# Patient Record
Sex: Female | Born: 1970 | Race: White | Hispanic: No | Marital: Single | Smoking: Current every day smoker
Health system: Southern US, Community
[De-identification: ages and names within clinical notes are randomized; demographics above are authoritative.]

## PROBLEM LIST (undated history)

## (undated) DIAGNOSIS — F329 Major depressive disorder, single episode, unspecified: Secondary | ICD-10-CM

## (undated) DIAGNOSIS — N750 Cyst of Bartholin's gland: Secondary | ICD-10-CM

## (undated) DIAGNOSIS — L0291 Cutaneous abscess, unspecified: Secondary | ICD-10-CM

## (undated) DIAGNOSIS — F32A Depression, unspecified: Secondary | ICD-10-CM

## (undated) DIAGNOSIS — J40 Bronchitis, not specified as acute or chronic: Secondary | ICD-10-CM

## (undated) DIAGNOSIS — F419 Anxiety disorder, unspecified: Secondary | ICD-10-CM

## (undated) HISTORY — DX: Cyst of Bartholin's gland: N75.0

## (undated) HISTORY — PX: WISDOM TOOTH EXTRACTION: SHX21

## (undated) HISTORY — PX: OTHER SURGICAL HISTORY: SHX169

## (undated) HISTORY — PX: CARPAL TUNNEL RELEASE: SHX101

## (undated) HISTORY — PX: TUBAL LIGATION: SHX77

## (undated) HISTORY — PX: LEEP: SHX91

---

## 1997-10-23 ENCOUNTER — Inpatient Hospital Stay (HOSPITAL_COMMUNITY): Admission: AD | Admit: 1997-10-23 | Discharge: 1997-10-23 | Payer: Self-pay | Admitting: *Deleted

## 1997-11-15 ENCOUNTER — Ambulatory Visit (HOSPITAL_COMMUNITY): Admission: RE | Admit: 1997-11-15 | Discharge: 1997-11-15 | Payer: Self-pay | Admitting: Obstetrics

## 1997-12-14 ENCOUNTER — Ambulatory Visit (HOSPITAL_COMMUNITY): Admission: RE | Admit: 1997-12-14 | Discharge: 1997-12-14 | Payer: Self-pay | Admitting: Obstetrics & Gynecology

## 1998-01-22 ENCOUNTER — Ambulatory Visit (HOSPITAL_COMMUNITY): Admission: RE | Admit: 1998-01-22 | Discharge: 1998-01-22 | Payer: Self-pay | Admitting: Obstetrics

## 1998-03-03 ENCOUNTER — Inpatient Hospital Stay (HOSPITAL_COMMUNITY): Admission: AD | Admit: 1998-03-03 | Discharge: 1998-03-06 | Payer: Self-pay | Admitting: Obstetrics

## 1998-03-06 ENCOUNTER — Inpatient Hospital Stay (HOSPITAL_COMMUNITY): Admission: AD | Admit: 1998-03-06 | Discharge: 1998-03-09 | Payer: Self-pay | Admitting: Obstetrics

## 1998-03-14 ENCOUNTER — Inpatient Hospital Stay (HOSPITAL_COMMUNITY): Admission: AD | Admit: 1998-03-14 | Discharge: 1998-03-14 | Payer: Self-pay | Admitting: Obstetrics & Gynecology

## 1998-04-14 ENCOUNTER — Emergency Department (HOSPITAL_COMMUNITY): Admission: EM | Admit: 1998-04-14 | Discharge: 1998-04-14 | Payer: Self-pay | Admitting: Emergency Medicine

## 1998-04-19 ENCOUNTER — Encounter: Payer: Self-pay | Admitting: Emergency Medicine

## 1998-04-19 ENCOUNTER — Emergency Department (HOSPITAL_COMMUNITY): Admission: EM | Admit: 1998-04-19 | Discharge: 1998-04-19 | Payer: Self-pay | Admitting: Emergency Medicine

## 1998-05-26 ENCOUNTER — Emergency Department (HOSPITAL_COMMUNITY): Admission: EM | Admit: 1998-05-26 | Discharge: 1998-05-26 | Payer: Self-pay | Admitting: Emergency Medicine

## 1998-06-03 ENCOUNTER — Emergency Department (HOSPITAL_COMMUNITY): Admission: EM | Admit: 1998-06-03 | Discharge: 1998-06-03 | Payer: Self-pay | Admitting: Emergency Medicine

## 1998-06-10 ENCOUNTER — Emergency Department (HOSPITAL_COMMUNITY): Admission: EM | Admit: 1998-06-10 | Discharge: 1998-06-10 | Payer: Self-pay | Admitting: Emergency Medicine

## 1998-06-19 ENCOUNTER — Emergency Department (HOSPITAL_COMMUNITY): Admission: EM | Admit: 1998-06-19 | Discharge: 1998-06-19 | Payer: Self-pay | Admitting: Emergency Medicine

## 1998-07-05 ENCOUNTER — Encounter: Payer: Self-pay | Admitting: Emergency Medicine

## 1998-07-05 ENCOUNTER — Emergency Department (HOSPITAL_COMMUNITY): Admission: EM | Admit: 1998-07-05 | Discharge: 1998-07-05 | Payer: Self-pay | Admitting: Emergency Medicine

## 1998-07-08 ENCOUNTER — Emergency Department (HOSPITAL_COMMUNITY): Admission: EM | Admit: 1998-07-08 | Discharge: 1998-07-08 | Payer: Self-pay | Admitting: Emergency Medicine

## 1998-07-08 ENCOUNTER — Encounter: Payer: Self-pay | Admitting: Emergency Medicine

## 1998-08-19 ENCOUNTER — Ambulatory Visit (HOSPITAL_COMMUNITY): Admission: RE | Admit: 1998-08-19 | Discharge: 1998-08-19 | Payer: Self-pay | Admitting: General Surgery

## 1998-09-13 ENCOUNTER — Emergency Department (HOSPITAL_COMMUNITY): Admission: EM | Admit: 1998-09-13 | Discharge: 1998-09-13 | Payer: Self-pay | Admitting: Emergency Medicine

## 1998-09-19 ENCOUNTER — Emergency Department (HOSPITAL_COMMUNITY): Admission: EM | Admit: 1998-09-19 | Discharge: 1998-09-19 | Payer: Self-pay | Admitting: Emergency Medicine

## 1998-11-20 ENCOUNTER — Ambulatory Visit (HOSPITAL_COMMUNITY): Admission: RE | Admit: 1998-11-20 | Discharge: 1998-11-20 | Payer: Self-pay | Admitting: *Deleted

## 1998-11-25 ENCOUNTER — Emergency Department (HOSPITAL_COMMUNITY): Admission: EM | Admit: 1998-11-25 | Discharge: 1998-11-25 | Payer: Self-pay | Admitting: Emergency Medicine

## 1998-12-17 ENCOUNTER — Emergency Department (HOSPITAL_COMMUNITY): Admission: EM | Admit: 1998-12-17 | Discharge: 1998-12-17 | Payer: Self-pay | Admitting: Emergency Medicine

## 2001-06-18 ENCOUNTER — Emergency Department (HOSPITAL_COMMUNITY): Admission: EM | Admit: 2001-06-18 | Discharge: 2001-06-18 | Payer: Self-pay | Admitting: Emergency Medicine

## 2001-09-14 ENCOUNTER — Emergency Department (HOSPITAL_COMMUNITY): Admission: EM | Admit: 2001-09-14 | Discharge: 2001-09-14 | Payer: Self-pay | Admitting: Emergency Medicine

## 2003-01-31 ENCOUNTER — Encounter: Payer: Self-pay | Admitting: Emergency Medicine

## 2003-01-31 ENCOUNTER — Emergency Department (HOSPITAL_COMMUNITY): Admission: EM | Admit: 2003-01-31 | Discharge: 2003-01-31 | Payer: Self-pay | Admitting: Emergency Medicine

## 2003-04-24 ENCOUNTER — Emergency Department (HOSPITAL_COMMUNITY): Admission: EM | Admit: 2003-04-24 | Discharge: 2003-04-24 | Payer: Self-pay | Admitting: Emergency Medicine

## 2003-05-01 ENCOUNTER — Emergency Department (HOSPITAL_COMMUNITY): Admission: EM | Admit: 2003-05-01 | Discharge: 2003-05-01 | Payer: Self-pay | Admitting: Emergency Medicine

## 2003-06-30 ENCOUNTER — Emergency Department (HOSPITAL_COMMUNITY): Admission: EM | Admit: 2003-06-30 | Discharge: 2003-06-30 | Payer: Self-pay | Admitting: Emergency Medicine

## 2003-09-12 ENCOUNTER — Emergency Department (HOSPITAL_COMMUNITY): Admission: EM | Admit: 2003-09-12 | Discharge: 2003-09-12 | Payer: Self-pay | Admitting: Emergency Medicine

## 2004-02-11 ENCOUNTER — Emergency Department (HOSPITAL_COMMUNITY): Admission: EM | Admit: 2004-02-11 | Discharge: 2004-02-11 | Payer: Self-pay | Admitting: Emergency Medicine

## 2005-05-10 ENCOUNTER — Emergency Department (HOSPITAL_COMMUNITY): Admission: EM | Admit: 2005-05-10 | Discharge: 2005-05-10 | Payer: Self-pay

## 2005-05-12 ENCOUNTER — Emergency Department (HOSPITAL_COMMUNITY): Admission: EM | Admit: 2005-05-12 | Discharge: 2005-05-12 | Payer: Self-pay | Admitting: *Deleted

## 2005-06-15 ENCOUNTER — Emergency Department (HOSPITAL_COMMUNITY): Admission: EM | Admit: 2005-06-15 | Discharge: 2005-06-15 | Payer: Self-pay | Admitting: Emergency Medicine

## 2005-06-28 ENCOUNTER — Emergency Department (HOSPITAL_COMMUNITY): Admission: EM | Admit: 2005-06-28 | Discharge: 2005-06-28 | Payer: Self-pay | Admitting: Emergency Medicine

## 2005-07-07 ENCOUNTER — Emergency Department (HOSPITAL_COMMUNITY): Admission: EM | Admit: 2005-07-07 | Discharge: 2005-07-07 | Payer: Self-pay | Admitting: Family Medicine

## 2005-07-30 ENCOUNTER — Emergency Department (HOSPITAL_COMMUNITY): Admission: EM | Admit: 2005-07-30 | Discharge: 2005-07-30 | Payer: Self-pay | Admitting: Emergency Medicine

## 2005-10-10 ENCOUNTER — Emergency Department (HOSPITAL_COMMUNITY): Admission: EM | Admit: 2005-10-10 | Discharge: 2005-10-10 | Payer: Self-pay | Admitting: Emergency Medicine

## 2006-02-26 ENCOUNTER — Emergency Department (HOSPITAL_COMMUNITY): Admission: EM | Admit: 2006-02-26 | Discharge: 2006-02-26 | Payer: Self-pay | Admitting: Family Medicine

## 2006-09-21 ENCOUNTER — Emergency Department (HOSPITAL_COMMUNITY): Admission: EM | Admit: 2006-09-21 | Discharge: 2006-09-21 | Payer: Self-pay | Admitting: Emergency Medicine

## 2007-03-10 ENCOUNTER — Emergency Department: Payer: Self-pay | Admitting: Internal Medicine

## 2007-03-31 ENCOUNTER — Emergency Department: Payer: Self-pay | Admitting: Emergency Medicine

## 2007-04-25 ENCOUNTER — Emergency Department (HOSPITAL_COMMUNITY): Admission: EM | Admit: 2007-04-25 | Discharge: 2007-04-25 | Payer: Self-pay | Admitting: Emergency Medicine

## 2007-05-11 ENCOUNTER — Emergency Department: Payer: Self-pay | Admitting: Emergency Medicine

## 2008-09-26 ENCOUNTER — Emergency Department (HOSPITAL_COMMUNITY): Admission: EM | Admit: 2008-09-26 | Discharge: 2008-09-26 | Payer: Self-pay | Admitting: Emergency Medicine

## 2008-10-10 ENCOUNTER — Emergency Department (HOSPITAL_COMMUNITY): Admission: EM | Admit: 2008-10-10 | Discharge: 2008-10-10 | Payer: Self-pay | Admitting: Emergency Medicine

## 2008-10-22 ENCOUNTER — Emergency Department (HOSPITAL_COMMUNITY): Admission: EM | Admit: 2008-10-22 | Discharge: 2008-10-22 | Payer: Self-pay | Admitting: Emergency Medicine

## 2008-11-10 ENCOUNTER — Emergency Department (HOSPITAL_COMMUNITY): Admission: EM | Admit: 2008-11-10 | Discharge: 2008-11-10 | Payer: Self-pay | Admitting: Emergency Medicine

## 2008-11-13 ENCOUNTER — Emergency Department (HOSPITAL_COMMUNITY): Admission: EM | Admit: 2008-11-13 | Discharge: 2008-11-13 | Payer: Self-pay | Admitting: Emergency Medicine

## 2008-12-11 ENCOUNTER — Emergency Department (HOSPITAL_COMMUNITY): Admission: EM | Admit: 2008-12-11 | Discharge: 2008-12-11 | Payer: Self-pay | Admitting: Emergency Medicine

## 2009-01-16 ENCOUNTER — Inpatient Hospital Stay (HOSPITAL_COMMUNITY): Admission: AD | Admit: 2009-01-16 | Discharge: 2009-01-16 | Payer: Self-pay | Admitting: Obstetrics & Gynecology

## 2009-01-16 ENCOUNTER — Emergency Department (HOSPITAL_COMMUNITY): Admission: EM | Admit: 2009-01-16 | Discharge: 2009-01-16 | Payer: Self-pay | Admitting: Emergency Medicine

## 2009-01-16 ENCOUNTER — Ambulatory Visit: Payer: Self-pay | Admitting: Obstetrics and Gynecology

## 2009-01-26 ENCOUNTER — Emergency Department (HOSPITAL_COMMUNITY): Admission: EM | Admit: 2009-01-26 | Discharge: 2009-01-26 | Payer: Self-pay | Admitting: Emergency Medicine

## 2009-01-30 ENCOUNTER — Ambulatory Visit: Payer: Self-pay | Admitting: Obstetrics and Gynecology

## 2009-02-16 ENCOUNTER — Emergency Department (HOSPITAL_COMMUNITY): Admission: EM | Admit: 2009-02-16 | Discharge: 2009-02-16 | Payer: Self-pay | Admitting: Emergency Medicine

## 2009-03-29 ENCOUNTER — Inpatient Hospital Stay (HOSPITAL_COMMUNITY): Admission: AD | Admit: 2009-03-29 | Discharge: 2009-03-29 | Payer: Self-pay | Admitting: Obstetrics & Gynecology

## 2009-06-08 ENCOUNTER — Emergency Department: Payer: Self-pay | Admitting: Internal Medicine

## 2009-06-10 ENCOUNTER — Emergency Department: Payer: Self-pay | Admitting: Emergency Medicine

## 2009-06-26 ENCOUNTER — Emergency Department (HOSPITAL_COMMUNITY): Admission: EM | Admit: 2009-06-26 | Discharge: 2009-06-26 | Payer: Self-pay | Admitting: Emergency Medicine

## 2009-08-03 ENCOUNTER — Emergency Department (HOSPITAL_COMMUNITY): Admission: EM | Admit: 2009-08-03 | Discharge: 2009-08-03 | Payer: Self-pay | Admitting: Emergency Medicine

## 2009-08-06 ENCOUNTER — Emergency Department (HOSPITAL_COMMUNITY): Admission: EM | Admit: 2009-08-06 | Discharge: 2009-08-06 | Payer: Self-pay | Admitting: Emergency Medicine

## 2009-08-08 ENCOUNTER — Emergency Department (HOSPITAL_COMMUNITY): Admission: EM | Admit: 2009-08-08 | Discharge: 2009-08-08 | Payer: Self-pay | Admitting: Emergency Medicine

## 2009-08-21 ENCOUNTER — Emergency Department (HOSPITAL_COMMUNITY): Admission: EM | Admit: 2009-08-21 | Discharge: 2009-08-21 | Payer: Self-pay | Admitting: Emergency Medicine

## 2009-09-11 ENCOUNTER — Ambulatory Visit: Payer: Self-pay | Admitting: Obstetrics & Gynecology

## 2009-12-12 ENCOUNTER — Emergency Department (HOSPITAL_COMMUNITY): Admission: EM | Admit: 2009-12-12 | Discharge: 2009-12-12 | Payer: Self-pay | Admitting: Emergency Medicine

## 2010-03-06 ENCOUNTER — Emergency Department (HOSPITAL_COMMUNITY): Admission: EM | Admit: 2010-03-06 | Discharge: 2010-03-06 | Payer: Self-pay | Admitting: Emergency Medicine

## 2010-03-14 ENCOUNTER — Emergency Department (HOSPITAL_COMMUNITY): Admission: EM | Admit: 2010-03-14 | Discharge: 2010-03-14 | Payer: Self-pay | Admitting: Emergency Medicine

## 2010-10-10 ENCOUNTER — Ambulatory Visit: Payer: Self-pay | Admitting: Obstetrics and Gynecology

## 2010-10-10 ENCOUNTER — Emergency Department (HOSPITAL_COMMUNITY)
Admission: EM | Admit: 2010-10-10 | Discharge: 2010-10-10 | Disposition: A | Discharge status: Home / Self Care | Payer: Self-pay | Attending: Emergency Medicine | Admitting: Emergency Medicine

## 2010-10-10 DIAGNOSIS — N949 Unspecified condition associated with female genital organs and menstrual cycle: Secondary | ICD-10-CM | POA: Insufficient documentation

## 2010-10-10 DIAGNOSIS — F329 Major depressive disorder, single episode, unspecified: Secondary | ICD-10-CM | POA: Insufficient documentation

## 2010-10-10 DIAGNOSIS — J45909 Unspecified asthma, uncomplicated: Secondary | ICD-10-CM | POA: Insufficient documentation

## 2010-10-10 DIAGNOSIS — N751 Abscess of Bartholin's gland: Secondary | ICD-10-CM | POA: Insufficient documentation

## 2010-10-10 DIAGNOSIS — F3289 Other specified depressive episodes: Secondary | ICD-10-CM | POA: Insufficient documentation

## 2010-10-10 DIAGNOSIS — N9489 Other specified conditions associated with female genital organs and menstrual cycle: Secondary | ICD-10-CM | POA: Insufficient documentation

## 2010-10-13 ENCOUNTER — Emergency Department (HOSPITAL_COMMUNITY)
Admission: EM | Admit: 2010-10-13 | Discharge: 2010-10-13 | Discharge status: Against Medical Advice | Payer: Self-pay | Attending: Emergency Medicine | Admitting: Emergency Medicine

## 2010-10-13 DIAGNOSIS — R109 Unspecified abdominal pain: Secondary | ICD-10-CM | POA: Insufficient documentation

## 2010-10-17 ENCOUNTER — Inpatient Hospital Stay (HOSPITAL_COMMUNITY)
Admission: AD | Admit: 2010-10-17 | Discharge: 2010-10-17 | Disposition: A | Discharge status: Home / Self Care | Payer: Self-pay | Source: Ambulatory Visit | Attending: Obstetrics & Gynecology | Admitting: Obstetrics & Gynecology

## 2010-10-17 DIAGNOSIS — N751 Abscess of Bartholin's gland: Secondary | ICD-10-CM

## 2010-11-01 LAB — URINALYSIS, ROUTINE W REFLEX MICROSCOPIC
Bilirubin Urine: NEGATIVE
Glucose, UA: NEGATIVE mg/dL
Hgb urine dipstick: NEGATIVE
Ketones, ur: NEGATIVE mg/dL
Nitrite: NEGATIVE
Protein, ur: NEGATIVE mg/dL
Specific Gravity, Urine: 1.028 (ref 1.005–1.030)
Urobilinogen, UA: 0.2 mg/dL (ref 0.0–1.0)
pH: 6 (ref 5.0–8.0)

## 2010-11-01 LAB — WOUND CULTURE: Culture: NO GROWTH

## 2010-11-01 LAB — POCT PREGNANCY, URINE: Preg Test, Ur: NEGATIVE

## 2010-11-02 LAB — GC/CHLAMYDIA PROBE AMP, GENITAL
Chlamydia, DNA Probe: NEGATIVE
GC Probe Amp, Genital: NEGATIVE

## 2010-11-02 LAB — WET PREP, GENITAL
Clue Cells Wet Prep HPF POC: NONE SEEN
Yeast Wet Prep HPF POC: NONE SEEN

## 2010-11-02 LAB — DIFFERENTIAL
Basophils Absolute: 0.1 K/uL (ref 0.0–0.1)
Basophils Relative: 1 % (ref 0–1)
Eosinophils Absolute: 0.1 K/uL (ref 0.0–0.7)
Eosinophils Relative: 1 % (ref 0–5)
Lymphocytes Relative: 36 % (ref 12–46)
Lymphs Abs: 5 K/uL — ABNORMAL HIGH (ref 0.7–4.0)
Monocytes Absolute: 0.8 K/uL (ref 0.1–1.0)
Monocytes Relative: 6 % (ref 3–12)
Neutro Abs: 8 K/uL — ABNORMAL HIGH (ref 1.7–7.7)
Neutrophils Relative %: 57 % (ref 43–77)

## 2010-11-02 LAB — CBC
HCT: 42.7 % (ref 36.0–46.0)
Hemoglobin: 14.6 g/dL (ref 12.0–15.0)
MCHC: 34.2 g/dL (ref 30.0–36.0)
MCV: 94.5 fL (ref 78.0–100.0)
Platelets: 258 K/uL (ref 150–400)
RBC: 4.52 MIL/uL (ref 3.87–5.11)
RDW: 13.7 % (ref 11.5–15.5)
WBC: 14 K/uL — ABNORMAL HIGH (ref 4.0–10.5)

## 2010-11-02 LAB — POCT I-STAT, CHEM 8
BUN: 5 mg/dL — ABNORMAL LOW (ref 6–23)
Calcium, Ion: 1.15 mmol/L (ref 1.12–1.32)
Chloride: 104 mEq/L (ref 96–112)
Creatinine, Ser: 0.5 mg/dL (ref 0.4–1.2)
Glucose, Bld: 87 mg/dL (ref 70–99)
HCT: 44 % (ref 36.0–46.0)
Hemoglobin: 15 g/dL (ref 12.0–15.0)
Potassium: 3.4 mEq/L — ABNORMAL LOW (ref 3.5–5.1)
Sodium: 140 mEq/L (ref 135–145)
TCO2: 30 mmol/L (ref 0–100)

## 2010-11-02 LAB — URINALYSIS, ROUTINE W REFLEX MICROSCOPIC
Bilirubin Urine: NEGATIVE
Glucose, UA: NEGATIVE mg/dL
Ketones, ur: NEGATIVE mg/dL
Nitrite: NEGATIVE
Protein, ur: NEGATIVE mg/dL
Specific Gravity, Urine: 1.013 (ref 1.005–1.030)
Urobilinogen, UA: 0.2 mg/dL (ref 0.0–1.0)
pH: 7 (ref 5.0–8.0)

## 2010-11-02 LAB — URINE MICROSCOPIC-ADD ON

## 2010-11-02 LAB — RPR: RPR Ser Ql: NONREACTIVE

## 2010-11-02 LAB — POCT PREGNANCY, URINE: Preg Test, Ur: NEGATIVE

## 2010-11-14 ENCOUNTER — Encounter: Payer: Self-pay | Admitting: Obstetrics & Gynecology

## 2010-11-17 LAB — URINE MICROSCOPIC-ADD ON

## 2010-11-17 LAB — URINALYSIS, ROUTINE W REFLEX MICROSCOPIC
Glucose, UA: NEGATIVE mg/dL
Nitrite: NEGATIVE
Protein, ur: NEGATIVE mg/dL
Specific Gravity, Urine: 1.029 (ref 1.005–1.030)
Urobilinogen, UA: 1 mg/dL (ref 0.0–1.0)
pH: 6 (ref 5.0–8.0)

## 2010-11-19 LAB — URINALYSIS, ROUTINE W REFLEX MICROSCOPIC
Bilirubin Urine: NEGATIVE
Glucose, UA: NEGATIVE mg/dL
Hgb urine dipstick: NEGATIVE
Ketones, ur: NEGATIVE mg/dL
Nitrite: NEGATIVE
Protein, ur: NEGATIVE mg/dL
Specific Gravity, Urine: 1.006 (ref 1.005–1.030)
Urobilinogen, UA: 0.2 mg/dL (ref 0.0–1.0)
pH: 7.5 (ref 5.0–8.0)

## 2010-11-19 LAB — POCT I-STAT, CHEM 8
BUN: 6 mg/dL (ref 6–23)
Calcium, Ion: 1.09 mmol/L — ABNORMAL LOW (ref 1.12–1.32)
Chloride: 104 mEq/L (ref 96–112)
Creatinine, Ser: 0.6 mg/dL (ref 0.4–1.2)
Glucose, Bld: 88 mg/dL (ref 70–99)
HCT: 44 % (ref 36.0–46.0)
Hemoglobin: 15 g/dL (ref 12.0–15.0)
Potassium: 4.5 mEq/L (ref 3.5–5.1)
Sodium: 138 mEq/L (ref 135–145)
TCO2: 25 mmol/L (ref 0–100)

## 2010-11-19 LAB — PREGNANCY, URINE: Preg Test, Ur: NEGATIVE

## 2010-11-22 LAB — URINALYSIS, ROUTINE W REFLEX MICROSCOPIC
Bilirubin Urine: NEGATIVE
Glucose, UA: NEGATIVE mg/dL
Hgb urine dipstick: NEGATIVE
Ketones, ur: 15 mg/dL — AB
Nitrite: NEGATIVE
Protein, ur: NEGATIVE mg/dL
Specific Gravity, Urine: 1.025 (ref 1.005–1.030)
Urobilinogen, UA: 0.2 mg/dL (ref 0.0–1.0)
pH: 5.5 (ref 5.0–8.0)

## 2010-11-22 LAB — CBC
HCT: 39.3 % (ref 36.0–46.0)
Hemoglobin: 13.5 g/dL (ref 12.0–15.0)
MCHC: 34.3 g/dL (ref 30.0–36.0)
MCV: 94.6 fL (ref 78.0–100.0)
Platelets: 240 10*3/uL (ref 150–400)
RBC: 4.15 MIL/uL (ref 3.87–5.11)
RDW: 13.3 % (ref 11.5–15.5)
WBC: 8.1 10*3/uL (ref 4.0–10.5)

## 2010-11-22 LAB — WET PREP, GENITAL
Trich, Wet Prep: NONE SEEN
Yeast Wet Prep HPF POC: NONE SEEN

## 2010-11-22 LAB — POCT PREGNANCY, URINE: Preg Test, Ur: NEGATIVE

## 2010-11-22 LAB — GC/CHLAMYDIA PROBE AMP, GENITAL
Chlamydia, DNA Probe: POSITIVE — AB
GC Probe Amp, Genital: NEGATIVE

## 2010-11-26 LAB — URINALYSIS, ROUTINE W REFLEX MICROSCOPIC
Bilirubin Urine: NEGATIVE
Glucose, UA: NEGATIVE mg/dL
Hgb urine dipstick: NEGATIVE
Ketones, ur: NEGATIVE mg/dL
Nitrite: NEGATIVE
Protein, ur: NEGATIVE mg/dL
Specific Gravity, Urine: 1.015 (ref 1.005–1.030)
Urobilinogen, UA: 0.2 mg/dL (ref 0.0–1.0)
pH: 6.5 (ref 5.0–8.0)

## 2010-11-26 LAB — GC/CHLAMYDIA PROBE AMP, GENITAL
Chlamydia, DNA Probe: NEGATIVE
GC Probe Amp, Genital: NEGATIVE

## 2010-11-26 LAB — WET PREP, GENITAL
Trich, Wet Prep: NONE SEEN
WBC, Wet Prep HPF POC: NONE SEEN
Yeast Wet Prep HPF POC: NONE SEEN

## 2010-11-26 LAB — POCT PREGNANCY, URINE: Preg Test, Ur: NEGATIVE

## 2010-11-27 ENCOUNTER — Encounter: Payer: Self-pay | Admitting: Obstetrics and Gynecology

## 2010-12-08 ENCOUNTER — Encounter: Payer: Self-pay | Admitting: Family Medicine

## 2010-12-08 ENCOUNTER — Inpatient Hospital Stay (HOSPITAL_COMMUNITY)
Admission: AD | Admit: 2010-12-08 | Discharge: 2010-12-08 | Disposition: A | Discharge status: Home / Self Care | Payer: Self-pay | Source: Ambulatory Visit | Attending: Obstetrics & Gynecology | Admitting: Obstetrics & Gynecology

## 2010-12-08 DIAGNOSIS — L02219 Cutaneous abscess of trunk, unspecified: Secondary | ICD-10-CM

## 2010-12-08 DIAGNOSIS — L03319 Cellulitis of trunk, unspecified: Secondary | ICD-10-CM | POA: Insufficient documentation

## 2010-12-10 ENCOUNTER — Observation Stay (HOSPITAL_COMMUNITY)
Admission: EM | Admit: 2010-12-10 | Discharge: 2010-12-10 | Disposition: A | Discharge status: Home / Self Care | Payer: Self-pay | Attending: Emergency Medicine | Admitting: Emergency Medicine

## 2010-12-10 DIAGNOSIS — F17200 Nicotine dependence, unspecified, uncomplicated: Secondary | ICD-10-CM | POA: Insufficient documentation

## 2010-12-10 DIAGNOSIS — L02219 Cutaneous abscess of trunk, unspecified: Principal | ICD-10-CM | POA: Insufficient documentation

## 2010-12-10 LAB — BASIC METABOLIC PANEL
BUN: 6 mg/dL (ref 6–23)
CO2: 26 mEq/L (ref 19–32)
Calcium: 8.8 mg/dL (ref 8.4–10.5)
Chloride: 109 mEq/L (ref 96–112)
Creatinine, Ser: 0.52 mg/dL (ref 0.4–1.2)
GFR calc Af Amer: >60 mL/min (ref >60)
GFR calc non Af Amer: >60 mL/min (ref >60)
Glucose, Bld: 85 mg/dL (ref 70–99)
Potassium: 4.1 mEq/L (ref 3.5–5.1)
Sodium: 139 mEq/L (ref 135–145)

## 2010-12-10 LAB — CBC
HCT: 40.2 % (ref 36.0–46.0)
Hemoglobin: 13.5 g/dL (ref 12.0–15.0)
MCH: 31.5 pg (ref 26.0–34.0)
MCHC: 33.6 g/dL (ref 30.0–36.0)
MCV: 93.7 fL (ref 78.0–100.0)
Platelets: 249 10*3/uL (ref 150–400)
RBC: 4.29 MIL/uL (ref 3.87–5.11)
RDW: 13.3 % (ref 11.5–15.5)
WBC: 8.9 10*3/uL (ref 4.0–10.5)

## 2010-12-10 LAB — DIFFERENTIAL
Basophils Absolute: 0 10*3/uL (ref 0.0–0.1)
Basophils Relative: 1 % (ref 0–1)
Eosinophils Absolute: 0.2 10*3/uL (ref 0.0–0.7)
Eosinophils Relative: 2 % (ref 0–5)
Lymphocytes Relative: 40 % (ref 12–46)
Lymphs Abs: 3.5 10*3/uL (ref 0.7–4.0)
Monocytes Absolute: 0.8 10*3/uL (ref 0.1–1.0)
Monocytes Relative: 9 % (ref 3–12)
Neutro Abs: 4.4 10*3/uL (ref 1.7–7.7)
Neutrophils Relative %: 49 % (ref 43–77)

## 2010-12-12 LAB — CULTURE, ROUTINE-ABSCESS

## 2010-12-13 LAB — ANAEROBIC CULTURE

## 2010-12-22 ENCOUNTER — Encounter: Payer: Self-pay | Admitting: Obstetrics and Gynecology

## 2010-12-30 NOTE — Group Therapy Note (Signed)
NAME:  Jill Mcfarland, Jill Mcfarland                ACCOUNT NO.:  [id number]   MEDICAL RECORD NO.:  [id number]          PATIENT TYPE:  WOC   LOCATION:  [facility]                   FACILITY:  [facility]   PHYSICIAN:  Caren Griffins, CNM       DATE OF BIRTH:  [birth year]   DATE OF SERVICE:  01/30/2009                                  CLINIC NOTE   REASON FOR VISIT:  Follow-up left Bartholin's gland abscess.   HISTORY:  The patient states that she has had recurrent Bartholin's  gland abscess for several years.  It has been incision and drained at  least four times that she remembers, the most recent was December 11, 2008  and in review of that note it states that it was 1.3 cm.  The patient  reports that an attempt was made to place a Word catheter at that time  but it was unsuccessful.  She was advised to have her gland removed or  to have it laid open.  She is at this point still having pain which  she controls with Tylenol and has had no drainage.  She states that it  is smaller now and softer than it was when she was last seen for the I  and D procedure.  She also states her cultures were negative at that  time and her last Pap was normal in November 2009.  She is frustrated  and would like some definitive treatment.   OBJECTIVE:  Blood pressure is not documented, weight is 124 pounds.  She  is in no distress.   ABBREVIATED PHYSICAL EXAM:  ABDOMEN:  Soft, flat, nontender, no masses.  PELVIC:  There is a 1-1/2 cm soft, nonfluctuant, nonfluctuant, minimally  tender lesion with healed 5 mm scar from I and D.  Pelvic exam is  deferred as she had one less than 2 months ago.   ASSESSMENT:  Healed recurrent left Bartholin's abscess.   PLAN:  Discussed with both Dr. Okey Dupre and Dr. Perlie Gold who both agree that  nothing should be done at this point, however she is advised to come in  when the lesion is at least 1-1/2 to 2 inches and tense or draining.  At  that point her best bet would be to have them place a Word  catheter and  to leave that in for up to a couple of weeks.  Marsupialization could be  an option, however it is explained to the patient that removal of the  gland is not indicated and is problematic for complications.  She is  offered prescription for her pain medication however she has financial  problems and would prefer to take over-the-counter ibuprofen 600 mg q.6  hours and can alternate with Tylenol if needed.  Since she is not  motivated for smoking cessation at this time just advised her again that  it is in her best interest to cut down and eventually quit smoking.   GYN history form is reviewed. It was completed after the patient was out  of the room.  She is advised to come to GYN clinic when she has an  active  inflammation of the Bartholin's abscess and will be worked in for  evaluation at that time.           ______________________________  Caren Griffins, CNM    DP/MEDQ  D:  01/30/2009  T:  01/30/2009  Job:  [id number]

## 2011-01-28 ENCOUNTER — Emergency Department: Payer: Self-pay | Admitting: Emergency Medicine

## 2011-01-29 ENCOUNTER — Emergency Department (HOSPITAL_COMMUNITY)
Admission: EM | Admit: 2011-01-29 | Discharge: 2011-01-29 | Disposition: A | Discharge status: Home / Self Care | Payer: Self-pay | Attending: Emergency Medicine | Admitting: Emergency Medicine

## 2011-01-29 DIAGNOSIS — N9489 Other specified conditions associated with female genital organs and menstrual cycle: Secondary | ICD-10-CM | POA: Insufficient documentation

## 2011-01-29 DIAGNOSIS — N751 Abscess of Bartholin's gland: Secondary | ICD-10-CM | POA: Insufficient documentation

## 2011-01-29 DIAGNOSIS — F341 Dysthymic disorder: Secondary | ICD-10-CM | POA: Insufficient documentation

## 2011-01-29 DIAGNOSIS — N949 Unspecified condition associated with female genital organs and menstrual cycle: Secondary | ICD-10-CM | POA: Insufficient documentation

## 2011-02-14 DIAGNOSIS — N750 Cyst of Bartholin's gland: Secondary | ICD-10-CM | POA: Insufficient documentation

## 2011-02-14 DIAGNOSIS — L0292 Furuncle, unspecified: Secondary | ICD-10-CM

## 2011-02-14 DIAGNOSIS — A599 Trichomoniasis, unspecified: Secondary | ICD-10-CM | POA: Insufficient documentation

## 2011-02-26 ENCOUNTER — Ambulatory Visit: Payer: Self-pay | Admitting: Obstetrics and Gynecology

## 2011-04-30 ENCOUNTER — Emergency Department: Payer: Self-pay | Admitting: Emergency Medicine

## 2011-05-28 ENCOUNTER — Emergency Department (HOSPITAL_COMMUNITY)
Admission: EM | Admit: 2011-05-28 | Discharge: 2011-05-28 | Disposition: A | Discharge status: Home / Self Care | Payer: Self-pay | Attending: Emergency Medicine | Admitting: Emergency Medicine

## 2011-05-28 DIAGNOSIS — F341 Dysthymic disorder: Secondary | ICD-10-CM | POA: Insufficient documentation

## 2011-05-28 DIAGNOSIS — N949 Unspecified condition associated with female genital organs and menstrual cycle: Secondary | ICD-10-CM | POA: Insufficient documentation

## 2011-05-28 DIAGNOSIS — Z9889 Other specified postprocedural states: Secondary | ICD-10-CM | POA: Insufficient documentation

## 2011-05-28 DIAGNOSIS — L723 Sebaceous cyst: Secondary | ICD-10-CM | POA: Insufficient documentation

## 2011-05-28 LAB — GLUCOSE, CAPILLARY: Glucose-Capillary: 121 mg/dL — ABNORMAL HIGH (ref 70–99)

## 2011-07-13 ENCOUNTER — Emergency Department: Payer: Self-pay | Admitting: Emergency Medicine

## 2011-10-08 ENCOUNTER — Emergency Department (HOSPITAL_COMMUNITY)
Admission: EM | Admit: 2011-10-08 | Discharge: 2011-10-08 | Discharge status: Against Medical Advice | Payer: Self-pay | Attending: Emergency Medicine | Admitting: Emergency Medicine

## 2011-10-08 ENCOUNTER — Encounter (HOSPITAL_COMMUNITY): Payer: Self-pay

## 2011-10-08 DIAGNOSIS — Z0389 Encounter for observation for other suspected diseases and conditions ruled out: Secondary | ICD-10-CM | POA: Insufficient documentation

## 2011-10-08 HISTORY — DX: Cutaneous abscess, unspecified: L02.91

## 2011-10-08 NOTE — ED Notes (Signed)
Pt states that she has a left labial abscess that keeps on returning. She states that she has been having it lanced (last time being July of last year) but it keeps returning. She states that the pain has been increasing for the past couple of days. Alert and oriented. Ambulatory to dept.

## 2011-10-08 NOTE — ED Notes (Signed)
See previous nurses note 

## 2011-10-17 ENCOUNTER — Emergency Department: Payer: Self-pay | Admitting: Emergency Medicine

## 2011-10-23 ENCOUNTER — Emergency Department: Payer: Self-pay | Admitting: Emergency Medicine

## 2011-10-26 LAB — WOUND AEROBIC CULTURE

## 2011-10-28 ENCOUNTER — Telehealth: Payer: Self-pay | Admitting: *Deleted

## 2011-10-28 NOTE — Telephone Encounter (Signed)
Called and spoke w/pt. She states she is concerned about her recurring Bartholin's cyst and needing possible surgery. She does not want to wait until 4/3 as scheduled because it is swollen again now. She is also concerned about the cost of her care. I explained that she may apply for financial assistance and if she qualifies, will have a discount to her bill. I advised pt to obtain the application @ the time of her appt.  I was able to re-schedule pt's appt to tomorrow @ 1245. Pt agreed to appt change and voiced understanding of all information given.

## 2011-10-28 NOTE — Telephone Encounter (Signed)
Patient  Called and  Left a message wants to talk to a nurse about a procedure she is going to have done here.  States" I hope I'll get it done soon- I want to talk to a nurse about it"

## 2011-10-29 ENCOUNTER — Ambulatory Visit (INDEPENDENT_AMBULATORY_CARE_PROVIDER_SITE_OTHER): Payer: Self-pay | Admitting: Obstetrics and Gynecology

## 2011-10-29 ENCOUNTER — Encounter: Payer: Self-pay | Admitting: Obstetrics and Gynecology

## 2011-10-29 VITALS — BP 116/84 | HR 97 | Temp 98.4°F | Ht 62.0 in | Wt 145.9 lb

## 2011-10-29 DIAGNOSIS — N750 Cyst of Bartholin's gland: Secondary | ICD-10-CM

## 2011-10-29 MED ORDER — OXYCODONE-ACETAMINOPHEN 5-325 MG PO TABS: 1.0000 | ORAL_TABLET | ORAL | Status: DC | PRN

## 2011-10-29 NOTE — Progress Notes (Signed)
Has had bartholin cysts before and keeps coming back. States went to [facility] twice recently and they lanced it.

## 2011-10-29 NOTE — Progress Notes (Signed)
  Subjective:    Patient ID: Jill Mcfarland, female    DOB: September 29, 1970, 41 y.o.   MRN: [id number]  HPI 41 yo [id number] with LMP 09/21/2011 presenting today for management of recurrent Bartholin abscess. Patient reports being seen in [place] ED for I&D of left bartholin cyst. Patient has had I&D in 10/2010 at [facility] of left bartholin abscess. Patient presents today requesting surgical intervention. Patient states that after I&D 2 weeks ago, the cyst returned almost immediately. Patient was scheduled a year ago for Marsupialization but the abscess had resolved.   Past Medical History  Diagnosis Date  . Abscess   . Bartholin cyst    Past Surgical History  Procedure Date  . Tubal ligation   . Leep   . Incison and drainage     Bartholin cyst   Jill Mcfarland does not currently have medications on file.  History  Substance Use Topics  . Smoking status: Current Everyday Smoker -- 0.5 packs/day  . Smokeless tobacco: Never Used  . Alcohol Use: No    Review of Systems  All other systems reviewed and are negative.       Objective:   Physical Exam  GENERAL: Well-developed, well-nourished female in no acute distress.  PELVIC: External female genitalia with 5 cm left bartholin abscess, not draining with evidence of recent I&D .  EXTREMITIES: No cyanosis, clubbing, or edema, 2+ distal pulses.     Assessment & Plan:  41 yo [id number] with left bartholin abscess.  - Given recurrence of abscess, patient will be scheduled for marsupialization. Risk, benefits and alternatives were explained including but not limited to risk of bleeding, infection. - Given the size and current patient discomfort, patient offered I&D today but declined due to fear that it may not be present at the time of the surgical procedure. - Patient advised to complete prescription of keflex. Rx Percocet provided. Patient also advised to continue conservative care with warm compresses.

## 2011-11-01 ENCOUNTER — Encounter (HOSPITAL_COMMUNITY): Payer: Self-pay | Admitting: *Deleted

## 2011-11-01 ENCOUNTER — Emergency Department: Payer: Self-pay | Admitting: Internal Medicine

## 2011-11-01 ENCOUNTER — Emergency Department (HOSPITAL_COMMUNITY)
Admission: EM | Admit: 2011-11-01 | Discharge: 2011-11-01 | Disposition: A | Discharge status: Home / Self Care | Payer: Self-pay | Attending: Emergency Medicine | Admitting: Emergency Medicine

## 2011-11-01 DIAGNOSIS — F17200 Nicotine dependence, unspecified, uncomplicated: Secondary | ICD-10-CM | POA: Insufficient documentation

## 2011-11-01 DIAGNOSIS — N750 Cyst of Bartholin's gland: Secondary | ICD-10-CM | POA: Insufficient documentation

## 2011-11-01 MED ORDER — HYDROCODONE-ACETAMINOPHEN 5-325 MG PO TABS
2.0000 | ORAL_TABLET | Freq: Once | ORAL | Status: AC
  Administered 2011-11-01: 2 via ORAL
  Filled 2011-11-01: qty 2

## 2011-11-01 MED ORDER — HYDROCODONE-ACETAMINOPHEN 5-500 MG PO TABS: 1.0000 | ORAL_TABLET | Freq: Four times a day (QID) | ORAL | Status: AC | PRN

## 2011-11-01 NOTE — ED Notes (Signed)
Pt states that she has a hx of cysts. Pt states that this current cyst has been recurrent for years on her left . States that this cyst is on her left vaginal area. States that she is currently taking antibiotics but states that the pain is increasing and she is scheduled to have surgery in April to remove it.

## 2011-11-01 NOTE — ED Provider Notes (Signed)
History     CSN: [id number]  Arrival date & time 11/01/11  1350   First MD Initiated Contact with Patient 11/01/11 1535      Chief Complaint  Patient presents with  . Cyst    (Consider location/radiation/quality/duration/timing/severity/associated sxs/prior treatment) HPI Comments: Has bartholin's cyst which is scheduled to be surgically corrected next month.  It is now more swollen and painful.  No fevers or chills.  The history is provided by the patient.    Past Medical History  Diagnosis Date  . Abscess   . Bartholin cyst     Past Surgical History  Procedure Date  . Tubal ligation   . Leep   . Incison and drainage     Bartholin cyst    Family History  Problem Relation Age of Onset  . Cancer Mother   . Asthma Son     History  Substance Use Topics  . Smoking status: Current Everyday Smoker -- 0.5 packs/day  . Smokeless tobacco: Never Used  . Alcohol Use: No    OB History    Grav Para Term Preterm Abortions TAB SAB Ect Mult Living   4 3 3  1 1    2       Review of Systems  All other systems reviewed and are negative.    Allergies  Review of patient's allergies indicates no known allergies.  Home Medications   Current Outpatient Rx  Name Route Sig Dispense Refill  . BC HEADACHE POWDER PO Oral Take 1 Package by mouth 2 (two) times daily as needed. For headache    . CEPHALEXIN 500 MG PO CAPS Oral Take 500 mg by mouth 4 (four) times daily.      BP 106/79  Pulse 105  Temp(Src) 98.1 F (36.7 C) (Oral)  Resp 18  SpO2 95%  LMP 10/25/2011  Physical Exam  Nursing note and vitals reviewed. Constitutional: She is oriented to person, place, and time. She appears well-developed and well-nourished.  HENT:  Head: Normocephalic and atraumatic.  Neck: Normal range of motion. Neck supple.  Abdominal: Soft. Bowel sounds are normal.  Neurological: She is alert and oriented to person, place, and time.  Skin: Skin is warm.    ED Course  Procedures  (including critical care time)  Labs Reviewed - No data to display No results found.   No diagnosis found.  INCISION AND DRAINAGE Performed by: Geoffery Lyons Consent: Verbal consent obtained. Risks and benefits: risks, benefits and alternatives were discussed Type: abscess  Body area: left labia   Anesthesia: local infiltration  Local anesthetic: lidocaine 2% without epinephrine  Anesthetic total: 2 ml  Complexity: complex Blunt dissection to break up loculations  Drainage: purulent  Drainage amount: large  Packing material: 1/4 in iodoform gauze  Patient tolerance: Patient tolerated the procedure well with no immediate complications.     MDM          Geoffery Lyons, MD 11/01/11 [phone]

## 2011-11-01 NOTE — ED Notes (Signed)
Reports having a large cyst on vaginal area, is suppose to have surgery soon but it isnt scheduled yet and having severe pain.

## 2011-11-01 NOTE — Discharge Instructions (Signed)
Bartholin's Cyst and Abscess  Bartholin's glands produce mucus through small openings just outside the opening of the vagina. The mucus helps with lubrication around the vagina during sexual intercourse. If the duct becomes clogged, the gland will swell and cause a bulge on the inside of the vagina. If this becomes big enough, it can be seen and felt on the outside of the vagina as well. Sometimes, the swelling will shrink away by itself. However, if the cyst becomes infected, the Bartholin's cyst fills with pus and becomes more swollen, red and painful and becomes a Bartholin's abscess. This usually requires antibiotic treatment and surgical drainage. Sometimes, with minor surgery under local anesthesia, a small tube is placed in the cyst or abscess wall. This allows continued drainage for up to 6 weeks. Minor surgery can make a new opening to replace the clogged duct and help prevent future cysts or abscess.  If the abscess occurs several times, a minor operation with local anesthesia is necessary to remove the Bartholin's gland completely or to make it drain better. Cutting open the gland and suturing the edges to make the opening of the gland bigger (marsupialization) may be needed and should usually be done by your obstetrician-gyncology physician. Antibiotics are usually prescribed for this condition. Take all antibiotics as prescribed. Make sure to finish them even if you are doing better. Take warm sitz baths for 20 minutes, 3 times a day. See your caregiver for follow-up care as recommended.  SEEK MEDICAL CARE IF:    You have increasing pain, swelling, or redness near the vagina.   You have vomiting or inability to tolerate medicines.   You have a fever.   You have uncontrolled bleeding from the vagina.  Document Released: 09/10/2004 Document Revised: 07/23/2011 Document Reviewed: 09/13/2009  [employer] Patient Information 2012 [employer].

## 2011-11-04 ENCOUNTER — Emergency Department: Payer: Self-pay | Admitting: Unknown Physician Specialty

## 2011-11-05 ENCOUNTER — Telehealth: Payer: Self-pay | Admitting: Obstetrics and Gynecology

## 2011-11-05 ENCOUNTER — Other Ambulatory Visit: Payer: Self-pay | Admitting: Obstetrics and Gynecology

## 2011-11-05 ENCOUNTER — Encounter (HOSPITAL_COMMUNITY): Payer: Self-pay | Admitting: *Deleted

## 2011-11-05 MED ORDER — OXYCODONE-ACETAMINOPHEN 5-325 MG PO TABS: 1.0000 | ORAL_TABLET | ORAL | Status: DC | PRN

## 2011-11-05 NOTE — Progress Notes (Signed)
Patient requesting pain med until her marsupial surgery procedure on 12/03/11 @3 :30pm. Per Diedre Poe, [facility] Re-order Percocet 5-325 every 4 hours as needed for pain #30 no refills. Pt. Notified to pick up Rx at front desk. Patient agrees.

## 2011-11-16 ENCOUNTER — Telehealth: Payer: Self-pay | Admitting: *Deleted

## 2011-11-16 MED ORDER — OXYCODONE-ACETAMINOPHEN 5-325 MG PO TABS: 1.0000 | ORAL_TABLET | ORAL | Status: DC | PRN

## 2011-11-16 NOTE — Telephone Encounter (Signed)
Called pt after consult w/[facility]. Pt informed that Rx refill is ready for pick up. She will try to get a ride to clinic and pick it up today.

## 2011-11-16 NOTE — Telephone Encounter (Signed)
Pt left message requesting refill of pain med. She has surgery scheduled on 12/03/11

## 2011-11-17 ENCOUNTER — Encounter (HOSPITAL_COMMUNITY): Payer: Self-pay

## 2011-11-18 ENCOUNTER — Ambulatory Visit: Payer: Self-pay | Admitting: Obstetrics and Gynecology

## 2011-11-23 ENCOUNTER — Other Ambulatory Visit: Payer: Self-pay | Admitting: Family Medicine

## 2011-11-23 ENCOUNTER — Telehealth: Payer: Self-pay | Admitting: Obstetrics and Gynecology

## 2011-11-23 MED ORDER — OXYCODONE-ACETAMINOPHEN 5-325 MG PO TABS: 1.0000 | ORAL_TABLET | ORAL | Status: DC | PRN

## 2011-11-23 NOTE — Progress Notes (Signed)
Patient called requesting refill percocet.  Has appt for marsupialization on 4/18.  Will refill.  #30 given.

## 2011-11-23 NOTE — Progress Notes (Signed)
Addended by: Levie Heritage on: 11/23/2011 11:36 AM   Modules accepted: Orders

## 2011-11-23 NOTE — Telephone Encounter (Signed)
Patient called requesting RF of percocet until her marsupial surgery on 12/03/11. Approved by Dr. Adrian Blackwater. Rx at front desk for pick up. Pt. Notified and satisfied.

## 2011-11-26 NOTE — Telephone Encounter (Signed)
Patient called requesting RF of percocet until her marsupial surgery on 12/03/11. Approved by Dr. Stinson. Rx at front desk for pick up. Pt. Notified and satisfied.  

## 2011-12-03 ENCOUNTER — Ambulatory Visit (HOSPITAL_COMMUNITY)
Admission: RE | Admit: 2011-12-03 | Discharge: 2011-12-03 | Disposition: A | Discharge status: Home / Self Care | Payer: Self-pay | Source: Ambulatory Visit | Attending: Obstetrics and Gynecology | Admitting: Obstetrics and Gynecology

## 2011-12-03 ENCOUNTER — Encounter (HOSPITAL_COMMUNITY): Payer: Self-pay | Admitting: *Deleted

## 2011-12-03 ENCOUNTER — Encounter (HOSPITAL_COMMUNITY): Payer: Self-pay | Admitting: Anesthesiology

## 2011-12-03 ENCOUNTER — Ambulatory Visit (HOSPITAL_COMMUNITY): Payer: Self-pay | Admitting: Anesthesiology

## 2011-12-03 ENCOUNTER — Encounter (HOSPITAL_COMMUNITY)
Admission: RE | Disposition: A | Discharge status: Home / Self Care | Payer: Self-pay | Source: Ambulatory Visit | Attending: Obstetrics and Gynecology

## 2011-12-03 DIAGNOSIS — N751 Abscess of Bartholin's gland: Secondary | ICD-10-CM | POA: Insufficient documentation

## 2011-12-03 DIAGNOSIS — N750 Cyst of Bartholin's gland: Secondary | ICD-10-CM

## 2011-12-03 HISTORY — DX: Major depressive disorder, single episode, unspecified: F32.9

## 2011-12-03 HISTORY — PX: BARTHOLIN CYST MARSUPIALIZATION: SHX5383

## 2011-12-03 HISTORY — DX: Depression, unspecified: F32.A

## 2011-12-03 HISTORY — DX: Anxiety disorder, unspecified: F41.9

## 2011-12-03 LAB — PREGNANCY, URINE: Preg Test, Ur: NEGATIVE

## 2011-12-03 LAB — CBC
HCT: 41.4 % (ref 36.0–46.0)
Hemoglobin: 13.8 g/dL (ref 12.0–15.0)
MCH: 31.2 pg (ref 26.0–34.0)
MCHC: 33.3 g/dL (ref 30.0–36.0)
MCV: 93.5 fL (ref 78.0–100.0)
Platelets: 307 10*3/uL (ref 150–400)
RBC: 4.43 MIL/uL (ref 3.87–5.11)
RDW: 14.7 % (ref 11.5–15.5)
WBC: 16.5 10*3/uL — ABNORMAL HIGH (ref 4.0–10.5)

## 2011-12-03 SURGERY — MARSUPIALIZATION, CYST, BARTHOLIN'S GLAND
Anesthesia: General | Site: Vagina | Laterality: Left | Wound class: Dirty or Infected

## 2011-12-03 MED ORDER — BUPIVACAINE HCL (PF) 0.25 % IJ SOLN
INTRAMUSCULAR | Status: DC | PRN
  Administered 2011-12-03: 4 mL

## 2011-12-03 MED ORDER — MIDAZOLAM HCL 5 MG/5ML IJ SOLN
INTRAMUSCULAR | Status: DC | PRN
  Administered 2011-12-03: 2 mg via INTRAVENOUS

## 2011-12-03 MED ORDER — LIDOCAINE HCL (CARDIAC) 20 MG/ML IV SOLN
INTRAVENOUS | Status: AC
  Filled 2011-12-03: qty 5

## 2011-12-03 MED ORDER — PROPOFOL 10 MG/ML IV EMUL
INTRAVENOUS | Status: DC | PRN
  Administered 2011-12-03: 200 mg via INTRAVENOUS

## 2011-12-03 MED ORDER — ONDANSETRON HCL 4 MG/2ML IJ SOLN
INTRAMUSCULAR | Status: DC | PRN
  Administered 2011-12-03: 4 mg via INTRAVENOUS

## 2011-12-03 MED ORDER — DEXAMETHASONE SODIUM PHOSPHATE 10 MG/ML IJ SOLN
INTRAMUSCULAR | Status: AC
  Filled 2011-12-03: qty 1

## 2011-12-03 MED ORDER — KETOROLAC TROMETHAMINE 30 MG/ML IJ SOLN
INTRAMUSCULAR | Status: AC
  Filled 2011-12-03: qty 1

## 2011-12-03 MED ORDER — ONDANSETRON HCL 4 MG/2ML IJ SOLN
INTRAMUSCULAR | Status: AC
  Filled 2011-12-03: qty 2

## 2011-12-03 MED ORDER — OXYCODONE-ACETAMINOPHEN 5-325 MG PO TABS
1.0000 | ORAL_TABLET | ORAL | Status: DC | PRN
  Administered 2011-12-03: 1 via ORAL

## 2011-12-03 MED ORDER — PROMETHAZINE HCL 25 MG/ML IJ SOLN: 6.2500 mg | INTRAMUSCULAR | Status: DC | PRN

## 2011-12-03 MED ORDER — OXYCODONE-ACETAMINOPHEN 5-325 MG PO TABS
ORAL_TABLET | ORAL | Status: AC
  Filled 2011-12-03: qty 1

## 2011-12-03 MED ORDER — MEPERIDINE HCL 25 MG/ML IJ SOLN: 6.2500 mg | INTRAMUSCULAR | Status: DC | PRN

## 2011-12-03 MED ORDER — DEXAMETHASONE SODIUM PHOSPHATE 10 MG/ML IJ SOLN
INTRAMUSCULAR | Status: DC | PRN
  Administered 2011-12-03: 10 mg via INTRAVENOUS

## 2011-12-03 MED ORDER — IBUPROFEN 800 MG PO TABS: 800.0000 mg | ORAL_TABLET | Freq: Three times a day (TID) | ORAL | Status: AC | PRN

## 2011-12-03 MED ORDER — FENTANYL CITRATE 0.05 MG/ML IJ SOLN: 25.0000 ug | INTRAMUSCULAR | Status: DC | PRN

## 2011-12-03 MED ORDER — OXYCODONE-ACETAMINOPHEN 5-325 MG PO TABS: 1.0000 | ORAL_TABLET | ORAL | Status: AC | PRN

## 2011-12-03 MED ORDER — KETOROLAC TROMETHAMINE 30 MG/ML IJ SOLN
INTRAMUSCULAR | Status: DC | PRN
  Administered 2011-12-03: 30 mg via INTRAVENOUS

## 2011-12-03 MED ORDER — KETOROLAC TROMETHAMINE 30 MG/ML IJ SOLN: 15.0000 mg | Freq: Once | INTRAMUSCULAR | Status: DC | PRN

## 2011-12-03 MED ORDER — BUPIVACAINE HCL (PF) 0.25 % IJ SOLN
INTRAMUSCULAR | Status: AC
  Filled 2011-12-03: qty 30

## 2011-12-03 MED ORDER — MIDAZOLAM HCL 2 MG/2ML IJ SOLN
INTRAMUSCULAR | Status: AC
  Filled 2011-12-03: qty 2

## 2011-12-03 MED ORDER — LACTATED RINGERS IV SOLN
INTRAVENOUS | Status: DC
  Administered 2011-12-03 (×2): via INTRAVENOUS

## 2011-12-03 MED ORDER — FENTANYL CITRATE 0.05 MG/ML IJ SOLN
INTRAMUSCULAR | Status: DC | PRN
  Administered 2011-12-03: 50 ug via INTRAVENOUS
  Administered 2011-12-03: 100 ug via INTRAVENOUS
  Administered 2011-12-03: 50 ug via INTRAVENOUS

## 2011-12-03 MED ORDER — FENTANYL CITRATE 0.05 MG/ML IJ SOLN
INTRAMUSCULAR | Status: AC
  Filled 2011-12-03: qty 2

## 2011-12-03 MED ORDER — LIDOCAINE HCL (CARDIAC) 20 MG/ML IV SOLN
INTRAVENOUS | Status: DC | PRN
  Administered 2011-12-03: 60 mg via INTRAVENOUS

## 2011-12-03 MED ORDER — PROPOFOL 10 MG/ML IV EMUL
INTRAVENOUS | Status: AC
  Filled 2011-12-03: qty 20

## 2011-12-03 MED ORDER — DOXYCYCLINE HYCLATE 50 MG PO CAPS: 50.0000 mg | ORAL_CAPSULE | Freq: Two times a day (BID) | ORAL | Status: AC

## 2011-12-03 SURGICAL SUPPLY — 33 items
BLADE SURG 15 STRL LF C SS BP (BLADE) ×1 IMPLANT
BLADE SURG 15 STRL SS (BLADE) ×2
CLOTH BEACON ORANGE TIMEOUT ST (SAFETY) ×2 IMPLANT
CONTAINER PREFILL 10% NBF 15ML (MISCELLANEOUS) IMPLANT
COUNTER NEEDLE 1200 MAGNETIC (NEEDLE) ×1 IMPLANT
DRAIN PENROSE 1/4X12 LTX (DRAIN) IMPLANT
ELECT REM PT RETURN 9FT ADLT (ELECTROSURGICAL) ×2
ELECTRODE REM PT RTRN 9FT ADLT (ELECTROSURGICAL) IMPLANT
GAUZE PACKING 1/4 X5 YD (GAUZE/BANDAGES/DRESSINGS) IMPLANT
GAUZE PACKING IODOFORM 1/2 (PACKING) IMPLANT
GLOVE BIOGEL PI IND STRL 6.5 (GLOVE) IMPLANT
GLOVE BIOGEL PI INDICATOR 6.5 (GLOVE) ×1
GLOVE INDICATOR 6.5 STRL GRN (GLOVE) ×4 IMPLANT
GLOVE SURG SS PI 6.0 STRL IVOR (GLOVE) ×2 IMPLANT
GLOVE SURG SS PI 7.5 STRL IVOR (GLOVE) ×1 IMPLANT
GOWN PREVENTION PLUS LG XLONG (DISPOSABLE) ×2 IMPLANT
GOWN STRL REIN XL XLG (GOWN DISPOSABLE) ×2 IMPLANT
NDL HYPO 25X1 1.5 SAFETY (NEEDLE) ×1 IMPLANT
NEEDLE HYPO 25X1 1.5 SAFETY (NEEDLE) ×2 IMPLANT
NS IRRIG 1000ML POUR BTL (IV SOLUTION) ×2 IMPLANT
PACK VAGINAL MINOR WOMEN LF (CUSTOM PROCEDURE TRAY) ×2 IMPLANT
PAD PREP 24X48 CUFFED NSTRL (MISCELLANEOUS) ×2 IMPLANT
PENCIL BUTTON HOLSTER BLD 10FT (ELECTRODE) IMPLANT
SUT VIC AB 3-0 PS2 18 (SUTURE) ×4
SUT VIC AB 3-0 PS2 18XBRD (SUTURE) IMPLANT
SUT VICRYL 0 UR6 27IN ABS (SUTURE) IMPLANT
SWAB CULTURE LIQ STUART DBL (MISCELLANEOUS) ×1 IMPLANT
SYR CONTROL 10ML LL (SYRINGE) ×1 IMPLANT
TOWEL OR 17X24 6PK STRL BLUE (TOWEL DISPOSABLE) ×4 IMPLANT
TUBE ANAEROBIC SPECIMEN COL (MISCELLANEOUS) ×1 IMPLANT
TUBING NON-CON 1/4 X 20 CONN (TUBING) ×1 IMPLANT
WATER STERILE IRR 1000ML POUR (IV SOLUTION) ×2 IMPLANT
YANKAUER SUCT BULB TIP NO VENT (SUCTIONS) ×1 IMPLANT

## 2011-12-03 NOTE — Op Note (Signed)
ROSEMAE MCQUOWN 12/03/2011  PREOPERATIVE DIAGNOSIS:  Recurrent Bartholin abscess. POSTOPERATIVE DIAGNOSIS: The same PROCEDURE: Marsupialization of bartholin abscess SURGEON:  Dr. Catalina Antigua   INDICATIONS: 41 y.o. yo [id number] with recurrent bartholin abscess here for Marsupialization  .Risks of surgery were discussed with the patient including but not limited to: bleeding which may require transfusion; infection which may require antibiotics; injury to surrounding organs; need for additional procedures including laparotomy or laparoscopy; and other postoperative/anesthesia complications. Written informed consent was obtained.    FINDINGS:  A 4 cm left Bartholin abscess   ANESTHESIA:   General. INTRAVENOUS FLUIDS:  1000 ml of LR ESTIMATED BLOOD LOSS:  Less than 20 ml. SPECIMENS: Cultures of abscess COMPLICATIONS:  None immediate.  PROCEDURE DETAILS:  The patient was taken to the operating room where general anesthesia was administered and was found to be adequate.  After an adequate timeout was performed, she was placed in the dorsal lithotomy position and examined; then prepped and draped in the sterile manner.   Her bladder was catheterized for an unmeasured amount of clear, yellow urine. The labia are retracted with Alice clamp, and the introitus of the vagina is exposed. An incision is made over the mucosa of the vagina at its junction with the introitus down to the wall of the gland. The wall of the gland is incised. The contents of the abscess are evacuated. A culture is taken of the abscess. The walls of the abscess are grasped with Allis clamps. The wall of the abscess is sutured with interrupted 3-0 synthetic absorbable suture to the skin of the introitus laterally and to the vaginal mucosa medially. The patient tolerated the procedure well. All instrument, sponge, lap and needle count were correct x 2. Patient transferred to recovery room in stable condition.

## 2011-12-03 NOTE — Anesthesia Preprocedure Evaluation (Signed)
Anesthesia Evaluation  Patient identified by MRN, date of birth, ID band Patient awake    Reviewed: Allergy & Precautions, H&P , NPO status , Patient's Chart, lab work & pertinent test results  Airway Mallampati: I TM Distance: >3 FB Neck ROM: full    Dental  (+) Teeth Intact and Poor Dentition   Pulmonary Current Smoker,  breath sounds clear to auscultation  Pulmonary exam normal       Cardiovascular negative cardio ROS      Neuro/Psych PSYCHIATRIC DISORDERS Anxiety Depression negative neurological ROS     GI/Hepatic negative GI ROS, Neg liver ROS,   Endo/Other  negative endocrine ROS  Renal/GU negative Renal ROS  negative genitourinary   Musculoskeletal negative musculoskeletal ROS (+)   Abdominal Normal abdominal exam  (+)   Peds negative pediatric ROS (+)  Hematology negative hematology ROS (+)   Anesthesia Other Findings   Reproductive/Obstetrics negative OB ROS                           Anesthesia Physical Anesthesia Plan  ASA: II  Anesthesia Plan: General   Post-op Pain Management:    Induction: Intravenous  Airway Management Planned: LMA  Additional Equipment:   Intra-op Plan:   Post-operative Plan:   Informed Consent: I have reviewed the patients History and Physical, chart, labs and discussed the procedure including the risks, benefits and alternatives for the proposed anesthesia with the patient or authorized representative who has indicated his/her understanding and acceptance.     Plan Discussed with: CRNA and Surgeon  Anesthesia Plan Comments:         Anesthesia Quick Evaluation

## 2011-12-03 NOTE — Discharge Instructions (Signed)
Continue sitz bath starting tomorrow and applying heat to the are starting tomorrow. Complete antibiotics. Take pain medications as needed. Follow-up in [facility] hospital clinic.

## 2011-12-03 NOTE — H&P (Signed)
Jill Mcfarland is an 41 y.o. female [phone] with recurrent bartholin abscess s/p multiple I&D presenting today for scheduled marsupialization. Patient reports since last seen in the office in March, the abscess started to drain a little but is still present and tender to touch  Pertinent Gynecological History: Menses: flow is moderate Bleeding: monthly Contraception: tubal ligation DES exposure: denies Blood transfusions: none Sexually transmitted diseases:  Previous GYN Procedures: DNC  Last mammogram: n/a     Menstrual History:  Patient's last menstrual period was 10/28/2011.    Past Medical History  Diagnosis Date  . Abscess   . Bartholin cyst   . Depression     History - no current prob per pt.  . Anxiety     History - no current prob per pt.    Past Surgical History  Procedure Date  . Tubal ligation   . Leep   . Incison and drainage     Bartholin cyst  . Wisdom tooth extraction   . Svd     x 3    Family History  Problem Relation Age of Onset  . Cancer Mother   . Asthma Son     Social History:  reports that she has been smoking Cigarettes.  She has a 12 pack-year smoking history. She has never used smokeless tobacco. She reports that she does not drink alcohol or use illicit drugs.  Allergies: No Known Allergies  Prescriptions prior to admission  Medication Sig Dispense Refill  . ibuprofen (ADVIL,MOTRIN) 200 MG tablet Take 200 mg by mouth 2 (two) times daily as needed. pain      . oxyCODONE-acetaminophen (PERCOCET) 5-325 MG per tablet Take 1 tablet by mouth every 4 (four) hours as needed for pain.  30 tablet  0    Review of Systems  All other systems reviewed and are negative.    Blood pressure 120/79, pulse 100, temperature 98.4 F (36.9 C), temperature source Oral, resp. rate 18, height 5\' 2"  (1.575 m), weight 65.772 kg (145 lb), last menstrual period 10/28/2011, SpO2 97.00%. Physical Exam GENERAL: Well-developed, well-nourished female in no acute  distress.  HEENT: Normocephalic, atraumatic. Sclerae anicteric.  NECK: Supple. Normal thyroid.  LUNGS: Clear to auscultation bilaterally.  HEART: Regular rate and rhythm. ABDOMEN: Soft, nontender, nondistended. No organomegaly. PELVIC: 4 cm left enlarged Bartholin gland EXTREMITIES: No cyanosis, clubbing, or edema, 2+ distal pulses.  Results for orders placed during the hospital encounter of 12/03/11 (from the past 24 hour(s))  PREGNANCY, URINE     Status: Normal   Collection Time   12/03/11  2:00 PM      Component Value Range   Preg Test, Ur NEGATIVE  NEGATIVE   CBC     Status: Abnormal   Collection Time   12/03/11  2:09 PM      Component Value Range   WBC 16.5 (*) 4.0 - 10.5 (K/uL)   RBC 4.43  3.87 - 5.11 (MIL/uL)   Hemoglobin 13.8  12.0 - 15.0 (g/dL)   HCT 29.5  62.1 - 30.8 (%)   MCV 93.5  78.0 - 100.0 (fL)   MCH 31.2  26.0 - 34.0 (pg)   MCHC 33.3  30.0 - 36.0 (g/dL)   RDW 65.7  84.6 - 96.2 (%)   Platelets 307  150 - 400 (K/uL)    No results found.  Assessment/Plan: 41 yo with recurrent bartholin abscess here for marsupialization - Risks, benefits and alternatives explained including but not limited to risk of bleeding, infection  and damage to adjacent organs. Patient verbalized understanding and all questions were answered.  Teesha Ohm 12/03/2011, 2:53 PM

## 2011-12-03 NOTE — Transfer of Care (Signed)
Immediate Anesthesia Transfer of Care Note  Patient: Jill Mcfarland  Procedure(s) Performed: Procedure(s) (LRB): BARTHOLIN CYST MARSUPIALIZATION (Left)  Patient Location: PACU  Anesthesia Type: General  Level of Consciousness: sedated  Airway & Oxygen Therapy: Patient Spontanous Breathing and Patient connected to nasal cannula oxygen  Post-op Assessment: Report given to PACU RN and Post -op Vital signs reviewed and stable  Post vital signs: stable  Complications: No apparent anesthesia complications

## 2011-12-03 NOTE — Anesthesia Postprocedure Evaluation (Signed)
Anesthesia Post Note  Patient: Jill Mcfarland  Procedure(s) Performed: Procedure(s) (LRB): BARTHOLIN CYST MARSUPIALIZATION (Left)  Anesthesia type: General  Patient location: PACU  Post pain: Pain level controlled  Post assessment: Post-op Vital signs reviewed  Last Vitals:  Filed Vitals:   12/03/11 1615  BP:   Pulse:   Temp:   Resp: 18    Post vital signs: Reviewed  Level of consciousness: sedated  Complications: No apparent anesthesia complicationsfj

## 2011-12-04 ENCOUNTER — Telehealth: Payer: Self-pay | Admitting: *Deleted

## 2011-12-04 DIAGNOSIS — B379 Candidiasis, unspecified: Secondary | ICD-10-CM

## 2011-12-04 NOTE — Telephone Encounter (Addendum)
Patient called and left a message she had surgery yesterday and wants diflucan called in - states they put her on antibiotics and that always get a yeast infection and wants to pick it up at same time as other antibiotics. Per chart review pt. Had bartholin cyst marsupialization yesterday and was prescribed doxylcycline. Called Dr. Jolayne Panther and received order patient may have diflucan 150mg  po once. Called patient and left a message we are returning your call .and prescription has been called in. Call us back if you have any questions.

## 2011-12-06 LAB — WOUND CULTURE: Culture: NO GROWTH

## 2011-12-07 ENCOUNTER — Encounter (HOSPITAL_COMMUNITY): Payer: Self-pay | Admitting: Obstetrics and Gynecology

## 2011-12-07 MED ORDER — FLUCONAZOLE 150 MG PO TABS: 150.0000 mg | ORAL_TABLET | Freq: Once | ORAL | Status: AC

## 2011-12-08 LAB — ANAEROBIC CULTURE

## 2011-12-15 ENCOUNTER — Telehealth: Payer: Self-pay | Admitting: *Deleted

## 2011-12-15 NOTE — Telephone Encounter (Signed)
Pt left message stating that she had a period just prior to her surgery on the 15th.  Now she is bleeding again and it is heavy- lasting 4 days and her normal periods only last 3 days. Please call back.  I returned her call and discussed her concerns. She stated that she is soaking through a sanitary pad every hour and this has been consistent for 4 days. I advised pt to go to [facility] for evaluation. Pt voiced understanding.

## 2011-12-16 ENCOUNTER — Telehealth: Payer: Self-pay | Admitting: *Deleted

## 2011-12-16 ENCOUNTER — Ambulatory Visit: Payer: Self-pay | Admitting: Physician Assistant

## 2011-12-16 ENCOUNTER — Telehealth: Payer: Self-pay

## 2011-12-16 NOTE — Telephone Encounter (Signed)
Discussed with supervisor - called patient and gave her 1:30 appointment for today. Patient agrees and will come in for visit.

## 2011-12-16 NOTE — Telephone Encounter (Signed)
Pt called and stated that she was unable to make it to the appt today @ 130pm.  Can she come in tomorrow?  I informed pt that she was able to get an appt today because there was an opening at that time.  And that there is no sure way of knowing if there will be an opening tomorrow.  I advised pt that she has a concern of her bleeding that she should go the ER.  Pt stated that she was and had no further questions.

## 2011-12-16 NOTE — Telephone Encounter (Signed)
Patient called and left a message on call line at 11:31 stating please call me back, states she spoke with a nurse who told her to go to emergency room , but states " I'm telling you I had the surgery to keep me from going back to the hospital.  If you think I can wait- please call me- I don't want to go to the hospital

## 2011-12-21 ENCOUNTER — Encounter: Payer: Self-pay | Admitting: Obstetrics and Gynecology

## 2011-12-21 ENCOUNTER — Ambulatory Visit (INDEPENDENT_AMBULATORY_CARE_PROVIDER_SITE_OTHER): Payer: Self-pay | Admitting: Obstetrics and Gynecology

## 2011-12-21 VITALS — BP 128/85 | HR 103 | Temp 97.8°F | Ht 62.0 in | Wt 148.9 lb

## 2011-12-21 DIAGNOSIS — Z09 Encounter for follow-up examination after completed treatment for conditions other than malignant neoplasm: Secondary | ICD-10-CM

## 2011-12-21 NOTE — Progress Notes (Signed)
  Subjective:    Patient ID: Jill Mcfarland, female    DOB: February 03, 1971, 41 y.o.   MRN: [id number]  HPI 41 yo [id number] s/p marsupialization of Bartholin abscess on 4/18 presenting today for post-op check. Patient reports feeling better since her surgery but was concern about a second period that she experienced the day following surgery. Patient states she normally has 3-day periods but this time had an 8-day heavy menses following the surgery. Patient is certain it was vaginal bleeding and not bleeding from the surgical site as she used tampons. This is the first time this has happened to her.   Review of Systems  All other systems reviewed and are negative.       Objective:   Physical Exam  GENERAL: Well-developed, well-nourished female in no acute distress.  ABDOMEN: Soft, nontender, nondistended. No organomegaly. PELVIC: Normal external female genitalia. Surgical site healing well. Small 0.5 cm opening. No drainage. Slightly tender to touch EXTREMITIES: No cyanosis, clubbing, or edema, 2+ distal pulses.     Assessment & Plan:  41 yo s/p marsupialization on 4/18 - Patient doing well - Continue sitz bath - Patient advised to keep a menstrual calendar and to return if abnormal bleeding persists

## 2011-12-30 ENCOUNTER — Emergency Department (HOSPITAL_COMMUNITY)
Admission: EM | Admit: 2011-12-30 | Discharge: 2011-12-30 | Disposition: A | Discharge status: Home / Self Care | Payer: Self-pay | Attending: Emergency Medicine | Admitting: Emergency Medicine

## 2011-12-30 ENCOUNTER — Encounter (HOSPITAL_COMMUNITY): Payer: Self-pay

## 2011-12-30 DIAGNOSIS — F17200 Nicotine dependence, unspecified, uncomplicated: Secondary | ICD-10-CM | POA: Insufficient documentation

## 2011-12-30 DIAGNOSIS — M62838 Other muscle spasm: Secondary | ICD-10-CM | POA: Insufficient documentation

## 2011-12-30 DIAGNOSIS — M549 Dorsalgia, unspecified: Secondary | ICD-10-CM | POA: Insufficient documentation

## 2011-12-30 DIAGNOSIS — M25519 Pain in unspecified shoulder: Secondary | ICD-10-CM | POA: Insufficient documentation

## 2011-12-30 DIAGNOSIS — M62830 Muscle spasm of back: Secondary | ICD-10-CM

## 2011-12-30 MED ORDER — DIAZEPAM 5 MG PO TABS
5.0000 mg | ORAL_TABLET | Freq: Once | ORAL | Status: AC
  Administered 2011-12-30: 5 mg via ORAL
  Filled 2011-12-30: qty 1

## 2011-12-30 MED ORDER — NAPROXEN 500 MG PO TABS: 500.0000 mg | ORAL_TABLET | Freq: Two times a day (BID) | ORAL | Status: DC

## 2011-12-30 MED ORDER — DIAZEPAM 5 MG PO TABS: 5.0000 mg | ORAL_TABLET | Freq: Three times a day (TID) | ORAL | Status: AC | PRN

## 2011-12-30 MED ORDER — KETOROLAC TROMETHAMINE 60 MG/2ML IM SOLN
60.0000 mg | Freq: Once | INTRAMUSCULAR | Status: AC
  Administered 2011-12-30: 60 mg via INTRAMUSCULAR
  Filled 2011-12-30: qty 2

## 2011-12-30 NOTE — Discharge Instructions (Signed)
Back Pain, Adult Low back pain is very common. About 1 in 5 people have back pain.The cause of low back pain is rarely dangerous. The pain often gets better over time.About half of people with a sudden onset of back pain feel better in just 2 weeks. About 8 in 10 people feel better by 6 weeks.  CAUSES Some common causes of back pain include:  Strain of the muscles or ligaments supporting the spine.   Wear and tear (degeneration) of the spinal discs.   Arthritis.   Direct injury to the back.  DIAGNOSIS Most of the time, the direct cause of low back pain is not known.However, back pain can be treated effectively even when the exact cause of the pain is unknown.Answering your caregiver's questions about your overall health and symptoms is one of the most accurate ways to make sure the cause of your pain is not dangerous. If your caregiver needs more information, he or she may order lab work or imaging tests (X-rays or MRIs).However, even if imaging tests show changes in your back, this usually does not require surgery. HOME CARE INSTRUCTIONS For many people, back pain returns.Since low back pain is rarely dangerous, it is often a condition that people can learn to manageon their own.   Remain active. It is stressful on the back to sit or stand in one place. Do not sit, drive, or stand in one place for more than 30 minutes at a time. Take short walks on level surfaces as soon as pain allows.Try to increase the length of time you walk each day.   Do not stay in bed.Resting more than 1 or 2 days can delay your recovery.   Do not avoid exercise or work.Your body is made to move.It is not dangerous to be active, even though your back may hurt.Your back will likely heal faster if you return to being active before your pain is gone.   Pay attention to your body when you bend and lift. Many people have less discomfortwhen lifting if they bend their knees, keep the load close to their  bodies,and avoid twisting. Often, the most comfortable positions are those that put less stress on your recovering back.   Find a comfortable position to sleep. Use a firm mattress and lie on your side with your knees slightly bent. If you lie on your back, put a pillow under your knees.   Only take over-the-counter or prescription medicines as directed by your caregiver. Over-the-counter medicines to reduce pain and inflammation are often the most helpful.Your caregiver may prescribe muscle relaxant drugs.These medicines help dull your pain so you can more quickly return to your normal activities and healthy exercise.   Put ice on the injured area.   Put ice in a plastic bag.   Place a towel between your skin and the bag.   Leave the ice on for 15 to 20 minutes, 3 to 4 times a day for the first 2 to 3 days. After that, ice and heat may be alternated to reduce pain and spasms.   Ask your caregiver about trying back exercises and gentle massage. This may be of some benefit.   Avoid feeling anxious or stressed.Stress increases muscle tension and can worsen back pain.It is important to recognize when you are anxious or stressed and learn ways to manage it.Exercise is a great option.  SEEK MEDICAL CARE IF:  You have pain that is not relieved with rest or medicine.   You have   pain that does not improve in 1 week.   You have new symptoms.   You are generally not feeling well.  SEEK IMMEDIATE MEDICAL CARE IF:   You have pain that radiates from your back into your legs.   You develop new bowel or bladder control problems.   You have unusual weakness or numbness in your arms or legs.   You develop nausea or vomiting.   You develop abdominal pain.   You feel faint.  Document Released: 08/03/2005 Document Revised: 07/23/2011 Document Reviewed: 12/22/2010 [employer] Patient Information 2012 [employer]. 

## 2011-12-30 NOTE — ED Notes (Signed)
Patient here with right shoulder pain that she reports is radiating into right side of neck, denies injury. Reports pain with any movement

## 2011-12-30 NOTE — ED Provider Notes (Signed)
History     CSN: [id number]  Arrival date & time 12/30/11  1226   First MD Initiated Contact with Patient 12/30/11 1233      Chief Complaint  Patient presents with  . Shoulder Pain    (Consider location/radiation/quality/duration/timing/severity/associated sxs/prior treatment) The history is provided by the patient.  41 y/o F with 3 weeks of gradually increasing right upper back and posterior shoulder pain with NKI. Pain has begun to radiate to the left upper back and right side of the neck in the last few days. Sharp and burning, severe, constant. Worse with movement, no alleviating factors. Has been taking naproxen for the pain without improvement. Denies fever, chills, HA, neck stiffness, weakness/numbness to the extremities, or rash.  Past Medical History  Diagnosis Date  . Abscess   . Bartholin cyst   . Depression     History - no current prob per pt.  . Anxiety     History - no current prob per pt.    Past Surgical History  Procedure Date  . Tubal ligation   . Leep   . Incison and drainage     Bartholin cyst  . Wisdom tooth extraction   . Svd     x 3  . Bartholin cyst marsupialization 12/03/2011    Procedure: BARTHOLIN CYST MARSUPIALIZATION;  Surgeon: Catalina Antigua, MD;  Location: [facility];  Service: Gynecology;  Laterality: Left;  Bartholin Abcess    Family History  Problem Relation Age of Onset  . Cancer Mother   . Asthma Son     History  Substance Use Topics  . Smoking status: Current Everyday Smoker -- 0.5 packs/day for 24 years    Types: Cigarettes  . Smokeless tobacco: Never Used  . Alcohol Use: No    OB History    Grav Para Term Preterm Abortions TAB SAB Ect Mult Living   4 3 3  1 1    2       Review of Systems 10 systems reviewed and are negative for acute change except as noted in the HPI.  Allergies  Review of patient's allergies indicates no known allergies.  Home Medications   Current Outpatient Rx  Name Route Sig Dispense Refill    . NAPROXEN SODIUM 220 MG PO TABS Oral Take 440 mg by mouth every 12 (twelve) hours as needed. Pain      BP 121/79  Pulse 103  Temp(Src) 98.8 F (37.1 C) (Oral)  Resp 18  SpO2 96%  LMP 12/13/2011  Physical Exam  Nursing note and vitals reviewed. Constitutional: She is oriented to person, place, and time. She appears well-developed and well-nourished.       Uncomfortable appearing.   HENT:  Head: Normocephalic and atraumatic.  Right Ear: External ear normal.  Left Ear: External ear normal.  Mouth/Throat: Oropharynx is clear and moist.  Eyes: Pupils are equal, round, and reactive to light.  Neck: Neck supple. No spinous process tenderness and no muscular tenderness present.       Full active ROM with pain  Cardiovascular: Regular rhythm.  Tachycardia present.        Bilateral radial pulses 2+  Pulmonary/Chest: Effort normal and breath sounds normal. No respiratory distress. She has no wheezes. She has no rales. She exhibits no tenderness.  Musculoskeletal: She exhibits tenderness. She exhibits no edema.       Cervical back: She exhibits no tenderness and no bony tenderness.       Thoracic back: She exhibits tenderness and  spasm. She exhibits no bony tenderness.       Lumbar back: She exhibits no tenderness and no bony tenderness.       Back:  Neurological: She is alert and oriented to person, place, and time. She has normal strength. GCS eye subscore is 4. GCS verbal subscore is 5. GCS motor subscore is 6.       Sensation intact to light touch to BUE. 5/5 and symmetric strength in BUE.   Skin: Skin is warm and dry. No rash noted. No erythema.  Psychiatric:       Anxious-appearing    ED Course  Procedures (including critical care time)  Labs Reviewed - No data to display No results found.   1. Muscle spasm of back       MDM  Upper back pain x 3 weeks with NKI. Spasm on exam with pain over right rhomboid, scapula. No gross neuro or motor deficit on exam. No midline  pain to suggest imaging warranted. Advised NSAID and muscle relaxant use, with discussion of warning signs that should prompt re-eval.       Shaaron Adler, PA-C 12/30/11 1352

## 2011-12-30 NOTE — ED Provider Notes (Signed)
Medical screening examination/treatment/procedure(s) were performed by non-physician practitioner and as supervising physician I was immediately available for consultation/collaboration. Devoria Albe, MD, Armando Gang   Ward Givens, MD 12/30/11 [id number]

## 2012-01-04 ENCOUNTER — Emergency Department: Payer: Self-pay | Admitting: Emergency Medicine

## 2012-01-07 ENCOUNTER — Emergency Department (HOSPITAL_COMMUNITY)
Admission: EM | Admit: 2012-01-07 | Discharge: 2012-01-07 | Disposition: A | Discharge status: Home / Self Care | Payer: Self-pay | Attending: Emergency Medicine | Admitting: Emergency Medicine

## 2012-01-07 ENCOUNTER — Encounter (HOSPITAL_COMMUNITY): Payer: Self-pay | Admitting: Emergency Medicine

## 2012-01-07 DIAGNOSIS — M62838 Other muscle spasm: Secondary | ICD-10-CM | POA: Insufficient documentation

## 2012-01-07 DIAGNOSIS — M25519 Pain in unspecified shoulder: Secondary | ICD-10-CM | POA: Insufficient documentation

## 2012-01-07 DIAGNOSIS — M542 Cervicalgia: Secondary | ICD-10-CM | POA: Insufficient documentation

## 2012-01-07 MED ORDER — IBUPROFEN 800 MG PO TABS
800.0000 mg | ORAL_TABLET | Freq: Once | ORAL | Status: AC
  Administered 2012-01-07: 800 mg via ORAL
  Filled 2012-01-07: qty 1

## 2012-01-07 MED ORDER — ACETAMINOPHEN-CODEINE #3 300-30 MG PO TABS
1.0000 | ORAL_TABLET | Freq: Once | ORAL | Status: AC
  Administered 2012-01-07: 1 via ORAL
  Filled 2012-01-07: qty 1

## 2012-01-07 MED ORDER — DIAZEPAM 5 MG PO TABS: ORAL_TABLET | ORAL | Status: AC

## 2012-01-07 MED ORDER — ACETAMINOPHEN-CODEINE #3 300-30 MG PO TABS: 1.0000 | ORAL_TABLET | Freq: Four times a day (QID) | ORAL | Status: AC | PRN

## 2012-01-07 MED ORDER — DIAZEPAM 5 MG PO TABS
5.0000 mg | ORAL_TABLET | Freq: Once | ORAL | Status: AC
  Administered 2012-01-07: 5 mg via ORAL
  Filled 2012-01-07: qty 1

## 2012-01-07 MED ORDER — IBUPROFEN 800 MG PO TABS: 800.0000 mg | ORAL_TABLET | Freq: Three times a day (TID) | ORAL | Status: AC

## 2012-01-07 NOTE — Discharge Instructions (Signed)
Take ibuprofen as directed for inflammation and pain with tylenol#3 for breakthrough pain and valium for muscle relaxation but do not drive or operate machinery with tylenol#3 or valium use. Ice to areas of soreness for the next few days and then may move to heat. Expect to be sore for the next few day and follow up with primary care physician for recheck of ongoing symptoms but return to ER for emergent changing or worsening of symptoms. Establishment with a Primary Care provider is Very important for general health care concerns, minor illness and minor injury.  Shoulder Pain The shoulder is a ball and socket joint. The muscles and tendons (rotator cuff) are what keep the shoulder in its joint and stable. This collection of muscles and tendons holds in the head (ball) of the humerus (upper arm bone) in the fossa (cup) of the scapula (shoulder blade). Today no reason was found for your shoulder pain. Often pain in the shoulder may be treated conservatively with temporary immobilization. For example, holding the shoulder in one place using a sling for rest. Physical therapy may be needed if problems continue. HOME CARE INSTRUCTIONS   Apply ice to the sore area for 15 to 20 minutes, 3 to 4 times per day for the first 2 days. Put the ice in a plastic bag. Place a towel between the bag of ice and your skin.   If you have or were given a shoulder sling and straps, do not remove for as long as directed by your caregiver or until you see a caregiver for a follow-up examination. If you need to remove it to shower or bathe, move your arm as little as possible.   Sleep on several pillows at night to lessen swelling and pain.   Only take over-the-counter or prescription medicines for pain, discomfort, or fever as directed by your caregiver.   Keep any follow-up appointments in order to avoid any type of permanent shoulder disability or chronic pain problems.  SEEK MEDICAL CARE IF:   Pain in your shoulder  increases or new pain develops in your arm, hand, or fingers.   Your hand or fingers are colder than your other hand.   You do not obtain pain relief with the medications or your pain becomes worse.  SEEK IMMEDIATE MEDICAL CARE IF:   Your arm, hand, or fingers are numb or tingling.   Your arm, hand, or fingers are swollen, painful, or turn white or blue.   You develop chest pain or shortness of breath.  MAKE SURE YOU:   Understand these instructions.   Will watch your condition.   Will get help right away if you are not doing well or get worse.  Document Released: 05/13/2005 Document Revised: 07/23/2011 Document Reviewed: 07/18/2011 [facility] Patient Information 2012 [place], [place].

## 2012-01-07 NOTE — ED Notes (Signed)
Pt c/o of neck pain that radiates down her right arm to her hand at 10/10 for 1 month. Pt denies injury or fall.

## 2012-01-07 NOTE — ED Notes (Signed)
Was seen here for pain in neck and left arm about 1 week ago. Given shot, pain med. Pain has not resolved.

## 2012-01-07 NOTE — ED Provider Notes (Signed)
History     CSN: [id number]  Arrival date & time 01/07/12  1205   First MD Initiated Contact with Patient 01/07/12 1220      Chief Complaint  Patient presents with  . Neck Pain    x 1 month  . Arm Pain    tingling to fingertips    (Consider location/radiation/quality/duration/timing/severity/associated sxs/prior treatment) The history is provided by the patient and medical records.    Patient presents to ER complaining of a 2 week hx of right lower lateral neck/posterior shoulder pain that is aggravated by movement and described as "an aching tightness" with radiation of pain into right arm and tingling. She denies numbness or weakness of shoulder or arm. Patient states she was seen in ER last week for same pain and prescribed meds but states "I don't like naprosyn and that toradol shot they gave me wasn't worth a crap. I want a pain medicine." Patient states she completed the valium for muscle relaxation. Patient has taken nothing else for pain PTA. Denies fevers, chills, CP, SOB, extremity swelling or skin changes, joint swelling or heat. She denies known injury to shoulder or neck.   Past Medical History  Diagnosis Date  . Abscess   . Bartholin cyst   . Depression     History - no current prob per pt.  . Anxiety     History - no current prob per pt.    Past Surgical History  Procedure Date  . Tubal ligation   . Leep   . Incison and drainage     Bartholin cyst  . Wisdom tooth extraction   . Svd     x 3  . Bartholin cyst marsupialization 12/03/2011    Procedure: BARTHOLIN CYST MARSUPIALIZATION;  Surgeon: Catalina Antigua, MD;  Location: [facility];  Service: Gynecology;  Laterality: Left;  Bartholin Abcess    Family History  Problem Relation Age of Onset  . Cancer Mother   . Asthma Son     History  Substance Use Topics  . Smoking status: Current Everyday Smoker -- 0.5 packs/day for 24 years    Types: Cigarettes  . Smokeless tobacco: Never Used  . Alcohol Use: No     OB History    Grav Para Term Preterm Abortions TAB SAB Ect Mult Living   4 3 3  1 1    2       Review of Systems  Allergies  Review of patient's allergies indicates no known allergies.  Home Medications   Current Outpatient Rx  Name Route Sig Dispense Refill  . ACETAMINOPHEN-CODEINE #3 300-30 MG PO TABS Oral Take 1-2 tablets by mouth every 6 (six) hours as needed for pain. 15 tablet 0  . DIAZEPAM 5 MG PO TABS Oral Take 1 tablet (5 mg total) by mouth every 8 (eight) hours as needed (for muscle spasm). 12 tablet 0  . DIAZEPAM 5 MG PO TABS  Take 1 tablet by mouth every 4-6 hours as needed for muscle relaxation 15 tablet 0  . IBUPROFEN 800 MG PO TABS Oral Take 1 tablet (800 mg total) by mouth 3 (three) times daily. Take 800mg  by mouth at breakfast, lunch and dinner for the next 5 days 21 tablet 0  . NAPROXEN 500 MG PO TABS Oral Take 1 tablet (500 mg total) by mouth 2 (two) times daily with a meal. 20 tablet 0    BP 116/73  Pulse 95  Temp(Src) 98.7 F (37.1 C) (Oral)  Resp 18  SpO2  98%  LMP 12/30/2011  Physical Exam  Constitutional: She is oriented to person, place, and time. She appears well-developed and well-nourished. No distress.  HENT:  Head: Normocephalic and atraumatic.  Eyes: Conjunctivae are normal.  Neck: Normal range of motion. Neck supple. No tracheal deviation present.       Soft tissue TTP of right lateral neck into trapezius muscle and posterior shoulder but no skin changes or crepitous  Cardiovascular: Normal rate, regular rhythm, S1 normal, S2 normal, normal heart sounds and intact distal pulses.   Pulmonary/Chest: Effort normal and breath sounds normal. No respiratory distress. She has no wheezes. She has no rales. She exhibits no tenderness and no crepitus.  Abdominal: Normal appearance.  Musculoskeletal: Normal range of motion. She exhibits tenderness. She exhibits no edema.       Right shoulder: She exhibits normal range of motion, no tenderness, no  swelling, no effusion and no deformity.       5/5 strength of bilateral UE with normal reflexes and sensation. No midline spinal TTP.   Neurological: She is alert and oriented to person, place, and time. She has normal reflexes. No cranial nerve deficit.  Skin: Skin is warm and dry. She is not diaphoretic.  Psychiatric: She has a normal mood and affect.    ED Course  Procedures (including critical care time)  PO valium and tylenol #3, PO ibuprofen. PO valium  Labs Reviewed - No data to display No results found.   1. Muscle spasm   2. Shoulder pain       MDM  TTP of soft tissue of right posterior shoulder/trapezious muscle and lower lateral neck but RUE is neurovasc intact with 5/5 strength no normal reflexes. Patient has been seen numerous times in our ER for different pain complaints and per drug data base has seen multiple different providers in the past for different pain complaints receiving narcotics. Question drug seeking behavior. I spoke at length with patient about the need for establishment with a Primary Care provider for general health care concerns, minor illness and minor injury and for minor pain complaints. She voices understanding and is agreeable to plan.          [place], [place] 01/07/12 1302

## 2012-01-09 NOTE — ED Provider Notes (Signed)
Medical screening examination/treatment/procedure(s) were performed by non-physician practitioner and as supervising physician I was immediately available for consultation/collaboration.   Javontay Vandam L Kori Goins, MD 01/09/12 0736 

## 2012-01-20 ENCOUNTER — Encounter (HOSPITAL_COMMUNITY): Payer: Self-pay | Admitting: Family Medicine

## 2012-01-20 ENCOUNTER — Emergency Department (HOSPITAL_COMMUNITY): Payer: Self-pay

## 2012-01-20 ENCOUNTER — Emergency Department (HOSPITAL_COMMUNITY)
Admission: EM | Admit: 2012-01-20 | Discharge: 2012-01-21 | Disposition: A | Discharge status: Home / Self Care | Payer: Self-pay | Attending: Emergency Medicine | Admitting: Emergency Medicine

## 2012-01-20 DIAGNOSIS — M546 Pain in thoracic spine: Secondary | ICD-10-CM | POA: Insufficient documentation

## 2012-01-20 DIAGNOSIS — M542 Cervicalgia: Secondary | ICD-10-CM | POA: Insufficient documentation

## 2012-01-20 DIAGNOSIS — M549 Dorsalgia, unspecified: Secondary | ICD-10-CM

## 2012-01-20 DIAGNOSIS — F17200 Nicotine dependence, unspecified, uncomplicated: Secondary | ICD-10-CM | POA: Insufficient documentation

## 2012-01-20 MED ORDER — HYDROCODONE-ACETAMINOPHEN 5-325 MG PO TABS: 1.0000 | ORAL_TABLET | Freq: Four times a day (QID) | ORAL | Status: AC | PRN

## 2012-01-20 MED ORDER — HYDROCODONE-ACETAMINOPHEN 5-325 MG PO TABS
2.0000 | ORAL_TABLET | Freq: Once | ORAL | Status: AC
  Administered 2012-01-21: 2 via ORAL
  Filled 2012-01-20: qty 2

## 2012-01-20 MED ORDER — DIAZEPAM 5 MG/ML IJ SOLN
5.0000 mg | Freq: Once | INTRAMUSCULAR | Status: AC
  Administered 2012-01-20: 10 mg via INTRAMUSCULAR
  Filled 2012-01-20: qty 2

## 2012-01-20 MED ORDER — DIAZEPAM 5 MG PO TABS: 5.0000 mg | ORAL_TABLET | Freq: Three times a day (TID) | ORAL | Status: AC | PRN

## 2012-01-20 NOTE — ED Provider Notes (Signed)
History     CSN: [id number]  Arrival date & time 01/20/12  2001   First MD Initiated Contact with Patient 01/20/12 2253      Chief Complaint  Patient presents with  . Shoulder Pain    (Consider location/radiation/quality/duration/timing/severity/associated sxs/prior treatment) HPI Comments: Patient reports she has had several weeks of right upper back pain, no involving her bilateral neck.  Pain is described as burning.  She states she occasionally has numbness in the fingertips of her right hand.  Denies any weakness of the upper or lower extremities, denies fevers, loss of control of bowel or bladder, saddle anesthesia.   Pt states she has been here several times for the same issue and was given a follow up appointment with a PCP who required a $145 up front payment.  Has been unable to follow up with anyone.    The history is provided by the patient.    Past Medical History  Diagnosis Date  . Abscess   . Bartholin cyst   . Depression     History - no current prob per pt.  . Anxiety     History - no current prob per pt.    Past Surgical History  Procedure Date  . Tubal ligation   . Leep   . Incison and drainage     Bartholin cyst  . Wisdom tooth extraction   . Svd     x 3  . Bartholin cyst marsupialization 12/03/2011    Procedure: BARTHOLIN CYST MARSUPIALIZATION;  Surgeon: Catalina Antigua, MD;  Location: [facility];  Service: Gynecology;  Laterality: Left;  Bartholin Abcess    Family History  Problem Relation Age of Onset  . Cancer Mother   . Asthma Son     History  Substance Use Topics  . Smoking status: Current Everyday Smoker -- 0.5 packs/day for 24 years    Types: Cigarettes  . Smokeless tobacco: Never Used  . Alcohol Use: No    OB History    Grav Para Term Preterm Abortions TAB SAB Ect Mult Living   4 3 3  1 1    2       Review of Systems  Constitutional: Negative for fever and chills.  HENT: Positive for neck pain and neck stiffness.   Respiratory:  Negative for shortness of breath.   Cardiovascular: Negative for chest pain.  Gastrointestinal: Negative for abdominal pain.  Musculoskeletal: Positive for back pain.  Neurological: Positive for numbness. Negative for weakness.    Allergies  Review of patient's allergies indicates no known allergies.  Home Medications  No current outpatient prescriptions on file.  BP 117/80  Pulse 103  Temp(Src) 99.7 F (37.6 C) (Oral)  Resp 22  SpO2 96%  LMP 01/15/2012  Physical Exam  Nursing note and vitals reviewed. Constitutional: She is oriented to person, place, and time. She appears well-developed and well-nourished. She appears distressed.  HENT:  Head: Normocephalic and atraumatic.  Neck: Neck supple.  Cardiovascular: Normal rate, regular rhythm and normal heart sounds.   Pulmonary/Chest: Breath sounds normal. No respiratory distress. She has no wheezes. She has no rales. She exhibits no tenderness.  Musculoskeletal:       Cervical back: She exhibits decreased range of motion, tenderness, bony tenderness, pain and spasm. She exhibits no deformity, no laceration and normal pulse.       Thoracic back: Normal.       Lumbar back: Normal.       Arms:  Right upper back tender to palpation, tender even to light touch.  No rash or skin changes.    Extremities: strength 5/5, sensation intact, distal pulses intact.   Neurological: She is alert and oriented to person, place, and time.  Skin: She is not diaphoretic.    ED Course  Procedures (including critical care time)  Labs Reviewed - No data to display Dg Cervical Spine Complete  01/20/2012  *RADIOLOGY REPORT*  Clinical Data: Neck and shoulder pain.  No known injury.  CERVICAL SPINE - COMPLETE 4+ VIEW  Comparison: None  Findings: The lateral film demonstrates normal alignment of the cervical vertebral bodies.  Disc spaces and vertebral bodies are maintained.  No acute bony findings or abnormal prevertebral soft tissue swelling.  The  oblique films demonstrate normally aligned articular facets and patent neural foramen.  The C1-C2 articulations are maintained. The lung apices are clear.  IMPRESSION: Normal alignment and no acute bony findings.  Original Report Authenticated By: P. Loralie Champagne, M.D.   11:52 PM Pt reports valium only slightly helped her pain.  Pt is tender even to slight touch and with any movement.    Checked DEA database.  Pt has had multiple narcotic prescriptions from various sources but not for several months.    1. Upper back pain on right side   2. Neck pain       MDM  Patient with no PCP presenting for third time with same problem.  Pt with pain in bilateral neck and bilateral upper back, worse on right.  On exam, she is very tender on the right, and tender even to light touch.  Pt may be having severe muscle spasm - appears to be in spasm - but the light touch tenderness and description of burning pain seems more neuropathic.  There is no weakness of her arms and she is neurologically intact.  No evidence of herpes zoster.  Pt given many resources for PCP follow up, norco #20, valium #12.  Patient verbalizes understanding and agrees with plan.          Dillard Cannon [place], [place] 01/21/12 0006

## 2012-01-20 NOTE — ED Notes (Signed)
Patient states she is having neck, shoulder and back pain. Denies injury. States she has been seen here multiple times for the same and has been referred and states she did not follow-up because of $145 office.

## 2012-01-20 NOTE — ED Notes (Signed)
Friend with pt keeps commenting due to no insurance is the reason it is taking so long to be seen, explained several times Insurance or ability to pay has no influence on care.

## 2012-01-21 NOTE — Discharge Instructions (Signed)
Please use the resources listed below to find a primary care provider for follow up.  If you develop rash, fever,  weakness of your hands, or uncontrolled pain, return to the ER for a recheck.  You may return to the ER at any time for worsening condition or any new symptoms that concern you.   If you have no primary doctor, here are some resources that may be helpful:  Medicaid-accepting [facility] Providers:   - [facility]- [address]      [phone]      Mon-Fri 9am-7pm, Sat 9am-1pm   - [facility]- [address] [place], [place]      [phone]   - [facility]- [address], [address] [place]      [phone]   [facility]- [address]      [phone]   - Renaye Rakers- [address] [place], [address]      [phone]      Only accepts [place] patients       after they have her name applied to their card   Self Pay (no insurance) in [place]:   - Sickle Cell Patients: Dr Willey Blade, [facility] Internal Medicine      [address] [place]      [phone]   - Health [phone]   - Physician Referral Service- [phone]   - [facility]- [address] [place]      [phone]   [facility] [place]- [address]   - [facility]- see information above      (Speak to [employer] if you do not have insurance)   - [facility]- [address] [place]      [phone]   - [facility] [place]- [address]      [phone]   - [facility]- [address]      [phone]   - Dr Julio Sicks-  [address], [place]      [phone]   - [facility]- [address]      [phone]   - [facility]- [address]      [phone]      Also [address]      [phone]   - [facility]- [address]      [phone]      1st and 3rd Saturday every month,  10am-1pm Other agencies that provide inexpensive medical care:    [facility]  [phone]    [facility]  [phone]    [facility]  [phone]    [facility]  [phone]    [facility]  [phone]  General Information: Finding a doctor when you do not have health insurance can be tricky. Although you are not limited by an insurance plan, you are of course limited by her finances and how much but he can pay out of pocket.  What are your options if you don't have health insurance?   1) Find a Librarian, academic and Pay Out of Pocket Although you won't have to find out who is covered by your insurance plan, it is a good idea to ask around and get recommendations. You will then need to call the office and see if  the doctor you have chosen will accept you as a new patient and what types of options they offer for patients who are self-pay. Some doctors offer discounts or will set up payment plans for their patients who do not have insurance, but you will need to ask so you aren't surprised when you get to your appointment.  2) Contact Your Local Health Department Not all health departments have doctors that can see patients for sick visits, but many do, so it is worth a call to see if yours does. If you don't know where your local health department is, you can check in your phone book. The CDC also has a tool to help you locate your state's health department, and many state websites also have listings of all of their local health departments.  3) Find a Walk-in Clinic If your illness is not likely to be very severe or complicated, you may want to try a walk in clinic. These are popping up all over the country in pharmacies, drugstores, and shopping centers. They're usually staffed by nurse practitioners or physician assistants that have been trained to treat common illnesses and complaints. They're usually fairly quick and inexpensive. However, if you have serious medical  issues or chronic medical problems, these are probably not your best option   RESOURCE GUIDE  Chronic Pain Problems: Contact [facility]  [phone] Patients need to be referred by their primary care doctor.  Insufficient Money for Medicine: Contact United Way:  call "211" or [facility] [phone].  No Primary Care Doctor: - Call Health Connect  [phone] - can help you locate a primary care doctor that  accepts your insurance, provides certain services, etc. - Physician Referral [phone]  Agencies that provide inexpensive medical care: - [facility]  [phone] - [facility]  [phone] - [facility]  [phone] [facility]  [phone] - Planned Parenthood  [phone] [facility]  [phone]  Medicaid-accepting [facility] Providers: - [facility]- [address]  [phone], Mon-Fri 9am-7pm, Sat 9am-1pm - [facility] - [facility]- [address] [place], [address] [place]  [phone] - [facility]- [address] [place]  [phone] [facility]- [address]  [phone] - Renaye Rakers- [address] [place], [address], [phone]  Only accepts [place] patients after they have their name  applied to their card  Self Pay (no insurance) in [place]: - Sickle Cell Patients: Dr Willey Blade, [facility] Medicine  [address] [place], [phone] - [facility]- [address] [place]  [phone]       [facility] [place]- [address]       -     [facility]- see information above (Speak to [employer] if you do not have insurance)       -  [facility]- [address] [place], [phone]       -  [facility]- [address],  [phone]       -  [facility]- [address] [phone]       -  Dr Julio Sicks-  [address], [place], [phone]       -  [facility]- [address], [phone]       -  [facility]- [address] [phone], also [address], [phone]       -    [facility]- [address] [place], [phone], 1st & 3rd Saturday   every month, 10am-1pm  1) Find a Doctor and Pay Out of Pocket Although you won't have to find out who is covered by your insurance plan, it is a good idea to ask around and get recommendations. You will then need to call the office and see if the doctor you have chosen will accept you as a new patient and what types of options they offer for patients who are self-pay. Some doctors offer discounts or will set up payment plans for their patients who do not have insurance, but you will need to ask so you aren't surprised when you get to your appointment.  2) Contact Your Local Health Department Not all health departments have doctors that can see patients for sick visits, but many do, so it is worth a call to see if yours does. If you don't know where your local health department is, you can check in your phone book. The CDC also has a tool to help you locate your state's health department, and many state websites also have listings of all of their local health departments.  3) Find a Walk-in Clinic If your illness is not likely to be very severe or complicated, you may want to try a walk in clinic. These are popping up all over the country in pharmacies, drugstores, and shopping centers. They're usually staffed by nurse practitioners or physician assistants that have been trained to treat common illnesses and complaints. They're usually fairly quick and inexpensive. However, if you have serious medical issues or chronic medical problems, these are probably not your best option  STD Testing - [facility] of [facility] [place], STD Clinic, [address] [place], phone [phone] or  [phone].  Monday - Friday, call for an appointment. [facility] of [employer], STD Clinic, [address], [place], phone [phone] or [phone].  Monday - Friday, call for an appointment.  Abuse/Neglect: [facility] Child Abuse Hotline 830-239-6052 [facility] Child Abuse Hotline 702-705-2430 (After Hours)  Emergency Shelter:  [facility] 670-807-1904  Maternity Homes: - [facility] at [facility] [place] [facility] [phone] - [facility] [phone]  MRSA Hotline #:   815-692-1070  [facility] Resources  [facility] [place]  [employer] [facility] Dept. [address]                 [address]         [address] [place] [address]  [place]                                               Cristobal Goldmann Phone:  [phone]                                  Phone:  [phone]  Phone:  [phone]  [facility], [phone] - [facility] - [phone]       -     [facility] in [place], [address]                                  [phone], [facility] Child Abuse Hotline [phone] or [phone] (After Hours)   Behavioral Health Services  Substance Abuse Resources: - [facility] Drug [facility]  4246572788 - [facility] [phone] - [facility] [place] [phone] Floydene Flock [phone] - Residential & Outpatient Substance Abuse Program  [phone]  Psychological Services: [facility]  [phone] [facility] Services  [phone] - [facility], [phone] [place]. [address] [place], ACCESS LINE: 8636967029 or [phone], [email or web address]  Dental Assistance  If unable to pay or uninsured, contact:  [facility] or  [facility]. to become qualified for the adult dental clinic.  Patients with Medicaid: [facility] [phone] [address], [phone] [address] [phone]  If unable to pay, or uninsured, contact [facility] [phone]) or [facility] Department [phone] in [place], [phone] in [facility]) to become qualified for the adult dental clinic  Other Low-Cost Community Dental Services: - [facility]- [address] [place], [place], [place], [address], [phone], [phone], 2nd and 4th Thursday of the month at 6:30am.  10 clients each day by appointment, can sometimes see walk-in patients if someone does not show for an appointment. [facility]- [address] [place] Griffins [place], [place], [address], [phone] - [facility]- [address], [place], [place], [address], [phone] - [facility]- [phone] [facility] Department- [phone] [facility] Department- [phone]

## 2012-01-21 NOTE — ED Provider Notes (Signed)
Medical screening examination/treatment/procedure(s) were performed by non-physician practitioner and as supervising physician I was immediately available for consultation/collaboration.  Gabino Hagin, MD 01/21/12 0331 

## 2012-02-20 ENCOUNTER — Encounter (HOSPITAL_BASED_OUTPATIENT_CLINIC_OR_DEPARTMENT_OTHER): Payer: Self-pay | Admitting: Emergency Medicine

## 2012-02-20 ENCOUNTER — Emergency Department (HOSPITAL_BASED_OUTPATIENT_CLINIC_OR_DEPARTMENT_OTHER): Payer: Self-pay

## 2012-02-20 ENCOUNTER — Emergency Department (HOSPITAL_BASED_OUTPATIENT_CLINIC_OR_DEPARTMENT_OTHER)
Admission: EM | Admit: 2012-02-20 | Discharge: 2012-02-20 | Disposition: A | Discharge status: Home / Self Care | Payer: Self-pay | Attending: Emergency Medicine | Admitting: Emergency Medicine

## 2012-02-20 DIAGNOSIS — F341 Dysthymic disorder: Secondary | ICD-10-CM | POA: Insufficient documentation

## 2012-02-20 DIAGNOSIS — S60229A Contusion of unspecified hand, initial encounter: Secondary | ICD-10-CM | POA: Insufficient documentation

## 2012-02-20 DIAGNOSIS — Y92009 Unspecified place in unspecified non-institutional (private) residence as the place of occurrence of the external cause: Secondary | ICD-10-CM | POA: Insufficient documentation

## 2012-02-20 DIAGNOSIS — S60221A Contusion of right hand, initial encounter: Secondary | ICD-10-CM

## 2012-02-20 DIAGNOSIS — F17200 Nicotine dependence, unspecified, uncomplicated: Secondary | ICD-10-CM | POA: Insufficient documentation

## 2012-02-20 DIAGNOSIS — W19XXXA Unspecified fall, initial encounter: Secondary | ICD-10-CM | POA: Insufficient documentation

## 2012-02-20 MED ORDER — HYDROCODONE-ACETAMINOPHEN 5-325 MG PO TABS: 2.0000 | ORAL_TABLET | ORAL | Status: AC | PRN

## 2012-02-20 MED ORDER — HYDROCODONE-ACETAMINOPHEN 5-325 MG PO TABS
2.0000 | ORAL_TABLET | Freq: Once | ORAL | Status: AC
  Administered 2012-02-20: 2 via ORAL
  Filled 2012-02-20: qty 2

## 2012-02-20 NOTE — ED Provider Notes (Signed)
History     CSN: [id number]  Arrival date & time 02/20/12  1254   First MD Initiated Contact with Patient 02/20/12 1409      Chief Complaint  Patient presents with  . Hand Injury  . Fall    (Consider location/radiation/quality/duration/timing/severity/associated sxs/prior treatment) Patient is a 41 y.o. female presenting with hand injury. The history is provided by the patient. No language interpreter was used.  Hand Injury  The incident occurred 6 to 12 hours ago. The incident occurred at home. The injury mechanism was a fall. The pain is present in the right hand. The pain is moderate. The pain has been constant since the incident. She reports no foreign bodies present. The symptoms are aggravated by movement. She has tried ice for the symptoms. The treatment provided no relief.    Past Medical History  Diagnosis Date  . Abscess   . Bartholin cyst   . Depression     History - no current prob per pt.  . Anxiety     History - no current prob per pt.    Past Surgical History  Procedure Date  . Tubal ligation   . Leep   . Incison and drainage     Bartholin cyst  . Wisdom tooth extraction   . Svd     x 3  . Bartholin cyst marsupialization 12/03/2011    Procedure: BARTHOLIN CYST MARSUPIALIZATION;  Surgeon: Catalina Antigua, MD;  Location: [facility];  Service: Gynecology;  Laterality: Left;  Bartholin Abcess    Family History  Problem Relation Age of Onset  . Cancer Mother   . Asthma Son     History  Substance Use Topics  . Smoking status: Current Everyday Smoker -- 0.5 packs/day for 24 years    Types: Cigarettes  . Smokeless tobacco: Never Used  . Alcohol Use: No    OB History    Grav Para Term Preterm Abortions TAB SAB Ect Mult Living   4 3 3  1 1    2       Review of Systems  Musculoskeletal: Positive for joint swelling.  Skin: Positive for color change.  All other systems reviewed and are negative.    Allergies  Review of patient's allergies indicates no  known allergies.  Home Medications  No current outpatient prescriptions on file.  BP 126/91  Pulse 98  Temp 98 F (36.7 C) (Oral)  Resp 16  Ht 5\' 2"  (1.575 m)  Wt 145 lb (65.772 kg)  BMI 26.52 kg/m2  SpO2 98%  LMP 02/06/2012  Physical Exam  Nursing note and vitals reviewed. Constitutional: She appears well-developed and well-nourished.  Musculoskeletal: She exhibits tenderness.       Swollen bruised right hand,  Decreased range of motion of thumb.  nv and ns intact  Neurological: She is alert. She has normal reflexes.  Skin: Skin is warm and dry.  Psychiatric: She has a normal mood and affect.    ED Course  Procedures (including critical care time)  Labs Reviewed - No data to display Dg Wrist Complete Right  02/20/2012  *RADIOLOGY REPORT*  Clinical Data: Right wrist pain following a fall.  RIGHT WRIST - COMPLETE 3+ VIEW  Comparison: None.  Findings: Normal appearing bones and soft tissues without fracture or dislocation.  IMPRESSION: Normal examination.  Original Report Authenticated By: Darrol Angel, M.D.   Dg Finger Thumb Right  02/20/2012  *RADIOLOGY REPORT*  Clinical Data: Right thumb pain in the region of the first  MCP joint following a fall.  RIGHT THUMB 2+V  Comparison: Right wrist obtained at the same time.  Findings: Normal appearing bones and soft tissues without fracture or dislocation.  IMPRESSION: Normal examination.  Original Report Authenticated By: Darrol Angel, M.D.     1. Contusion of right hand       MDM  Pt placed in a thumb spica,  Pt given hydrocodone 10 tablets.   Pt advised to follow up with Dr. Pearletha Forge if any problems.        Lonia Skinner [place], [place] 02/20/12 1441  Lonia Skinner [place], [place] 02/20/12 [phone]

## 2012-02-20 NOTE — ED Notes (Signed)
Pt was walking at night, tripped and fell.  C/o right hand/wrist pain.  Pt denies head injury.

## 2012-02-21 NOTE — ED Provider Notes (Signed)
Medical screening examination/treatment/procedure(s) were performed by non-physician practitioner and as supervising physician I was immediately available for consultation/collaboration.   Loren Racer, MD 02/21/12 [phone]

## 2012-05-04 ENCOUNTER — Encounter (HOSPITAL_COMMUNITY): Payer: Self-pay | Admitting: Emergency Medicine

## 2012-05-04 ENCOUNTER — Emergency Department (HOSPITAL_COMMUNITY)
Admission: EM | Admit: 2012-05-04 | Discharge: 2012-05-04 | Disposition: A | Discharge status: Home / Self Care | Payer: Self-pay | Attending: Emergency Medicine | Admitting: Emergency Medicine

## 2012-05-04 DIAGNOSIS — F329 Major depressive disorder, single episode, unspecified: Secondary | ICD-10-CM | POA: Insufficient documentation

## 2012-05-04 DIAGNOSIS — F3289 Other specified depressive episodes: Secondary | ICD-10-CM | POA: Insufficient documentation

## 2012-05-04 DIAGNOSIS — Z809 Family history of malignant neoplasm, unspecified: Secondary | ICD-10-CM | POA: Insufficient documentation

## 2012-05-04 DIAGNOSIS — F17200 Nicotine dependence, unspecified, uncomplicated: Secondary | ICD-10-CM | POA: Insufficient documentation

## 2012-05-04 DIAGNOSIS — Z825 Family history of asthma and other chronic lower respiratory diseases: Secondary | ICD-10-CM | POA: Insufficient documentation

## 2012-05-04 DIAGNOSIS — G8929 Other chronic pain: Secondary | ICD-10-CM | POA: Insufficient documentation

## 2012-05-04 DIAGNOSIS — K089 Disorder of teeth and supporting structures, unspecified: Secondary | ICD-10-CM | POA: Insufficient documentation

## 2012-05-04 DIAGNOSIS — F411 Generalized anxiety disorder: Secondary | ICD-10-CM | POA: Insufficient documentation

## 2012-05-04 MED ORDER — HYDROCODONE-ACETAMINOPHEN 5-500 MG PO TABS: 1.0000 | ORAL_TABLET | Freq: Four times a day (QID) | ORAL | Status: DC | PRN

## 2012-05-04 MED ORDER — PENICILLIN V POTASSIUM 500 MG PO TABS
500.0000 mg | ORAL_TABLET | Freq: Once | ORAL | Status: AC
  Administered 2012-05-04: 500 mg via ORAL
  Filled 2012-05-04: qty 1

## 2012-05-04 MED ORDER — IBUPROFEN 800 MG PO TABS
800.0000 mg | ORAL_TABLET | Freq: Once | ORAL | Status: AC
  Administered 2012-05-04: 800 mg via ORAL
  Filled 2012-05-04: qty 1

## 2012-05-04 MED ORDER — PENICILLIN V POTASSIUM 500 MG PO TABS: 500.0000 mg | ORAL_TABLET | Freq: Four times a day (QID) | ORAL | Status: AC

## 2012-05-04 MED ORDER — HYDROCODONE-ACETAMINOPHEN 5-325 MG PO TABS
1.0000 | ORAL_TABLET | Freq: Once | ORAL | Status: AC
  Administered 2012-05-04: 1 via ORAL
  Filled 2012-05-04: qty 1

## 2012-05-04 NOTE — ED Provider Notes (Signed)
History     CSN: [id number]  Arrival date & time 05/04/12  7829   First MD Initiated Contact with Patient 05/04/12 1029      Chief Complaint  Patient presents with  . Dental Pain    pt reports 2 teeth, painful x 3 days    (Consider location/radiation/quality/duration/timing/severity/associated sxs/prior treatment) Patient is a 41 y.o. female presenting with tooth pain. The history is provided by the patient. No language interpreter was used.  Dental PainThe primary symptoms include mouth pain. Primary symptoms do not include fever, shortness of breath or sore throat. The symptoms began more than 1 month ago. The symptoms are worsening. The symptoms are recurrent. The symptoms occur constantly.  Additional symptoms include: dental sensitivity to temperature, gum swelling and gum tenderness. Additional symptoms do not include: trismus, facial swelling, trouble swallowing and drooling.  Bilateral lower incisor pain with cavities.  Gums slightly swollentender and reddened.  Past Medical History  Diagnosis Date  . Abscess   . Bartholin cyst   . Depression     History - no current prob per pt.  . Anxiety     History - no current prob per pt.    Past Surgical History  Procedure Date  . Tubal ligation   . Leep   . Incison and drainage     Bartholin cyst  . Wisdom tooth extraction   . Svd     x 3  . Bartholin cyst marsupialization 12/03/2011    Procedure: BARTHOLIN CYST MARSUPIALIZATION;  Surgeon: Catalina Antigua, MD;  Location: [facility];  Service: Gynecology;  Laterality: Left;  Bartholin Abcess    Family History  Problem Relation Age of Onset  . Cancer Mother   . Asthma Son     History  Substance Use Topics  . Smoking status: Current Every Day Smoker -- 0.5 packs/day for 24 years    Types: Cigarettes  . Smokeless tobacco: Never Used  . Alcohol Use: Yes    OB History    Grav Para Term Preterm Abortions TAB SAB Ect Mult Living   4 3 3  1 1    2       Review of  Systems  Constitutional: Negative.  Negative for fever.  HENT: Positive for dental problem. Negative for sore throat, facial swelling, drooling and trouble swallowing.   Eyes: Negative.   Respiratory: Negative.  Negative for shortness of breath.   Cardiovascular: Negative.   Gastrointestinal: Negative.   Neurological: Negative.   Psychiatric/Behavioral: Negative.   All other systems reviewed and are negative.    Allergies  Review of patient's allergies indicates no known allergies.  Home Medications   Current Outpatient Rx  Name Route Sig Dispense Refill  . BC HEADACHE POWDER PO Oral Take by mouth once. Pain    . ADULT MULTIVITAMIN W/MINERALS CH Oral Take 1 tablet by mouth daily.      BP 128/84  Pulse 105  Temp 98.2 F (36.8 C) (Oral)  Resp 18  SpO2 98%  LMP 04/17/2012  Physical Exam  Nursing note and vitals reviewed. Constitutional: She is oriented to person, place, and time. She appears well-developed and well-nourished.  HENT:  Head: Normocephalic and atraumatic.  Mouth/Throat: Oropharynx is clear and moist and mucous membranes are normal.    Eyes: Conjunctivae normal and EOM are normal. Pupils are equal, round, and reactive to light.  Neck: Normal range of motion. Neck supple.  Cardiovascular: Normal rate.   Pulmonary/Chest: Effort normal.  Abdominal: Soft.  Musculoskeletal: Normal  range of motion. She exhibits no edema and no tenderness.  Neurological: She is alert and oriented to person, place, and time. She has normal reflexes.  Skin: Skin is warm and dry.  Psychiatric: She has a normal mood and affect.    ED Course  Procedures (including critical care time)  Labs Reviewed - No data to display No results found.   No diagnosis found.    MDM  Bilateral incisor pain with cavities and swoolen gums.  rx for pcn and vicodin.  Follow up with dentist within 48 hours.  Return if eye pain or fever/trismus.        Remi Haggard, NP 05/05/12 1600

## 2012-05-04 NOTE — ED Notes (Signed)
Pt reports bilat. Dental pain x 3 days. Hx of same. 1 tooth lower right, 1 tooth lower left

## 2012-05-08 NOTE — ED Provider Notes (Signed)
Medical screening examination/treatment/procedure(s) were performed by non-physician practitioner and as supervising physician I was immediately available for consultation/collaboration.  Flint Melter, MD 05/08/12 [phone]

## 2012-08-04 ENCOUNTER — Emergency Department (HOSPITAL_COMMUNITY)
Admission: EM | Admit: 2012-08-04 | Discharge: 2012-08-04 | Disposition: A | Discharge status: Home / Self Care | Payer: Self-pay | Attending: Emergency Medicine | Admitting: Emergency Medicine

## 2012-08-04 ENCOUNTER — Emergency Department (HOSPITAL_COMMUNITY): Payer: Self-pay

## 2012-08-04 ENCOUNTER — Encounter (HOSPITAL_COMMUNITY): Payer: Self-pay | Admitting: Emergency Medicine

## 2012-08-04 DIAGNOSIS — R079 Chest pain, unspecified: Secondary | ICD-10-CM | POA: Insufficient documentation

## 2012-08-04 DIAGNOSIS — L0231 Cutaneous abscess of buttock: Secondary | ICD-10-CM | POA: Insufficient documentation

## 2012-08-04 DIAGNOSIS — F17200 Nicotine dependence, unspecified, uncomplicated: Secondary | ICD-10-CM | POA: Insufficient documentation

## 2012-08-04 DIAGNOSIS — Z8659 Personal history of other mental and behavioral disorders: Secondary | ICD-10-CM | POA: Insufficient documentation

## 2012-08-04 DIAGNOSIS — J40 Bronchitis, not specified as acute or chronic: Secondary | ICD-10-CM

## 2012-08-04 DIAGNOSIS — J209 Acute bronchitis, unspecified: Secondary | ICD-10-CM | POA: Insufficient documentation

## 2012-08-04 DIAGNOSIS — Z8742 Personal history of other diseases of the female genital tract: Secondary | ICD-10-CM | POA: Insufficient documentation

## 2012-08-04 DIAGNOSIS — L0291 Cutaneous abscess, unspecified: Secondary | ICD-10-CM

## 2012-08-04 MED ORDER — OXYCODONE-ACETAMINOPHEN 5-325 MG PO TABS
1.0000 | ORAL_TABLET | Freq: Once | ORAL | Status: AC
  Administered 2012-08-04: 1 via ORAL
  Filled 2012-08-04: qty 1

## 2012-08-04 MED ORDER — OXYCODONE-ACETAMINOPHEN 5-325 MG PO TABS: 1.0000 | ORAL_TABLET | ORAL | Status: DC | PRN

## 2012-08-04 MED ORDER — FLUCONAZOLE 150 MG PO TABS: 200.0000 mg | ORAL_TABLET | Freq: Every day | ORAL | Status: AC

## 2012-08-04 MED ORDER — SULFAMETHOXAZOLE-TRIMETHOPRIM 800-160 MG PO TABS: 1.0000 | ORAL_TABLET | Freq: Two times a day (BID) | ORAL | Status: DC

## 2012-08-04 NOTE — ED Provider Notes (Signed)
History     CSN: [id number]  Arrival date & time 08/04/12  1018   First MD Initiated Contact with Patient 08/04/12 1238      Chief Complaint  Patient presents with  . Cough    (Consider location/radiation/quality/duration/timing/severity/associated sxs/prior treatment) Patient is a 41 y.o. female presenting with cough and abscess. The history is provided by the patient.  Cough This is a chronic problem. The current episode started more than 1 week ago. Associated symptoms include chest pain. Associated symptoms comments: Cough that has been persistent for the past 3 weeks. It happens every year. She reports being unable to smoke per her usual amount. No fever. Coughing becomes worse at night and keeps her from sleeping. No vomiting. .  Abscess  This is a new problem. The current episode started more than one week ago. The problem occurs continuously. The abscess is present on the right buttock. The problem is moderate. Associated symptoms include cough. Pertinent negatives include no fever. Associated symptoms comments: She states history of abscesses and that the current lesion opened and drained but re-accumulated and is now more painful and larger. No fever. .    Past Medical History  Diagnosis Date  . Abscess   . Bartholin cyst   . Depression     History - no current prob per pt.  . Anxiety     History - no current prob per pt.    Past Surgical History  Procedure Date  . Tubal ligation   . Leep   . Incison and drainage     Bartholin cyst  . Wisdom tooth extraction   . Svd     x 3  . Bartholin cyst marsupialization 12/03/2011    Procedure: BARTHOLIN CYST MARSUPIALIZATION;  Surgeon: Catalina Antigua, MD;  Location: [facility];  Service: Gynecology;  Laterality: Left;  Bartholin Abcess    Family History  Problem Relation Age of Onset  . Cancer Mother   . Asthma Son     History  Substance Use Topics  . Smoking status: Current Every Day Smoker -- 0.5 packs/day for 24  years    Types: Cigarettes  . Smokeless tobacco: Never Used  . Alcohol Use: Yes    OB History    Grav Para Term Preterm Abortions TAB SAB Ect Mult Living   4 3 3  1 1    2       Review of Systems  Constitutional: Negative for fever.  Respiratory: Positive for cough.   Cardiovascular: Positive for chest pain.       Complains of pain with cough. No SOB, but c/o pain with coughing only.  Musculoskeletal: Negative for back pain.  Skin: Positive for wound.    Allergies  Review of patient's allergies indicates no known allergies.  Home Medications   Current Outpatient Rx  Name  Route  Sig  Dispense  Refill  . DIPHENHYDRAMINE HCL 25 MG PO TABS   Oral   Take 25 mg by mouth every 6 (six) hours as needed. For allergies.         . ADULT MULTIVITAMIN W/MINERALS CH   Oral   Take 1 tablet by mouth daily.           BP 113/87  Pulse 102  Temp 98.7 F (37.1 C)  Resp 20  Ht 5\' 2"  (1.575 m)  Wt 140 lb (63.504 kg)  BMI 25.61 kg/m2  SpO2 98%  LMP 07/23/2012  Physical Exam  Constitutional: She appears well-developed and well-nourished.  No distress.  HENT:  Head: Normocephalic.  Mouth/Throat: Oropharynx is clear and moist.  Cardiovascular: Normal rate and regular rhythm.   No murmur heard. Pulmonary/Chest: Effort normal. No respiratory distress. She has no wheezes. She has no rales. She exhibits tenderness.  Abdominal: Soft. There is no tenderness.  Skin: There is erythema.       Large swollen and tender area to inferior left buttock without drainage. C/w abscess.   Psychiatric: She has a normal mood and affect.    ED Course  Procedures (including critical care time)  Labs Reviewed - No data to display Dg Chest 2 View  08/04/2012  *RADIOLOGY REPORT*  Clinical Data: Cough  CHEST - 2 VIEW  Comparison: 06/26/09  Findings: Cardiomediastinal silhouette is stable.  No acute infiltrate or pleural effusion.  No pulmonary edema.  Bony thorax is stable.  IMPRESSION: No active  disease.  No significant change.   Original Report Authenticated By: Natasha Mead, M.D.      No diagnosis found. 1. Cutaneous abscess 2. Bronchitis 3. Tobacco abuse    MDM  INCISION AND DRAINAGE Performed by: Langley Adie A Consent: Verbal consent obtained. Risks and benefits: risks, benefits and alternatives were discussed Type: abscess  Body area: left inferior buttock  Anesthesia: local infiltration  Incision was made with an #11 scalpel.  Local anesthetic: lidocaine 1% w/ epinephrine  Anesthetic total: 2 ml  Complexity: complex Blunt dissection to break up loculations  Drainage: purulent  Drainage amount: large  Packing material: 1/4 in iodoform gauze  Patient tolerance: Patient tolerated the procedure well with no immediate complications.   Uncomplicated abscess, I&D performed. URI symptoms in smoker of greater than 2 weeks duration, will treat with abx, inhaler, smoking cessation encouraged.          Rodena Medin, PA-C 08/04/12 1351

## 2012-08-04 NOTE — ED Provider Notes (Signed)
Medical screening examination/treatment/procedure(s) were performed by non-physician practitioner and as supervising physician I was immediately available for consultation/collaboration. Devoria Albe, MD, Armando Gang   Ward Givens, MD 08/04/12 [phone]

## 2012-08-04 NOTE — ED Notes (Signed)
Patient c/o cough and chills since the weekend after Thanksgiving.  Patient has been taking leftover ABX from home.  Patient denies fevers.  Patient reports pleuritic pain and back pain with cough.  Patient reports nausea and decreased intake.  Patient also reports boil in right groin.

## 2012-08-04 NOTE — Progress Notes (Signed)
Pt listed with no insurance coverage CM and [facility] liaison spoke with pt.  Pt offered services to assist with finding a [place] self pay provider & health reform information 

## 2012-08-28 ENCOUNTER — Emergency Department: Payer: Self-pay | Admitting: Emergency Medicine

## 2012-09-13 ENCOUNTER — Emergency Department: Payer: Self-pay | Admitting: Emergency Medicine

## 2012-09-13 LAB — URINALYSIS, COMPLETE
Bacteria: NONE SEEN
Bilirubin,UR: NEGATIVE
Blood: NEGATIVE
Glucose,UR: NEGATIVE mg/dL (ref 0–75)
Nitrite: NEGATIVE
Ph: 5 (ref 4.5–8.0)
Protein: NEGATIVE
RBC,UR: 7 /HPF (ref 0–5)
Specific Gravity: 1.028 (ref 1.003–1.030)
Squamous Epithelial: 4
WBC UR: 5 /HPF (ref 0–5)

## 2012-09-13 LAB — WET PREP, GENITAL

## 2012-09-19 LAB — WOUND CULTURE

## 2012-09-28 ENCOUNTER — Encounter (HOSPITAL_COMMUNITY): Payer: Self-pay | Admitting: Emergency Medicine

## 2012-09-28 ENCOUNTER — Emergency Department (HOSPITAL_COMMUNITY): Payer: Self-pay

## 2012-09-28 ENCOUNTER — Emergency Department (HOSPITAL_COMMUNITY)
Admission: EM | Admit: 2012-09-28 | Discharge: 2012-09-28 | Disposition: A | Discharge status: Home / Self Care | Payer: Self-pay | Attending: Emergency Medicine | Admitting: Emergency Medicine

## 2012-09-28 DIAGNOSIS — Y9389 Activity, other specified: Secondary | ICD-10-CM | POA: Insufficient documentation

## 2012-09-28 DIAGNOSIS — S2020XA Contusion of thorax, unspecified, initial encounter: Secondary | ICD-10-CM | POA: Insufficient documentation

## 2012-09-28 DIAGNOSIS — S301XXA Contusion of abdominal wall, initial encounter: Secondary | ICD-10-CM

## 2012-09-28 DIAGNOSIS — Z8742 Personal history of other diseases of the female genital tract: Secondary | ICD-10-CM | POA: Insufficient documentation

## 2012-09-28 DIAGNOSIS — Z872 Personal history of diseases of the skin and subcutaneous tissue: Secondary | ICD-10-CM | POA: Insufficient documentation

## 2012-09-28 DIAGNOSIS — B9689 Other specified bacterial agents as the cause of diseases classified elsewhere: Secondary | ICD-10-CM

## 2012-09-28 DIAGNOSIS — F17200 Nicotine dependence, unspecified, uncomplicated: Secondary | ICD-10-CM | POA: Insufficient documentation

## 2012-09-28 DIAGNOSIS — N760 Acute vaginitis: Secondary | ICD-10-CM | POA: Insufficient documentation

## 2012-09-28 DIAGNOSIS — Z8659 Personal history of other mental and behavioral disorders: Secondary | ICD-10-CM | POA: Insufficient documentation

## 2012-09-28 DIAGNOSIS — Y929 Unspecified place or not applicable: Secondary | ICD-10-CM | POA: Insufficient documentation

## 2012-09-28 DIAGNOSIS — Z3202 Encounter for pregnancy test, result negative: Secondary | ICD-10-CM | POA: Insufficient documentation

## 2012-09-28 DIAGNOSIS — W010XXA Fall on same level from slipping, tripping and stumbling without subsequent striking against object, initial encounter: Secondary | ICD-10-CM | POA: Insufficient documentation

## 2012-09-28 LAB — URINALYSIS, ROUTINE W REFLEX MICROSCOPIC
Bilirubin Urine: NEGATIVE
Glucose, UA: NEGATIVE mg/dL
Hgb urine dipstick: NEGATIVE
Ketones, ur: NEGATIVE mg/dL
Leukocytes, UA: NEGATIVE
Nitrite: NEGATIVE
Protein, ur: NEGATIVE mg/dL
Specific Gravity, Urine: 1.015 (ref 1.005–1.030)
Urobilinogen, UA: 0.2 mg/dL (ref 0.0–1.0)
pH: 7.5 (ref 5.0–8.0)

## 2012-09-28 LAB — POCT PREGNANCY, URINE: Preg Test, Ur: NEGATIVE

## 2012-09-28 LAB — WET PREP, GENITAL
Trich, Wet Prep: NONE SEEN
Yeast Wet Prep HPF POC: NONE SEEN

## 2012-09-28 MED ORDER — METRONIDAZOLE 500 MG PO TABS: 500.0000 mg | ORAL_TABLET | Freq: Three times a day (TID) | ORAL | Status: DC

## 2012-09-28 MED ORDER — OXYCODONE-ACETAMINOPHEN 5-325 MG PO TABS: 1.0000 | ORAL_TABLET | ORAL | Status: DC | PRN

## 2012-09-28 MED ORDER — OXYCODONE-ACETAMINOPHEN 5-325 MG PO TABS
2.0000 | ORAL_TABLET | Freq: Once | ORAL | Status: AC
  Administered 2012-09-28: 2 via ORAL
  Filled 2012-09-28: qty 2

## 2012-09-28 MED ORDER — FLUCONAZOLE 150 MG PO TABS: 150.0000 mg | ORAL_TABLET | Freq: Once | ORAL | Status: DC

## 2012-09-28 NOTE — ED Notes (Signed)
Patient transported to X-ray 

## 2012-09-28 NOTE — ED Notes (Signed)
Per patient, fell against railing of trailer on Sunday and bruised right side-also was treated for STD 2 weeks ago with antibiotic and states she now has yeast infection and/or STD not resolved

## 2012-09-28 NOTE — ED Provider Notes (Signed)
History     CSN: [id number]  Arrival date & time 09/28/12  1142   First MD Initiated Contact with Patient 09/28/12 1153      Chief Complaint  Patient presents with  . Flank Pain  . Vaginitis    (Consider location/radiation/quality/duration/timing/severity/associated sxs/prior treatment) HPI IYARI HAGNER is a 42 y.o. female who presents with complaint of injury to the right lower back after running into a rail while moving a couch. Stats pain around the bruised area. No medications tried. No other injuries. No numbness or weakness to extremities. No pain radiation. No abdominal pain. No blood in urine. No fever. States also having vaginal discharge. 2 wks ago was diagnosed with trichomonas, states finished antibiotics, symptoms did not improve. States no pain, just clear white vaginal discharge, no urinary symptoms.    Past Medical History  Diagnosis Date  . Abscess   . Bartholin cyst   . Depression     History - no current prob per pt.  . Anxiety     History - no current prob per pt.    Past Surgical History  Procedure Laterality Date  . Tubal ligation    . Leep    . Incison and drainage      Bartholin cyst  . Wisdom tooth extraction    . Svd      x 3  . Bartholin cyst marsupialization  12/03/2011    Procedure: BARTHOLIN CYST MARSUPIALIZATION;  Surgeon: Catalina Antigua, MD;  Location: [facility];  Service: Gynecology;  Laterality: Left;  Bartholin Abcess    Family History  Problem Relation Age of Onset  . Cancer Mother   . Asthma Son     History  Substance Use Topics  . Smoking status: Current Every Day Smoker -- 0.50 packs/day for 24 years    Types: Cigarettes  . Smokeless tobacco: Never Used  . Alcohol Use: Yes    OB History   Grav Para Term Preterm Abortions TAB SAB Ect Mult Living   4 3 3  1 1    2       Review of Systems  Constitutional: Negative for fever and chills.  HENT: Negative for neck pain and neck stiffness.   Respiratory: Negative.    Cardiovascular: Negative.   Gastrointestinal: Negative.   Genitourinary: Positive for flank pain and vaginal discharge. Negative for vaginal bleeding, vaginal pain and pelvic pain.  Musculoskeletal: Positive for back pain.  Skin: Negative for rash.  Neurological: Negative for weakness and numbness.  All other systems reviewed and are negative.    Allergies  Review of patient's allergies indicates no known allergies.  Home Medications   Current Outpatient Rx  Name  Route  Sig  Dispense  Refill  . diphenhydrAMINE (BENADRYL) 25 MG tablet   Oral   Take 25 mg by mouth every 6 (six) hours as needed. For allergies.         . metroNIDAZOLE (FLAGYL) 500 MG tablet   Oral   Take 500 mg by mouth 2 (two) times daily. Pt to take for 7 days. Finished on Friday 09-23-12           BP 113/76  Pulse 91  Temp(Src) 98 F (36.7 C) (Oral)  Resp 18  SpO2 100%  LMP 09/14/2012  Physical Exam  Nursing note and vitals reviewed. Constitutional: She is oriented to person, place, and time. She appears well-developed and well-nourished. No distress.  HENT:  Head: Normocephalic.  Neck: Neck supple.  Cardiovascular: Normal rate,  regular rhythm and normal heart sounds.   Pulmonary/Chest: Effort normal and breath sounds normal. No respiratory distress. She has no wheezes. She has no rales.  Abdominal: Soft. Bowel sounds are normal. She exhibits no distension. There is no tenderness. There is no rebound.  Right flank contusion. Tender to palpation.   Genitourinary:  Normal external genitalia. White vaginal discharge. No cmt. No adnexal tenderness  Neurological: She is alert and oriented to person, place, and time.  Skin: Skin is warm and dry.    ED Course  Procedures (including critical care time)  Pt with right flank contusion and vaginal discharge. No abdominal tenderness on exam, doubt intra abdominal injury will get x-ray to r/o rib fracture, ua, pelvic exam.  Results for orders placed  during the hospital encounter of 09/28/12  WET PREP, GENITAL      Result Value Range   Yeast Wet Prep HPF POC NONE SEEN  NONE SEEN   Trich, Wet Prep NONE SEEN  NONE SEEN   Clue Cells Wet Prep HPF POC MODERATE (*) NONE SEEN   WBC, Wet Prep HPF POC FEW (*) NONE SEEN  URINALYSIS, ROUTINE W REFLEX MICROSCOPIC      Result Value Range   Color, Urine YELLOW  YELLOW   APPearance CLEAR  CLEAR   Specific Gravity, Urine 1.015  1.005 - 1.030   pH 7.5  5.0 - 8.0   Glucose, UA NEGATIVE  NEGATIVE mg/dL   Hgb urine dipstick NEGATIVE  NEGATIVE   Bilirubin Urine NEGATIVE  NEGATIVE   Ketones, ur NEGATIVE  NEGATIVE mg/dL   Protein, ur NEGATIVE  NEGATIVE mg/dL   Urobilinogen, UA 0.2  0.0 - 1.0 mg/dL   Nitrite NEGATIVE  NEGATIVE   Leukocytes, UA NEGATIVE  NEGATIVE  POCT PREGNANCY, URINE      Result Value Range   Preg Test, Ur NEGATIVE  NEGATIVE   Dg Ribs Unilateral W/chest Right  09/28/2012  *RADIOLOGY REPORT*  Clinical Data: Flank pain  RIGHT RIBS AND CHEST - 3+ VIEW  Comparison: 08/04/12  Findings: 3  views right ribs submitted.  Cardiomediastinal silhouette is stable.  No acute infiltrate or pulmonary edema.  No right rib fracture is identified.  No diagnostic pneumothorax.  IMPRESSION: No right rib fracture is identified.  No diagnostic pneumothorax.   Original Report Authenticated By: Natasha Mead, M.D.       1. BV (bacterial vaginosis)   2. Contusion of flank       MDM  Pt with right flank injury, vaginal discharge. No abdominal tenderness, negative UA. X-ray negative for fracture. Pelvic exam unremarkable. Moderate clue cells. Suspect BV, will start on flagyl. Given diflucan as well, due to pt's irritation and itching. Will d/c home.         Lottie Mussel, PA 09/28/12 1918

## 2012-09-30 LAB — GC/CHLAMYDIA PROBE AMP
CT Probe RNA: NEGATIVE
GC Probe RNA: NEGATIVE

## 2012-09-30 NOTE — ED Provider Notes (Signed)
Medical screening examination/treatment/procedure(s) were performed by non-physician practitioner and as supervising physician I was immediately available for consultation/collaboration.   Nelia Shi, MD 09/30/12 [phone]

## 2012-11-01 ENCOUNTER — Emergency Department: Payer: Self-pay | Admitting: Emergency Medicine

## 2012-12-24 ENCOUNTER — Emergency Department (HOSPITAL_COMMUNITY): Payer: Self-pay

## 2012-12-24 ENCOUNTER — Encounter (HOSPITAL_COMMUNITY): Payer: Self-pay

## 2012-12-24 ENCOUNTER — Emergency Department (HOSPITAL_COMMUNITY)
Admission: EM | Admit: 2012-12-24 | Discharge: 2012-12-24 | Disposition: A | Discharge status: Home / Self Care | Payer: Self-pay | Attending: Emergency Medicine | Admitting: Emergency Medicine

## 2012-12-24 DIAGNOSIS — Z8709 Personal history of other diseases of the respiratory system: Secondary | ICD-10-CM | POA: Insufficient documentation

## 2012-12-24 DIAGNOSIS — R21 Rash and other nonspecific skin eruption: Secondary | ICD-10-CM | POA: Insufficient documentation

## 2012-12-24 DIAGNOSIS — Z8659 Personal history of other mental and behavioral disorders: Secondary | ICD-10-CM | POA: Insufficient documentation

## 2012-12-24 DIAGNOSIS — L02219 Cutaneous abscess of trunk, unspecified: Secondary | ICD-10-CM | POA: Insufficient documentation

## 2012-12-24 DIAGNOSIS — R062 Wheezing: Secondary | ICD-10-CM | POA: Insufficient documentation

## 2012-12-24 DIAGNOSIS — Z9889 Other specified postprocedural states: Secondary | ICD-10-CM | POA: Insufficient documentation

## 2012-12-24 DIAGNOSIS — F17200 Nicotine dependence, unspecified, uncomplicated: Secondary | ICD-10-CM | POA: Insufficient documentation

## 2012-12-24 DIAGNOSIS — Z79899 Other long term (current) drug therapy: Secondary | ICD-10-CM | POA: Insufficient documentation

## 2012-12-24 DIAGNOSIS — R0602 Shortness of breath: Secondary | ICD-10-CM | POA: Insufficient documentation

## 2012-12-24 DIAGNOSIS — R350 Frequency of micturition: Secondary | ICD-10-CM | POA: Insufficient documentation

## 2012-12-24 DIAGNOSIS — Z9851 Tubal ligation status: Secondary | ICD-10-CM | POA: Insufficient documentation

## 2012-12-24 DIAGNOSIS — R109 Unspecified abdominal pain: Secondary | ICD-10-CM | POA: Insufficient documentation

## 2012-12-24 DIAGNOSIS — L0291 Cutaneous abscess, unspecified: Secondary | ICD-10-CM

## 2012-12-24 DIAGNOSIS — R0789 Other chest pain: Secondary | ICD-10-CM | POA: Insufficient documentation

## 2012-12-24 DIAGNOSIS — Z8742 Personal history of other diseases of the female genital tract: Secondary | ICD-10-CM | POA: Insufficient documentation

## 2012-12-24 DIAGNOSIS — R079 Chest pain, unspecified: Secondary | ICD-10-CM

## 2012-12-24 DIAGNOSIS — Z872 Personal history of diseases of the skin and subcutaneous tissue: Secondary | ICD-10-CM | POA: Insufficient documentation

## 2012-12-24 DIAGNOSIS — R110 Nausea: Secondary | ICD-10-CM | POA: Insufficient documentation

## 2012-12-24 HISTORY — DX: Bronchitis, not specified as acute or chronic: J40

## 2012-12-24 LAB — CBC WITH DIFFERENTIAL/PLATELET
Basophils Absolute: 0 10*3/uL (ref 0.0–0.1)
Basophils Relative: 0 % (ref 0–1)
Eosinophils Absolute: 0.3 10*3/uL (ref 0.0–0.7)
Eosinophils Relative: 2 % (ref 0–5)
HCT: 46.4 % — ABNORMAL HIGH (ref 36.0–46.0)
Hemoglobin: 15.9 g/dL — ABNORMAL HIGH (ref 12.0–15.0)
Lymphocytes Relative: 27 % (ref 12–46)
Lymphs Abs: 3.1 10*3/uL (ref 0.7–4.0)
MCH: 30.9 pg (ref 26.0–34.0)
MCHC: 34.3 g/dL (ref 30.0–36.0)
MCV: 90.1 fL (ref 78.0–100.0)
Monocytes Absolute: 0.8 10*3/uL (ref 0.1–1.0)
Monocytes Relative: 7 % (ref 3–12)
Neutro Abs: 7.3 10*3/uL (ref 1.7–7.7)
Neutrophils Relative %: 63 % (ref 43–77)
Platelets: 297 10*3/uL (ref 150–400)
RBC: 5.15 MIL/uL — ABNORMAL HIGH (ref 3.87–5.11)
RDW: 13.1 % (ref 11.5–15.5)
WBC: 11.4 10*3/uL — ABNORMAL HIGH (ref 4.0–10.5)

## 2012-12-24 LAB — COMPREHENSIVE METABOLIC PANEL
ALT: 19 U/L (ref 0–35)
AST: 17 U/L (ref 0–37)
Albumin: 3.7 g/dL (ref 3.5–5.2)
Alkaline Phosphatase: 100 U/L (ref 39–117)
BUN: 4 mg/dL — ABNORMAL LOW (ref 6–23)
CO2: 28 mEq/L (ref 19–32)
Calcium: 9.5 mg/dL (ref 8.4–10.5)
Chloride: 103 mEq/L (ref 96–112)
Creatinine, Ser: 0.48 mg/dL — ABNORMAL LOW (ref 0.50–1.10)
GFR calc Af Amer: >90 mL/min (ref >90)
GFR calc non Af Amer: >90 mL/min (ref >90)
Glucose, Bld: 97 mg/dL (ref 70–99)
Potassium: 4.2 mEq/L (ref 3.5–5.1)
Sodium: 139 mEq/L (ref 135–145)
Total Bilirubin: 0.2 mg/dL — ABNORMAL LOW (ref 0.3–1.2)
Total Protein: 7.6 g/dL (ref 6.0–8.3)

## 2012-12-24 LAB — URINALYSIS, ROUTINE W REFLEX MICROSCOPIC
Bilirubin Urine: NEGATIVE
Glucose, UA: NEGATIVE mg/dL
Hgb urine dipstick: NEGATIVE
Ketones, ur: NEGATIVE mg/dL
Nitrite: NEGATIVE
Protein, ur: NEGATIVE mg/dL
Specific Gravity, Urine: 1.008 (ref 1.005–1.030)
Urobilinogen, UA: 0.2 mg/dL (ref 0.0–1.0)
pH: 7 (ref 5.0–8.0)

## 2012-12-24 LAB — WET PREP, GENITAL
Trich, Wet Prep: NONE SEEN
Yeast Wet Prep HPF POC: NONE SEEN

## 2012-12-24 LAB — URINE MICROSCOPIC-ADD ON

## 2012-12-24 LAB — LIPASE, BLOOD: Lipase: 27 U/L (ref 11–59)

## 2012-12-24 MED ORDER — HYDROMORPHONE HCL PF 1 MG/ML IJ SOLN
1.0000 mg | Freq: Once | INTRAMUSCULAR | Status: AC
  Administered 2012-12-24: 1 mg via INTRAVENOUS
  Filled 2012-12-24: qty 1

## 2012-12-24 MED ORDER — SODIUM CHLORIDE 0.9 % IV SOLN
1000.0000 mL | Freq: Once | INTRAVENOUS | Status: AC
  Administered 2012-12-24: 1000 mL via INTRAVENOUS

## 2012-12-24 MED ORDER — OXYCODONE-ACETAMINOPHEN 5-325 MG PO TABS: ORAL_TABLET | ORAL | Status: DC

## 2012-12-24 MED ORDER — ASPIRIN 81 MG PO CHEW
324.0000 mg | CHEWABLE_TABLET | Freq: Once | ORAL | Status: AC
  Administered 2012-12-24: 324 mg via ORAL
  Filled 2012-12-24: qty 4

## 2012-12-24 MED ORDER — LIDOCAINE-EPINEPHRINE 2 %-1:100000 IJ SOLN: 20.0000 mL | Freq: Once | INTRAMUSCULAR | Status: DC

## 2012-12-24 MED ORDER — SODIUM CHLORIDE 0.9 % IV SOLN: 1000.0000 mL | INTRAVENOUS | Status: DC

## 2012-12-24 MED ORDER — KETOROLAC TROMETHAMINE 30 MG/ML IJ SOLN
30.0000 mg | Freq: Once | INTRAMUSCULAR | Status: AC
  Administered 2012-12-24: 30 mg via INTRAVENOUS
  Filled 2012-12-24: qty 1

## 2012-12-24 MED ORDER — PROMETHAZINE HCL 25 MG PO TABS: 25.0000 mg | ORAL_TABLET | Freq: Four times a day (QID) | ORAL | Status: DC | PRN

## 2012-12-24 MED ORDER — LIDOCAINE-EPINEPHRINE (PF) 1 %-1:200000 IJ SOLN
INTRAMUSCULAR | Status: AC
  Filled 2012-12-24: qty 10

## 2012-12-24 NOTE — ED Provider Notes (Signed)
Medical screening examination/treatment/procedure(s) were performed by non-physician practitioner and as supervising physician I was immediately available for consultation/collaboration.  Ethelda Chick, MD 12/24/12 [phone]

## 2012-12-24 NOTE — ED Notes (Signed)
Patient stated that chest pain is 6/10. Is not currently having any chest pain.

## 2012-12-24 NOTE — ED Provider Notes (Signed)
History     CSN: [id number]  Arrival date & time 12/24/12  1048   First MD Initiated Contact with Patient 12/24/12 1121      Chief Complaint  Patient presents with  . Abdominal Pain    (Consider location/radiation/quality/duration/timing/severity/associated sxs/prior treatment) HPI  Jill Mcfarland is a 42 y.o. female brought in by EMS complaining of a bilateral lower abdominal pain that radiates down to his legs worsening over the course of the week associated with urinary frequency and nausea. Patient also developed a substernal, nonradiating sharp chest pain severe described as 10 out of 10 when she was at rest this morning eating breakfast. The patient had not had any exertion before this happened. Pain is associated with shortness of breath, [place] for approximately 10 minutes and resolved spontaneously. Patient denies diaphoresis, emesis, fever, dysuria, abnormal vaginal discharge, flank pain, cough out of the norm for her usual (she is a smoker) patient has been taking BC and Motrin for the abdominal pain with little relief. Patient also has abscess on my lower abdomen.   No PCP  Patient is active daily smoker  Past Medical History  Diagnosis Date  . Abscess   . Bartholin cyst   . Depression     History - no current prob per pt.  . Anxiety     History - no current prob per pt.  . Bronchitis     Past Surgical History  Procedure Laterality Date  . Tubal ligation    . Leep    . Incison and drainage      Bartholin cyst  . Wisdom tooth extraction    . Svd      x 3  . Bartholin cyst marsupialization  12/03/2011    Procedure: BARTHOLIN CYST MARSUPIALIZATION;  Surgeon: Catalina Antigua, MD;  Location: [facility];  Service: Gynecology;  Laterality: Left;  Bartholin Abcess    Family History  Problem Relation Age of Onset  . Cancer Mother   . Asthma Son     History  Substance Use Topics  . Smoking status: Current Every Day Smoker -- 0.50 packs/day for 24 years    Types:  Cigarettes  . Smokeless tobacco: Never Used  . Alcohol Use: No    OB History   Grav Para Term Preterm Abortions TAB SAB Ect Mult Living   4 3 3  1 1    2       Review of Systems  Constitutional: Negative for fever.  Respiratory: Negative for shortness of breath.   Cardiovascular: Positive for chest pain.  Gastrointestinal: Positive for nausea and abdominal pain. Negative for vomiting and diarrhea.  Genitourinary: Positive for frequency.  Skin: Positive for rash.  All other systems reviewed and are negative.    Allergies  Review of patient's allergies indicates no known allergies.  Home Medications   Current Outpatient Rx  Name  Route  Sig  Dispense  Refill  . diphenhydrAMINE (BENADRYL) 25 MG tablet   Oral   Take 25 mg by mouth every 6 (six) hours as needed. For allergies.         . fluconazole (DIFLUCAN) 150 MG tablet   Oral   Take 1 tablet (150 mg total) by mouth once.   1 tablet   0   . metroNIDAZOLE (FLAGYL) 500 MG tablet   Oral   Take 500 mg by mouth 2 (two) times daily. Pt to take for 7 days. Finished on Friday 09-23-12         .  metroNIDAZOLE (FLAGYL) 500 MG tablet   Oral   Take 1 tablet (500 mg total) by mouth 3 (three) times daily.   14 tablet   0   . oxyCODONE-acetaminophen (PERCOCET) 5-325 MG per tablet   Oral   Take 1 tablet by mouth every 4 (four) hours as needed for pain.   10 tablet   0     BP 112/76  Pulse 105  Temp(Src) 98 F (36.7 C) (Oral)  Resp 18  SpO2 94%  LMP 12/15/2012  Physical Exam  Nursing note and vitals reviewed. Constitutional: She is oriented to person, place, and time. She appears well-developed and well-nourished. No distress.  HENT:  Head: Normocephalic.  Mouth/Throat: Oropharynx is clear and moist.  Eyes: Conjunctivae and EOM are normal. Pupils are equal, round, and reactive to light.  Neck: Normal range of motion. No JVD present. No tracheal deviation present.  Cardiovascular: Normal rate.   Pulmonary/Chest:  Effort normal. No stridor. No respiratory distress. She has wheezes. She has no rales. She exhibits no tenderness.  Expiratory wheezing diffusely, no rales or rhonchi  Abdominal: Soft. She exhibits no distension and no mass. There is tenderness. There is no rebound and no guarding.  Mild suprapubic tenderness, no guarding or rebound.  Genitourinary:  Pelvic exam chaperoned by nurse to stay.  No lesions, there is a well-healing incision to the Bartholin's gland on the left side. Patient is diffusely tender  she has a thick, white, non-foul-smelling discharge. Patient exhibits cervical motion and bilateral adnexal tenderness.   Musculoskeletal: Normal range of motion. She exhibits no edema and no tenderness.  No calf asymmetry, superficial collaterals, palpable cords, edema, Homans sign negative bilaterally.    Neurological: She is alert and oriented to person, place, and time.  Skin:  1 cm fluctuant abscess to left lower quadrant  Psychiatric: She has a normal mood and affect.    ED Course  Procedures (including critical care time)  INCISION AND DRAINAGE Performed by: Wynetta Emery Consent: Verbal consent obtained. Risks and benefits: risks, benefits and alternatives were discussed Type: abscess  Body area: LLQ  Anesthesia: local infiltration  Incision was made with a scalpel.  Local anesthetic: lidocaine 2% with epinephrine  Anesthetic total: 3 ml  Complexity: complex Blunt dissection to break up loculations  Drainage: purulent  Drainage amount: 2 ML   Packing material: None  Patient tolerance: Patient tolerated the procedure well with no immediate complications.    Labs Reviewed  WET PREP, GENITAL - Abnormal; Notable for the following:    Clue Cells Wet Prep HPF POC FEW (*)    WBC, Wet Prep HPF POC FEW (*)    All other components within normal limits  COMPREHENSIVE METABOLIC PANEL - Abnormal; Notable for the following:    BUN 4 (*)    Creatinine, Ser 0.48  (*)    Total Bilirubin 0.2 (*)    All other components within normal limits  CBC WITH DIFFERENTIAL - Abnormal; Notable for the following:    WBC 11.4 (*)    RBC 5.15 (*)    Hemoglobin 15.9 (*)    HCT 46.4 (*)    All other components within normal limits  URINALYSIS, ROUTINE W REFLEX MICROSCOPIC - Abnormal; Notable for the following:    Leukocytes, UA TRACE (*)    All other components within normal limits  GC/CHLAMYDIA PROBE AMP  LIPASE, BLOOD  URINE MICROSCOPIC-ADD ON   Dg Chest 2 View  12/24/2012  *RADIOLOGY REPORT*  Clinical Data: Mid chest  pain  CHEST - 2 VIEW  Comparison: 09/28/2012  Findings: Heart size is normal.  The lungs are clear.  No pleural effusion.  No acute osseous finding.  IMPRESSION: No acute cardiopulmonary process.   Original Report Authenticated By: Christiana Pellant, M.D.      Date: 12/24/2012  Rate: 103  Rhythm:  Sinus tachycardia  QRS Axis: normal  Intervals: normal  ST/T Wave abnormalities: normal  Conduction Disutrbances:none  Narrative Interpretation:   Old EKG Reviewed: None available   1. Chest pain   2. Lower abdominal pain, unspecified laterality   3. Abscess   4. Tobacco use disorder       MDM   Jill Mcfarland is a 42 y.o. female with abdominal pain worsening over the course of the week, chest pain for 10 minutes resolved at this point, abscess.  EKG is nonischemic, patient is a mild leukocytosis of 11.4. Chest x-ray shows no infiltrate. Lipase is negative and i-STAT troponin is also negative (there is an issue with the results bridge and over into Epic I reviewed the results personally).  Incision and drainage of abscess. Patient is adamant that she would like to leave. I have recommended a delta troponin but she refuses this. Extensive return precautions are given, .   Filed Vitals:   12/24/12 1049  BP: 112/76  Pulse: 105  Temp: 98 F (36.7 C)  TempSrc: Oral  Resp: 18  SpO2: 94%     VSS and patient is appropriate for, and  amenable to, discharge at this time. Pt verbalized understanding and agrees with care plan. Outpatient follow-up and return precautions given.    New Prescriptions   OXYCODONE-ACETAMINOPHEN (PERCOCET/ROXICET) 5-325 MG PER TABLET    1 to 2 tabs PO q6hrs  PRN for pain   PROMETHAZINE (PHENERGAN) 25 MG TABLET    Take 1 tablet (25 mg total) by mouth every 6 (six) hours as needed for nausea.          Wynetta Emery, PA-C 12/24/12 1359

## 2012-12-24 NOTE — ED Notes (Signed)
Pelvic exam completed.

## 2012-12-24 NOTE — ED Notes (Signed)
PER EMS Patient  Had abdominal pain that started about 1 week ago. Progressively getting worse in suprapubic area. Also c/o chest pain after exertion and after coughing. Had been outside doing yard work. Has a boil in lower abdomen, has hx of cysts. Patient is smoker and has cough that is normal for her.Pain in abd. sometimes radiates down to her leg. BP 130/70, HR 120

## 2012-12-25 LAB — GC/CHLAMYDIA PROBE AMP
CT Probe RNA: NEGATIVE
GC Probe RNA: NEGATIVE

## 2013-02-10 ENCOUNTER — Emergency Department (HOSPITAL_COMMUNITY)
Admission: EM | Admit: 2013-02-10 | Discharge: 2013-02-10 | Disposition: A | Discharge status: Home / Self Care | Payer: Self-pay | Attending: Emergency Medicine | Admitting: Emergency Medicine

## 2013-02-10 DIAGNOSIS — L0291 Cutaneous abscess, unspecified: Secondary | ICD-10-CM

## 2013-02-10 DIAGNOSIS — Z79899 Other long term (current) drug therapy: Secondary | ICD-10-CM | POA: Insufficient documentation

## 2013-02-10 DIAGNOSIS — K047 Periapical abscess without sinus: Secondary | ICD-10-CM

## 2013-02-10 DIAGNOSIS — Z8709 Personal history of other diseases of the respiratory system: Secondary | ICD-10-CM | POA: Insufficient documentation

## 2013-02-10 DIAGNOSIS — K089 Disorder of teeth and supporting structures, unspecified: Secondary | ICD-10-CM | POA: Insufficient documentation

## 2013-02-10 DIAGNOSIS — K029 Dental caries, unspecified: Secondary | ICD-10-CM | POA: Insufficient documentation

## 2013-02-10 DIAGNOSIS — Z8742 Personal history of other diseases of the female genital tract: Secondary | ICD-10-CM | POA: Insufficient documentation

## 2013-02-10 DIAGNOSIS — K0889 Other specified disorders of teeth and supporting structures: Secondary | ICD-10-CM

## 2013-02-10 DIAGNOSIS — F17200 Nicotine dependence, unspecified, uncomplicated: Secondary | ICD-10-CM | POA: Insufficient documentation

## 2013-02-10 DIAGNOSIS — L02419 Cutaneous abscess of limb, unspecified: Secondary | ICD-10-CM | POA: Insufficient documentation

## 2013-02-10 DIAGNOSIS — Z8659 Personal history of other mental and behavioral disorders: Secondary | ICD-10-CM | POA: Insufficient documentation

## 2013-02-10 MED ORDER — PENICILLIN V POTASSIUM 500 MG PO TABS: 500.0000 mg | ORAL_TABLET | Freq: Four times a day (QID) | ORAL | Status: DC

## 2013-02-10 MED ORDER — OXYCODONE-ACETAMINOPHEN 5-325 MG PO TABS
1.0000 | ORAL_TABLET | Freq: Once | ORAL | Status: AC
  Administered 2013-02-10: 1 via ORAL
  Filled 2013-02-10: qty 1

## 2013-02-10 MED ORDER — HYDROCODONE-ACETAMINOPHEN 5-325 MG PO TABS: 2.0000 | ORAL_TABLET | Freq: Once | ORAL | Status: DC

## 2013-02-10 MED ORDER — OXYCODONE-ACETAMINOPHEN 5-325 MG PO TABS: 1.0000 | ORAL_TABLET | ORAL | Status: DC | PRN

## 2013-02-10 NOTE — ED Provider Notes (Signed)
Medical screening examination/treatment/procedure(s) were performed by non-physician practitioner and as supervising physician I was immediately available for consultation/collaboration.   Rolan Bucco, MD 02/10/13 [phone]

## 2013-02-10 NOTE — ED Notes (Signed)
Pt c/o dental pain to front lower teeth. Pt has multiple dental caries and poor dentition. Pt states she does not have a dentist and would like a referral. Pt has no acute distress.  Pt also has an abscess to her mid groin area and L thigh. Pt ambulatory to exam room with steady gait. Pt arrives with companion.

## 2013-02-10 NOTE — ED Provider Notes (Signed)
History  This chart was scribed for Dierdre Forth- PA by Manuela Schwartz, ED scribe. This patient was seen in room WTR6/WTR6 and the patient's care was started at 1835.  CSN: [id number] Arrival date & time 02/10/13  1801  First MD Initiated Contact with Patient 02/10/13 1835     Chief Complaint  Patient presents with  . Dental Pain  . Abscess   Patient is a 42 y.o. female presenting with tooth pain. The history is provided by the patient. No language interpreter was used.  Dental Pain Location:  Lower Quality:  Constant, localized and throbbing Severity:  Moderate Onset quality:  Gradual Duration:  7 days Timing:  Constant Progression:  Worsening Chronicity:  New Context: dental caries and dental fracture   Relieved by:  Nothing Worsened by:  Touching Ineffective treatments:  None tried Associated symptoms: no fever, no headaches and no oral lesions    HPI Comments: Jill Mcfarland is a 42 y.o. female who presents to the Emergency Department complaining of constant gradually worsening lower dental pain over past month and worsened this week after x2 lower teeth broke. She states chills, HA and nausea but denies fever, difficulty swallowing, ear pain. She has no dentist.   She also c/o small abscess to her posterior left thigh worsened over past several days. She denies d/c or exudate. She has been expressing puss from the site for several days.  Pt denies fever, neck pain, chest pain, SOB, N/V/D, weakness, dizziness, syncope.    Past Medical History  Diagnosis Date  . Abscess   . Bartholin cyst   . Depression     History - no current prob per pt.  . Anxiety     History - no current prob per pt.  . Bronchitis    Past Surgical History  Procedure Laterality Date  . Tubal ligation    . Leep    . Incison and drainage      Bartholin cyst  . Wisdom tooth extraction    . Svd      x 3  . Bartholin cyst marsupialization  12/03/2011    Procedure: BARTHOLIN CYST  MARSUPIALIZATION;  Surgeon: Catalina Antigua, MD;  Location: [facility];  Service: Gynecology;  Laterality: Left;  Bartholin Abcess   Family History  Problem Relation Age of Onset  . Cancer Mother   . Asthma Son    History  Substance Use Topics  . Smoking status: Current Every Day Smoker -- 0.50 packs/day for 24 years    Types: Cigarettes  . Smokeless tobacco: Never Used  . Alcohol Use: No   OB History   Grav Para Term Preterm Abortions TAB SAB Ect Mult Living   4 3 3  1 1    2      Review of Systems  Constitutional: Negative for fever, diaphoresis, appetite change, fatigue and unexpected weight change.  HENT: Positive for dental problem (dental carries and broken/painful teeth). Negative for mouth sores and neck stiffness.   Eyes: Negative for visual disturbance.  Respiratory: Negative for cough, chest tightness, shortness of breath and wheezing.   Cardiovascular: Negative for chest pain.  Gastrointestinal: Negative for nausea, vomiting, abdominal pain, diarrhea and constipation.  Endocrine: Negative for polydipsia, polyphagia and polyuria.  Genitourinary: Negative for dysuria, urgency, frequency and hematuria.  Musculoskeletal: Negative for back pain.  Skin: Negative for rash.       Small abscess to posterior aspect of her left thigh  Allergic/Immunologic: Negative for immunocompromised state.  Neurological: Negative for  syncope, light-headedness and headaches.  Hematological: Does not bruise/bleed easily.  Psychiatric/Behavioral: Negative for sleep disturbance. The patient is not nervous/anxious.   All other systems reviewed and are negative.   A complete 10 system review of systems was obtained and all systems are negative except as noted in the HPI and PMH.   Allergies  Vicodin  Home Medications   Current Outpatient Rx  Name  Route  Sig  Dispense  Refill  . Aspirin-Salicylamide-Caffeine (BC HEADACHE POWDER PO)   Oral   Take 1 packet by mouth 3 (three) times daily as  needed (for pain).         . calcium carbonate (OS-CAL) 600 MG TABS   Oral   Take 1,200 mg by mouth daily.         . diphenhydrAMINE (BENADRYL) 25 MG tablet   Oral   Take 25 mg by mouth every 6 (six) hours as needed. For allergies.         Marland Kitchen ibuprofen (ADVIL,MOTRIN) 200 MG tablet   Oral   Take 400 mg by mouth every 8 (eight) hours as needed for pain.         . Multiple Vitamin (MULTIVITAMIN WITH MINERALS) TABS   Oral   Take 1 tablet by mouth daily.          Triage Vitals: BP 124/82  Pulse 111  Temp(Src) 99 F (37.2 C) (Oral)  Resp 17  SpO2 95% Physical Exam  Nursing note and vitals reviewed. Constitutional: She appears well-developed and well-nourished.  HENT:  Head: Normocephalic and atraumatic.  Right Ear: Tympanic membrane, external ear and ear canal normal.  Left Ear: Tympanic membrane, external ear and ear canal normal.  Nose: Nose normal. Right sinus exhibits no maxillary sinus tenderness and no frontal sinus tenderness. Left sinus exhibits no maxillary sinus tenderness and no frontal sinus tenderness.  Mouth/Throat: Uvula is midline, oropharynx is clear and moist and mucous membranes are normal. No oral lesions. Abnormal dentition. Dental caries present. No dental abscesses, edematous or lacerations. No oropharyngeal exudate, posterior oropharyngeal edema, posterior oropharyngeal erythema or tonsillar abscesses.    TMs non erythematous, and not buldging  Oropharynx and gumlines: No areas of fluctuance, no induration on floor of the mouth Submandibular and submental lymphadenopathy, no cervical adenopathy    Eyes: Conjunctivae are normal. Pupils are equal, round, and reactive to light. Right eye exhibits no discharge. Left eye exhibits no discharge.  Neck: Normal range of motion. Neck supple.  Cardiovascular: Normal rate, regular rhythm and normal heart sounds.   Pulmonary/Chest: Effort normal and breath sounds normal. No respiratory distress. She has no  wheezes.  Abdominal: Soft. Bowel sounds are normal. She exhibits no distension. There is no tenderness.  Lymphadenopathy:    She has no cervical adenopathy.  Neurological: She is alert.  Skin: Skin is warm and dry. There is erythema.  2x2 area of induration, minimal fluctuance, posterior left thigh  Psychiatric: She has a normal mood and affect.    ED Course  INCISION AND DRAINAGE Date/Time: 02/10/2013 7:58 PM Performed by: Dierdre Forth Authorized by: Dierdre Forth Consent: Verbal consent obtained. Risks and benefits: risks, benefits and alternatives were discussed Consent given by: patient Patient understanding: patient states understanding of the procedure being performed Patient consent: the patient's understanding of the procedure matches consent given Procedure consent: procedure consent matches procedure scheduled Relevant documents: relevant documents present and verified Site marked: the operative site was marked Required items: required blood products, implants, devices, and special equipment  available Patient identity confirmed: verbally with patient and arm band Time out: Immediately prior to procedure a "time out" was called to verify the correct patient, procedure, equipment, support staff and site/side marked as required. Type: abscess Body area: lower extremity Location details: left leg Anesthesia: local infiltration Local anesthetic: lidocaine 1% without epinephrine Anesthetic total: 3 ml Patient sedated: no Scalpel size: 11 Incision type: single straight Complexity: simple Drainage: purulent Drainage amount: moderate Wound treatment: wound left open Packing material: none Patient tolerance: Patient tolerated the procedure well with no immediate complications.   (including critical care time) DIAGNOSTIC STUDIES: Oxygen Saturation is 95% on room air, adequate by my interpretation.    COORDINATION OF CARE: At 725 PM Discussed treatment plan  with patient which includes pain medicine. Patient agrees.   Labs Reviewed - No data to display No results found. 1. Pain, dental   2. Dental infection   3. Abscess     MDM  Jill Mcfarland presents for dental pain and abscess.  Patient with toothache.  No gross abscess.  Exam unconcerning for Ludwig's angina or spread of infection.  Will treat with penicillin and pain medicine.  Urged patient to follow-up with dentist.  Patient with skin abscess amenable to incision and drainage.  Abscess was not large enough to warrant packing or drain,  wound recheck in 2 days. Encouraged home warm soaks and flushing.  Mild signs of cellulitis is surrounding skin.  Will d/c to home.  No antibiotic therapy is indicated.  I have also discussed reasons to return immediately to the ER.  Patient expresses understanding and agrees with plan.  I personally performed the services described in this documentation, which was scribed in my presence. The recorded information has been reviewed and is accurate.    Dahlia Client Hanson Medeiros, PA-C 02/10/13 2000

## 2013-04-04 ENCOUNTER — Encounter (HOSPITAL_COMMUNITY): Payer: Self-pay | Admitting: Emergency Medicine

## 2013-04-04 ENCOUNTER — Emergency Department (HOSPITAL_COMMUNITY): Payer: Self-pay

## 2013-04-04 ENCOUNTER — Emergency Department (HOSPITAL_COMMUNITY)
Admission: EM | Admit: 2013-04-04 | Discharge: 2013-04-04 | Disposition: A | Discharge status: Home / Self Care | Payer: Self-pay | Attending: Emergency Medicine | Admitting: Emergency Medicine

## 2013-04-04 DIAGNOSIS — Z8659 Personal history of other mental and behavioral disorders: Secondary | ICD-10-CM | POA: Insufficient documentation

## 2013-04-04 DIAGNOSIS — S93409A Sprain of unspecified ligament of unspecified ankle, initial encounter: Secondary | ICD-10-CM | POA: Insufficient documentation

## 2013-04-04 DIAGNOSIS — F17200 Nicotine dependence, unspecified, uncomplicated: Secondary | ICD-10-CM | POA: Insufficient documentation

## 2013-04-04 DIAGNOSIS — W108XXA Fall (on) (from) other stairs and steps, initial encounter: Secondary | ICD-10-CM | POA: Insufficient documentation

## 2013-04-04 DIAGNOSIS — Z872 Personal history of diseases of the skin and subcutaneous tissue: Secondary | ICD-10-CM | POA: Insufficient documentation

## 2013-04-04 DIAGNOSIS — Y9301 Activity, walking, marching and hiking: Secondary | ICD-10-CM | POA: Insufficient documentation

## 2013-04-04 DIAGNOSIS — S93401A Sprain of unspecified ligament of right ankle, initial encounter: Secondary | ICD-10-CM

## 2013-04-04 DIAGNOSIS — Z8742 Personal history of other diseases of the female genital tract: Secondary | ICD-10-CM | POA: Insufficient documentation

## 2013-04-04 DIAGNOSIS — Z8709 Personal history of other diseases of the respiratory system: Secondary | ICD-10-CM | POA: Insufficient documentation

## 2013-04-04 DIAGNOSIS — Y9289 Other specified places as the place of occurrence of the external cause: Secondary | ICD-10-CM | POA: Insufficient documentation

## 2013-04-04 MED ORDER — OXYCODONE-ACETAMINOPHEN 5-325 MG PO TABS
2.0000 | ORAL_TABLET | Freq: Once | ORAL | Status: AC
  Administered 2013-04-04: 2 via ORAL
  Filled 2013-04-04: qty 2

## 2013-04-04 MED ORDER — OXYCODONE-ACETAMINOPHEN 5-325 MG PO TABS: 2.0000 | ORAL_TABLET | Freq: Four times a day (QID) | ORAL | Status: DC | PRN

## 2013-04-04 NOTE — ED Notes (Signed)
Pt states that she fell last Friday, since have been having right ankle/foot pain. Pain 10/10 . Take Tyelnol and BC with no relief.

## 2013-04-04 NOTE — ED Provider Notes (Signed)
CSN: [id number]     Arrival date & time 04/04/13  1556 History  This chart was scribed for non-physician practitioner Roxy Horseman, PA-C, working with Layla Maw Ward, DO, by Yevette Edwards, ED Scribe. This patient was seen in room WTR6/WTR6 and the patient's care was started at 5:33 PM.   First MD Initiated Contact with Patient 04/04/13 1640     Chief Complaint  Patient presents with  . Ankle Pain   The history is provided by the patient. No language interpreter was used.   HPI Comments: Jill Mcfarland is a 42 y.o. female who presents to the Emergency Department complaining of pain to her right ankle which began four days ago after she injured her foot. Four days ago, the pt fell walking down her steps and twisted her foot. The pt rates the pain as 10/10. She has experienced swelling to her right ankle in addition to the pain. She has attempted to alleviate the pain with tylenol and BC, with but no resolution. The pt is able to ambulate. She denies an allergy to percocet.    Past Medical History  Diagnosis Date  . Abscess   . Bartholin cyst   . Depression     History - no current prob per pt.  . Anxiety     History - no current prob per pt.  . Bronchitis    Past Surgical History  Procedure Laterality Date  . Tubal ligation    . Leep    . Incison and drainage      Bartholin cyst  . Wisdom tooth extraction    . Svd      x 3  . Bartholin cyst marsupialization  12/03/2011    Procedure: BARTHOLIN CYST MARSUPIALIZATION;  Surgeon: Catalina Antigua, MD;  Location: [facility];  Service: Gynecology;  Laterality: Left;  Bartholin Abcess   Family History  Problem Relation Age of Onset  . Cancer Mother   . Asthma Son    History  Substance Use Topics  . Smoking status: Current Every Day Smoker -- 0.50 packs/day for 24 years    Types: Cigarettes  . Smokeless tobacco: Never Used  . Alcohol Use: No   OB History   Grav Para Term Preterm Abortions TAB SAB Ect Mult Living   4 3 3  1 1    2       Review of Systems  A complete 10 system review of systems was obtained, and all systems were negative except where indicated in the HPI and PE.   Allergies  Tramadol and Vicodin  Home Medications   Current Outpatient Rx  Name  Route  Sig  Dispense  Refill  . acetaminophen (TYLENOL) 500 MG tablet   Oral   Take 1,000 mg by mouth every 6 (six) hours as needed for pain.         . Aspirin-Salicylamide-Caffeine (BC HEADACHE POWDER PO)   Oral   Take 1 packet by mouth 3 (three) times daily as needed (for pain).         Marland Kitchen diphenhydrAMINE (BENADRYL) 25 MG tablet   Oral   Take 25 mg by mouth every 6 (six) hours as needed. For allergies.          Triage Vitals:  LMP 03/04/2013  Physical Exam  Nursing note and vitals reviewed. Constitutional: She is oriented to person, place, and time. She appears well-developed and well-nourished. No distress.  HENT:  Head: Normocephalic and atraumatic.  Eyes: EOM are normal.  Neck:  Neck supple. No tracheal deviation present.  Cardiovascular: Normal rate.   Intact distal pulses. Brisk cap refill.   Pulmonary/Chest: Effort normal. No respiratory distress.  Musculoskeletal: Normal range of motion.  Right ankle tender to palpation over ATFL. No bony tenderness, no gross abnormality or deformity. Mild swelling over the lateral aspect. Range of motion and strength reduced secondary to pain.   Neurological: She is alert and oriented to person, place, and time.  Sensations intact.   Skin: Skin is warm and dry.  Psychiatric: She has a normal mood and affect. Her behavior is normal.    ED Course   DIAGNOSTIC STUDIES:    COORDINATION OF CARE:  5:34 PM- Discussed treatment plan with patient which includes pain medication, a splint, and crutches. Also advised pt to follow-up with an orthopedist. The patient agreed to the plan.   Procedures (including critical care time)  Labs Reviewed - No data to display Dg Ankle Complete  Right  04/04/2013   *RADIOLOGY REPORT*  Clinical Data: Fall Friday with foot and ankle pain.  RIGHT ANKLE - COMPLETE 3+ VIEW  Comparison: 03/31/2013 foot films.  Findings: Mild lateral malleolar soft tissue swelling. No acute fracture or dislocation.  Apparent osseous irregularity at the base of the fifth metatarsal on the AP and mortise views.  Not definitely confirmed on the lateral view and not present on the prior exam.  IMPRESSION: Lateral soft tissue swelling, without definite acute osseous abnormality.  Equivocal osseous irregularity at the base of the fifth metatarsal, only on some views.  Correlate with point tenderness.   Original Report Authenticated By: Jeronimo Greaves, M.D.   Dg Foot Complete Right  04/04/2013   *RADIOLOGY REPORT*  Clinical Data: Fall.  Right foot pain.  RIGHT FOOT COMPLETE - 3+ VIEW  Comparison: None.  Findings: Anatomic alignment of the right foot.  There is no fracture identified.  Fifth metatarsal base appears within normal limits.  No radiopaque foreign body.  Soft tissues appear normal.  IMPRESSION: Negative.   Original Report Authenticated By: Andreas Newport, M.D.   1. Ankle sprain, right, initial encounter     MDM  Patient with right ankle injury. Will give ankle splint, crutches, pain medicine, and orthopedic referral. Patient understands and agrees to plan. She is stable discharge.  I personally performed the services described in this documentation, which was scribed in my presence. The recorded information has been reviewed and is accurate.     Roxy Horseman, PA-C 04/04/13 1746

## 2013-04-05 NOTE — ED Provider Notes (Signed)
Medical screening examination/treatment/procedure(s) were performed by non-physician practitioner and as supervising physician I was immediately available for consultation/collaboration.  Layla Maw Matan Steen, DO 04/05/13 0008

## 2013-05-21 ENCOUNTER — Emergency Department (HOSPITAL_COMMUNITY): Payer: Medicaid Other

## 2013-05-21 ENCOUNTER — Encounter (HOSPITAL_COMMUNITY): Payer: Self-pay | Admitting: Emergency Medicine

## 2013-05-21 ENCOUNTER — Emergency Department (HOSPITAL_COMMUNITY)
Admission: EM | Admit: 2013-05-21 | Discharge: 2013-05-21 | Disposition: A | Discharge status: Home / Self Care | Payer: Medicaid Other | Attending: Emergency Medicine | Admitting: Emergency Medicine

## 2013-05-21 DIAGNOSIS — Z8659 Personal history of other mental and behavioral disorders: Secondary | ICD-10-CM | POA: Insufficient documentation

## 2013-05-21 DIAGNOSIS — R000 Tachycardia, unspecified: Secondary | ICD-10-CM | POA: Insufficient documentation

## 2013-05-21 DIAGNOSIS — M549 Dorsalgia, unspecified: Secondary | ICD-10-CM | POA: Insufficient documentation

## 2013-05-21 DIAGNOSIS — Z8742 Personal history of other diseases of the female genital tract: Secondary | ICD-10-CM | POA: Insufficient documentation

## 2013-05-21 DIAGNOSIS — Z872 Personal history of diseases of the skin and subcutaneous tissue: Secondary | ICD-10-CM | POA: Insufficient documentation

## 2013-05-21 DIAGNOSIS — J40 Bronchitis, not specified as acute or chronic: Secondary | ICD-10-CM | POA: Insufficient documentation

## 2013-05-21 DIAGNOSIS — Z79899 Other long term (current) drug therapy: Secondary | ICD-10-CM | POA: Insufficient documentation

## 2013-05-21 DIAGNOSIS — R079 Chest pain, unspecified: Secondary | ICD-10-CM | POA: Insufficient documentation

## 2013-05-21 DIAGNOSIS — F17200 Nicotine dependence, unspecified, uncomplicated: Secondary | ICD-10-CM | POA: Insufficient documentation

## 2013-05-21 LAB — CBC WITH DIFFERENTIAL/PLATELET
Basophils Absolute: 0 10*3/uL (ref 0.0–0.1)
Basophils Relative: 0 % (ref 0–1)
Eosinophils Absolute: 0.2 10*3/uL (ref 0.0–0.7)
Eosinophils Relative: 2 % (ref 0–5)
HCT: 47.5 % — ABNORMAL HIGH (ref 36.0–46.0)
Hemoglobin: 16.4 g/dL — ABNORMAL HIGH (ref 12.0–15.0)
Lymphocytes Relative: 31 % (ref 12–46)
Lymphs Abs: 3.1 10*3/uL (ref 0.7–4.0)
MCH: 31.1 pg (ref 26.0–34.0)
MCHC: 34.5 g/dL (ref 30.0–36.0)
MCV: 90.1 fL (ref 78.0–100.0)
Monocytes Absolute: 1 10*3/uL (ref 0.1–1.0)
Monocytes Relative: 10 % (ref 3–12)
Neutro Abs: 5.6 10*3/uL (ref 1.7–7.7)
Neutrophils Relative %: 56 % (ref 43–77)
Platelets: 288 10*3/uL (ref 150–400)
RBC: 5.27 MIL/uL — ABNORMAL HIGH (ref 3.87–5.11)
RDW: 13.4 % (ref 11.5–15.5)
WBC: 9.9 10*3/uL (ref 4.0–10.5)

## 2013-05-21 LAB — POCT I-STAT, CHEM 8
BUN: 8 mg/dL (ref 6–23)
Calcium, Ion: 1.19 mmol/L (ref 1.12–1.23)
Chloride: 104 mEq/L (ref 96–112)
Creatinine, Ser: 0.8 mg/dL (ref 0.50–1.10)
Glucose, Bld: 93 mg/dL (ref 70–99)
HCT: 54 % — ABNORMAL HIGH (ref 36.0–46.0)
Hemoglobin: 18.4 g/dL — ABNORMAL HIGH (ref 12.0–15.0)
Potassium: 3.7 mEq/L (ref 3.5–5.1)
Sodium: 143 mEq/L (ref 135–145)
TCO2: 24 mmol/L (ref 0–100)

## 2013-05-21 LAB — POCT I-STAT TROPONIN I: Troponin i, poc: 0 ng/mL (ref 0.00–0.08)

## 2013-05-21 LAB — D-DIMER, QUANTITATIVE: D-Dimer, Quant: 0.33 ug/mL-FEU (ref 0.00–0.48)

## 2013-05-21 MED ORDER — IPRATROPIUM BROMIDE 0.02 % IN SOLN: 0.5000 mg | Freq: Once | RESPIRATORY_TRACT | Status: DC

## 2013-05-21 MED ORDER — ALBUTEROL SULFATE HFA 108 (90 BASE) MCG/ACT IN AERS
2.0000 | INHALATION_SPRAY | Freq: Once | RESPIRATORY_TRACT | Status: AC
  Administered 2013-05-21: 2 via RESPIRATORY_TRACT
  Filled 2013-05-21: qty 6.7

## 2013-05-21 MED ORDER — ALBUTEROL SULFATE (5 MG/ML) 0.5% IN NEBU
5.0000 mg | INHALATION_SOLUTION | Freq: Once | RESPIRATORY_TRACT | Status: AC
  Administered 2013-05-21: 5 mg via RESPIRATORY_TRACT
  Filled 2013-05-21: qty 1

## 2013-05-21 MED ORDER — PREDNISONE 10 MG PO TABS: ORAL_TABLET | ORAL | Status: DC

## 2013-05-21 MED ORDER — ALBUTEROL SULFATE (5 MG/ML) 0.5% IN NEBU: 5.0000 mg | INHALATION_SOLUTION | Freq: Once | RESPIRATORY_TRACT | Status: DC

## 2013-05-21 MED ORDER — ALBUTEROL (5 MG/ML) CONTINUOUS INHALATION SOLN
10.0000 mg/h | INHALATION_SOLUTION | Freq: Once | RESPIRATORY_TRACT | Status: AC
  Administered 2013-05-21: 10 mg/h via RESPIRATORY_TRACT
  Filled 2013-05-21: qty 20

## 2013-05-21 MED ORDER — IPRATROPIUM BROMIDE 0.02 % IN SOLN
0.5000 mg | Freq: Once | RESPIRATORY_TRACT | Status: AC
  Administered 2013-05-21: 0.5 mg via RESPIRATORY_TRACT
  Filled 2013-05-21: qty 2.5

## 2013-05-21 MED ORDER — AZITHROMYCIN 250 MG PO TABS: ORAL_TABLET | ORAL | Status: DC

## 2013-05-21 MED ORDER — AZITHROMYCIN 250 MG PO TABS
500.0000 mg | ORAL_TABLET | Freq: Once | ORAL | Status: AC
  Administered 2013-05-21: 500 mg via ORAL
  Filled 2013-05-21: qty 2

## 2013-05-21 MED ORDER — PREDNISONE 20 MG PO TABS
60.0000 mg | ORAL_TABLET | Freq: Once | ORAL | Status: AC
  Administered 2013-05-21: 60 mg via ORAL
  Filled 2013-05-21: qty 3

## 2013-05-21 MED ORDER — ALBUTEROL SULFATE (5 MG/ML) 0.5% IN NEBU: 2.5000 mg | INHALATION_SOLUTION | Freq: Once | RESPIRATORY_TRACT | Status: DC

## 2013-05-21 NOTE — ED Provider Notes (Signed)
CSN: [id number]     Arrival date & time 05/21/13  1408 History  This chart was scribed for non-physician practitioner, Jaynie Crumble, PA-C, working with Laray Anger, DO, by Karle Plumber, ED Scribe.  This patient was seen in room WTR8/WTR8 and the patient's care was started at 3:43 PM.  Chief Complaint  Patient presents with  . Shortness of Breath  . Cough     cough x 3 days   The history is provided by the patient. No language interpreter was used.   HPI Comments:  Jill Mcfarland is a 42 y.o. female who presents to the Emergency Department complaining of moderate, intermittent worsening non-productive cough onset 3 days. Pt states she has associated chest and back pain with inhalation. Pt states she took NyQuil at home with mild relief. Pt denies using an MDI at home. She states she has not eaten or drank anything today. She has received two breathing treatments here in the ED with mild relief. Pt states she was here 'a few months ago' with CP and was referred to [facility] but has never followed up. She denies any recent travel. She states she is a current smoker of one-half PPD.  Past Medical History  Diagnosis Date  . Abscess   . Bartholin cyst   . Depression     History - no current prob per pt.  . Anxiety     History - no current prob per pt.  . Bronchitis    Past Surgical History  Procedure Laterality Date  . Tubal ligation    . Leep    . Incison and drainage      Bartholin cyst  . Wisdom tooth extraction    . Svd      x 3  . Bartholin cyst marsupialization  12/03/2011    Procedure: BARTHOLIN CYST MARSUPIALIZATION;  Surgeon: Catalina Antigua, MD;  Location: [facility];  Service: Gynecology;  Laterality: Left;  Bartholin Abcess   Family History  Problem Relation Age of Onset  . Cancer Mother   . Asthma Son    History  Substance Use Topics  . Smoking status: Current Every Day Smoker -- 0.50 packs/day for 24 years    Types: Cigarettes  . Smokeless  tobacco: Never Used  . Alcohol Use: No   OB History   Grav Para Term Preterm Abortions TAB SAB Ect Mult Living   4 3 3  1 1    2      Review of Systems  Respiratory: Positive for cough, chest tightness and shortness of breath.   All other systems reviewed and are negative.   Allergies  Tramadol and Vicodin  Home Medications   Current Outpatient Rx  Name  Route  Sig  Dispense  Refill  . acetaminophen (TYLENOL) 500 MG tablet   Oral   Take 1,000 mg by mouth every 6 (six) hours as needed for pain (headache).          . Aspirin-Salicylamide-Caffeine (BC HEADACHE POWDER PO)   Oral   Take 1 packet by mouth 3 (three) times daily as needed (for pain).         . Pseudoeph-Doxylamine-DM-APAP (NYQUIL PO)   Oral   Take 30 mLs by mouth daily as needed (cold).          Triage Vitals: BP 121/80  Pulse 125  Temp(Src) 98.7 F (37.1 C) (Oral)  Resp 22  SpO2 95%  LMP 05/06/2013 Physical Exam  Nursing note and vitals reviewed.  Constitutional: She is oriented to person, place, and time. She appears well-developed and well-nourished. No distress.  HENT:  Head: Normocephalic and atraumatic.  Right Ear: External ear normal.  Left Ear: External ear normal.  Nose: Nose normal.  Eyes: Conjunctivae are normal.  Neck: Neck supple.  Cardiovascular: Regular rhythm and normal heart sounds.   No murmur heard. Tachycardic.  Pulmonary/Chest: Effort normal.  Decreased air movement bilaterally. Expiratory wheezes bilaterally.   Musculoskeletal: She exhibits no edema.  Neurological: She is alert and oriented to person, place, and time.  Skin: Skin is warm and dry. She is not diaphoretic.  Psychiatric: She has a normal mood and affect.    ED Course  Procedures (including critical care time) DIAGNOSTIC STUDIES: Oxygen Saturation is 95% on adequate, adequate by my interpretation.   COORDINATION OF CARE: 3:46 PM- Will order lab work, give another breathing treatment and give steroid  injection. Advised pt to drink lots of water while here in ED. Pt verbalizes understanding and agrees to plan.  Medications  predniSONE (DELTASONE) tablet 60 mg (not administered)  albuterol (PROVENTIL) (5 MG/ML) 0.5% nebulizer solution 5 mg (5 mg Nebulization Given 05/21/13 1431)  albuterol (PROVENTIL) (5 MG/ML) 0.5% nebulizer solution 5 mg (5 mg Nebulization Given 05/21/13 1514)  ipratropium (ATROVENT) nebulizer solution 0.5 mg (0.5 mg Nebulization Given 05/21/13 1514)  albuterol (PROVENTIL,VENTOLIN) solution continuous neb (10 mg/hr Nebulization Given 05/21/13 1614)   Labs Review Labs Reviewed  CBC WITH DIFFERENTIAL - Abnormal; Notable for the following:    RBC 5.27 (*)    Hemoglobin 16.4 (*)    HCT 47.5 (*)    All other components within normal limits  POCT I-STAT, CHEM 8 - Abnormal; Notable for the following:    Hemoglobin 18.4 (*)    HCT 54.0 (*)    All other components within normal limits  D-DIMER, QUANTITATIVE  POCT I-STAT TROPONIN I   Imaging Review Dg Chest 2 View  05/21/2013   CLINICAL DATA:  Shortness of breath and cough.  EXAM: CHEST  2 VIEW  COMPARISON:  12/24/2012 and 09/28/2012  FINDINGS: The heart size and mediastinal contours are within normal limits. Both lungs are clear. The mild flattening of the hemidiaphragms. The visualized skeletal structures are unremarkable.  IMPRESSION: No active cardiopulmonary disease.   Electronically Signed   By: Elberta Fortis M.D.   On: 05/21/2013 14:57    Date: 05/21/2013  Rate: 118  Rhythm: sinus tachycardia  QRS Axis: normal  Intervals: normal  ST/T Wave abnormalities: normal  Conduction Disutrbances:none  Narrative Interpretation:   Old EKG Reviewed: unchanged   MDM  No diagnosis found.  Pt with chest tightness, wheezing, cough. Hx of bronchitis and smokint. She is anxious appearing. VS show tachycardia, oxygen of 92% on RA. Nebs started, CXR, labs to r/o PE.   5:50 PM Pt feeling better and requesting to be d/c home. Her  oxygen sat is 93-94 % on RA while ambulating. Her d dimer is negative, doubt PE. CXR negative. Wheezing improved. Pt received two regular neb treatments and one hour long. Prednisone 60mg . Will d/c home with inhaler to go, prednisone taper, zithromax. First dose given in ED. Pt instructed to return if worsening. i have offered her more treatments but she stated she was feeling "fine" and really needed to be d/c home.   Filed Vitals:   05/21/13 1617 05/21/13 1649 05/21/13 1651 05/21/13 1738  BP:    129/87  Pulse:  106 108 126  Temp:  TempSrc:      Resp:  22  20  SpO2: 94% 100%  95%    I personally performed the services described in this documentation, which was scribed in my presence. The recorded information has been reviewed and is accurate.   Lottie Mussel, PA-C 05/21/13 1753

## 2013-05-22 NOTE — ED Provider Notes (Signed)
Medical screening examination/treatment/procedure(s) were performed by non-physician practitioner and as supervising physician I was immediately available for consultation/collaboration.   Laray Anger, DO 05/22/13 1254

## 2013-06-16 ENCOUNTER — Emergency Department (HOSPITAL_COMMUNITY)
Admission: EM | Admit: 2013-06-16 | Discharge: 2013-06-16 | Disposition: A | Discharge status: Home / Self Care | Payer: Medicaid Other | Attending: Emergency Medicine | Admitting: Emergency Medicine

## 2013-06-16 ENCOUNTER — Encounter (HOSPITAL_COMMUNITY): Payer: Self-pay | Admitting: Emergency Medicine

## 2013-06-16 DIAGNOSIS — N760 Acute vaginitis: Secondary | ICD-10-CM | POA: Insufficient documentation

## 2013-06-16 DIAGNOSIS — L0231 Cutaneous abscess of buttock: Secondary | ICD-10-CM | POA: Insufficient documentation

## 2013-06-16 DIAGNOSIS — L0291 Cutaneous abscess, unspecified: Secondary | ICD-10-CM

## 2013-06-16 DIAGNOSIS — Z8659 Personal history of other mental and behavioral disorders: Secondary | ICD-10-CM | POA: Insufficient documentation

## 2013-06-16 DIAGNOSIS — F17200 Nicotine dependence, unspecified, uncomplicated: Secondary | ICD-10-CM | POA: Insufficient documentation

## 2013-06-16 DIAGNOSIS — A499 Bacterial infection, unspecified: Secondary | ICD-10-CM | POA: Insufficient documentation

## 2013-06-16 DIAGNOSIS — Z8709 Personal history of other diseases of the respiratory system: Secondary | ICD-10-CM | POA: Insufficient documentation

## 2013-06-16 DIAGNOSIS — IMO0002 Reserved for concepts with insufficient information to code with codable children: Secondary | ICD-10-CM | POA: Insufficient documentation

## 2013-06-16 DIAGNOSIS — Z792 Long term (current) use of antibiotics: Secondary | ICD-10-CM | POA: Insufficient documentation

## 2013-06-16 DIAGNOSIS — B9689 Other specified bacterial agents as the cause of diseases classified elsewhere: Secondary | ICD-10-CM | POA: Insufficient documentation

## 2013-06-16 LAB — WET PREP, GENITAL
Trich, Wet Prep: NONE SEEN
Yeast Wet Prep HPF POC: NONE SEEN

## 2013-06-16 MED ORDER — INSULIN ASPART 100 UNIT/ML ~~LOC~~ SOLN: 5.0000 [IU] | Freq: Once | SUBCUTANEOUS | Status: DC

## 2013-06-16 MED ORDER — METRONIDAZOLE 500 MG PO TABS
500.0000 mg | ORAL_TABLET | Freq: Two times a day (BID) | ORAL | Status: DC
Start: 2013-06-16 — End: 2013-07-09

## 2013-06-16 MED ORDER — OXYCODONE-ACETAMINOPHEN 5-325 MG PO TABS
2.0000 | ORAL_TABLET | Freq: Once | ORAL | Status: AC
  Administered 2013-06-16: 2 via ORAL
  Filled 2013-06-16: qty 2

## 2013-06-16 MED ORDER — OXYCODONE-ACETAMINOPHEN 5-325 MG PO TABS: 1.0000 | ORAL_TABLET | ORAL | Status: DC | PRN

## 2013-06-16 MED ORDER — LIDOCAINE HCL 2 % EX GEL
CUTANEOUS | Status: AC
  Filled 2013-06-16: qty 10

## 2013-06-16 NOTE — ED Notes (Signed)
Pt c/o abscess to buttocks area x1 wk, states hx of same, states using hot compresses. Pt also asking for a pelvic exam d/t ?vaginal d/c and lower pelvic pain, had unprotected intercourse. Pt c/o nausea and no vomiting

## 2013-06-16 NOTE — ED Provider Notes (Signed)
CSN: [id number]     Arrival date & time 06/16/13  3086 History   First MD Initiated Contact with Patient 06/16/13 0932     Chief Complaint  Patient presents with  . Abscess   (Consider location/radiation/quality/duration/timing/severity/associated sxs/prior Treatment) HPI Patient presents emergency department chief complaint of abscess of the gluteal cleft.  Patient states she has a history of abscesses in multiple sites.  Patient denies any fever, chills, nausea, vomiting.  She states it began draining purulent drainage this morning.  Complains of severe pain.  Past Medical History  Diagnosis Date  . Abscess   . Bartholin cyst   . Depression     History - no current prob per pt.  . Anxiety     History - no current prob per pt.  . Bronchitis    Past Surgical History  Procedure Laterality Date  . Tubal ligation    . Leep    . Incison and drainage      Bartholin cyst  . Wisdom tooth extraction    . Svd      x 3  . Bartholin cyst marsupialization  12/03/2011    Procedure: BARTHOLIN CYST MARSUPIALIZATION;  Surgeon: Catalina Antigua, MD;  Location: [facility];  Service: Gynecology;  Laterality: Left;  Bartholin Abcess   Family History  Problem Relation Age of Onset  . Cancer Mother   . Asthma Son    History  Substance Use Topics  . Smoking status: Current Every Day Smoker -- 0.50 packs/day for 24 years    Types: Cigarettes  . Smokeless tobacco: Never Used  . Alcohol Use: No   OB History   Grav Para Term Preterm Abortions TAB SAB Ect Mult Living   4 3 3  1 1    2      Review of Systems  Constitutional: Negative for fever and chills.  Gastrointestinal: Negative for nausea and vomiting.  Skin: Positive for wound.    Allergies  Tramadol and Vicodin  Home Medications   Current Outpatient Rx  Name  Route  Sig  Dispense  Refill  . acetaminophen (TYLENOL) 500 MG tablet   Oral   Take 1,000 mg by mouth every 6 (six) hours as needed for pain (headache).          .  Aspirin-Salicylamide-Caffeine (BC HEADACHE POWDER PO)   Oral   Take 1 packet by mouth 3 (three) times daily as needed (for pain).         Marland Kitchen azithromycin (ZITHROMAX) 250 MG tablet      Take one tab PO daily   4 tablet   0   . predniSONE (DELTASONE) 10 MG tablet      Take 5 tab day 1, take 4 tab day 2, take 3 tab day 3, take 2 tab day 4, and take 1 tab day 5   15 tablet   0   . Pseudoeph-Doxylamine-DM-APAP (NYQUIL PO)   Oral   Take 30 mLs by mouth daily as needed (cold).          BP 129/79  Pulse 117  Temp(Src) 98.1 F (36.7 C) (Oral)  Resp 17  SpO2 98%  LMP 05/06/2013 Physical Exam  Constitutional: She is oriented to person, place, and time. She appears well-developed and well-nourished. No distress.  HENT:  Head: Normocephalic and atraumatic.  Eyes: Conjunctivae are normal. No scleral icterus.  Neck: Normal range of motion.  Cardiovascular: Normal rate, regular rhythm and normal heart sounds.  Exam reveals no gallop and  no friction rub.   No murmur heard. Pulmonary/Chest: Effort normal and breath sounds normal. No respiratory distress.  Abdominal: Soft. Bowel sounds are normal. She exhibits no distension and no mass. There is no tenderness. There is no guarding.  Neurological: She is alert and oriented to person, place, and time.  Skin: Skin is warm and dry. She is not diaphoretic.  Small fluctuant abscess without surrounding induration over the right gluteal cleft.  There is a pinpoint opening with purulent drainage.    ED Course  Procedures (including critical care time) Labs Review Labs Reviewed - No data to display Imaging Review No results found. EKG Interpretation   None      INCISION AND DRAINAGE Performed by: Arthor Captain Consent: Verbal consent obtained. Risks and benefits: risks, benefits and alternatives were discussed Type: abscess  Body area: Gluteal cleft  Anesthesia: local infiltration  Incision was made with a scalpel.  Local  anesthetic: lidocaine 2 % w epinephrine  Anesthetic total: 3 ml  Complexity: complex Blunt dissection to break up loculations  Drainage: purulent  Drainage amount: moderate   Patient tolerance: Patient tolerated the procedure well with no immediate complications.    MDM  No diagnosis found. Patient here with complaint of abscess.  No cellulitic component is noted.  Patient requests pain medication appear   Patient also c/o vaginal discharge and odor. Pelvic exam: normal external genitalia, vulva, vagina, cervix, uterus and adnexa.   Wet prep shows BV.\patient will be discharged with flagyl and pain meds. Patient with skin abscess amenable to incision and drainage.  Abscess was not large enough to warrant packing  wound recheck in 2 days if sxs of infection.  No antibiotic therapy is indicated.    Arthor Captain, PA-C 06/16/13 1223

## 2013-06-16 NOTE — Progress Notes (Signed)
   CARE MANAGEMENT ED NOTE 06/16/2013  Patient:  Jill Mcfarland, Jill Mcfarland   Account Number:  [id number]  Date Initiated:  06/16/2013  Documentation initiated by:  Edd Arbour  Subjective/Objective Assessment:   42 yr old female self pay [place] pt without a pcp for follow up services and difficulty with filling antibiotic rx provided at d/c from [facility] ED today Pt with 5 ED visits in last 6 months     Subjective/Objective Assessment Detail:   Pt aware percocets will not be filled at any pharmacy if an antibiotic  on the same sheet of paper is not filled.  She chooses to have her antibiotic filled first and wait to get her percocets filled later when she has more money Pt spoke with her female friend who will be able to assist her with purchasing her Flagyl fro $9 at [facility] pharmacy and she states she will later take her percocet Rx to be filled at her local [facility]     Action/Plan:   Cm spoke with pt about pcp resources see below note Cm contacted [facility] outpatient pharmacy to find out cost of d/c Rx Flagyl 500 mg bid for 10 day cost $9.00 and percocet 20 tabs $11.25   Action/Plan Detail:   Cm provided pt with address for the Outpatient pharmacy and referred to discounted pharmacy for percocet  CM encourged pt to find a pcp and OB GYN Local Health department information also offered   Anticipated DC Date:  06/16/2013     Status Recommendation to Physician:   Result of Recommendation:    Other ED Services  Consult Working Plan    DC Planning Services  Other  Medication Assistance  PCP issues  Outpatient Services - Pt will follow up    Choice offered to / List presented to:            Status of service:  Completed, signed off  ED Comments:   ED Comments Detail:  During [facility] ED 06/16/13 visit CM spoke with pt who confirms self pay [facility] resident with no pcp. CM discussed and provided written information for self pay pcps, importance of pcp for f/u care,  www.needymeds.org, discounted pharmacies and other [place] resources such as financial assistance, DSS and  health department Reviewed resources for [employer] self pay pcps like [facility], family medicine at [address], [facility] family practice, general medical clinics, [facility] urgent care plus others, [facility] out patient pharmacies and housing Pt voiced understanding and appreciation of resources provided

## 2013-06-16 NOTE — ED Notes (Addendum)
Pt. Aware of giving a urine specimen. Pt. Stated that she already urinated but forgot to give the specimen. Nurse was notified.

## 2013-06-17 LAB — GC/CHLAMYDIA PROBE AMP
CT Probe RNA: NEGATIVE
GC Probe RNA: NEGATIVE

## 2013-06-22 NOTE — ED Provider Notes (Signed)
Medical screening examination/treatment/procedure(s) were performed by non-physician practitioner and as supervising physician I was immediately available for consultation/collaboration.  EKG Interpretation   None        Raeford Razor, MD 06/22/13 0320

## 2013-07-09 ENCOUNTER — Encounter (HOSPITAL_COMMUNITY): Payer: Self-pay | Admitting: Emergency Medicine

## 2013-07-09 ENCOUNTER — Emergency Department (HOSPITAL_COMMUNITY)
Admission: EM | Admit: 2013-07-09 | Discharge: 2013-07-09 | Disposition: A | Discharge status: Home / Self Care | Payer: Medicaid Other | Attending: Emergency Medicine | Admitting: Emergency Medicine

## 2013-07-09 DIAGNOSIS — K089 Disorder of teeth and supporting structures, unspecified: Secondary | ICD-10-CM | POA: Insufficient documentation

## 2013-07-09 DIAGNOSIS — Z8709 Personal history of other diseases of the respiratory system: Secondary | ICD-10-CM | POA: Insufficient documentation

## 2013-07-09 DIAGNOSIS — F17200 Nicotine dependence, unspecified, uncomplicated: Secondary | ICD-10-CM | POA: Insufficient documentation

## 2013-07-09 DIAGNOSIS — Z8659 Personal history of other mental and behavioral disorders: Secondary | ICD-10-CM | POA: Insufficient documentation

## 2013-07-09 DIAGNOSIS — L738 Other specified follicular disorders: Secondary | ICD-10-CM | POA: Insufficient documentation

## 2013-07-09 DIAGNOSIS — D239 Other benign neoplasm of skin, unspecified: Secondary | ICD-10-CM | POA: Insufficient documentation

## 2013-07-09 DIAGNOSIS — D229 Melanocytic nevi, unspecified: Secondary | ICD-10-CM

## 2013-07-09 DIAGNOSIS — L739 Follicular disorder, unspecified: Secondary | ICD-10-CM

## 2013-07-09 DIAGNOSIS — Z7982 Long term (current) use of aspirin: Secondary | ICD-10-CM | POA: Insufficient documentation

## 2013-07-09 DIAGNOSIS — K0889 Other specified disorders of teeth and supporting structures: Secondary | ICD-10-CM

## 2013-07-09 MED ORDER — PENICILLIN V POTASSIUM 500 MG PO TABS: 500.0000 mg | ORAL_TABLET | Freq: Four times a day (QID) | ORAL | Status: AC

## 2013-07-09 MED ORDER — OXYCODONE-ACETAMINOPHEN 5-325 MG PO TABS: 1.0000 | ORAL_TABLET | Freq: Three times a day (TID) | ORAL | Status: DC | PRN

## 2013-07-09 MED ORDER — PENICILLIN V POTASSIUM 500 MG PO TABS
500.0000 mg | ORAL_TABLET | Freq: Once | ORAL | Status: AC
  Administered 2013-07-09: 500 mg via ORAL
  Filled 2013-07-09: qty 1

## 2013-07-09 MED ORDER — OXYCODONE-ACETAMINOPHEN 5-325 MG PO TABS
1.0000 | ORAL_TABLET | Freq: Once | ORAL | Status: AC
  Administered 2013-07-09: 1 via ORAL
  Filled 2013-07-09: qty 1

## 2013-07-09 NOTE — ED Notes (Signed)
Pt reports increased pain and swelling in abscess on upper area of l/buttock. C/o tenderness in mole on l/upper chest

## 2013-07-09 NOTE — ED Provider Notes (Signed)
CSN: [id number]     Arrival date & time 07/09/13  1104 History   First MD Initiated Contact with Patient 07/09/13 1112     Chief Complaint  Patient presents with  . Abscess    l/buttock, red ,raised swollen area  . Nevus    l/upper chest- irritated due to scratch   (Consider location/radiation/quality/duration/timing/severity/associated sxs/prior Treatment) The history is provided by the patient. No language interpreter was used.  Jill Mcfarland is a 42 year old female with past medical history of abscess, Bartholin's cyst, depression, anxiety presenting to emergency department with mole and abscess. As per patient, he abscess has been ongoing for the past week localize the right upper buttocks region. Patient reports that she has history of abscess these are continuously reoccurring. Patient reports that she's been applying hot compressions to site without any drainage noted. Patient reports that the abscess is small but painful especially when sitting. 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 Reports that it hurts especially when touched. Patient is nervous regarding possible skin cancer. Patient reports that there is been no bleeding or drainage noted. Patient reports she's seen by a dermatologist many years ago to remove 3 mol from her back, reports that she does not have the money to see a dermatologist, reports she has not seen one in a long time. Reports she's in the sun a lot and does not use sunscreen. Denied family history of skin cancer. Patient reports that she has been having dental pain, localized to right lower jaw, reports that her gums sometimes bleed-discussed she has not been to the dentist and a very long time. Denied fever, chills, chest pain, shortness of breath, difficulty breathing, nausea, vomiting, neck pain, neck  stiffness, bleeding or drainage from the mole, bleeding or drainage from the teeth, swelling, abscess formation of the teeth, inability to swallow, sore throat. PCP none  Past Medical History  Diagnosis Date  . Abscess   . Bartholin cyst   . Depression     History - no current prob per pt.  . Anxiety     History - no current prob per pt.  . Bronchitis    Past Surgical History  Procedure Laterality Date  . Tubal ligation    . Leep    . Incison and drainage      Bartholin cyst  . Wisdom tooth extraction    . Svd      x 3  . Bartholin cyst marsupialization  12/03/2011    Procedure: BARTHOLIN CYST MARSUPIALIZATION;  Surgeon: Catalina Antigua, MD;  Location: [facility];  Service: Gynecology;  Laterality: Left;  Bartholin Abcess   Family History  Problem Relation Age of Onset  . Cancer Mother   . Asthma Son    History  Substance Use Topics  . Smoking status: Current Every Day Smoker -- 0.50 packs/day for 24 years    Types: Cigarettes  . Smokeless tobacco: Never Used  . Alcohol Use: No   OB History   Grav Para Term Preterm Abortions TAB SAB Ect Mult Living   4 3 3  1 1    2      Review of Systems  Constitutional: Negative for fever and chills.  HENT: Positive for dental problem. Negative for facial swelling and trouble swallowing.   Respiratory: Negative for chest tightness and shortness  of breath.   Cardiovascular: Negative for chest pain.  Gastrointestinal: Negative for nausea, vomiting and diarrhea.  Skin: Positive for wound.  All other systems reviewed and are negative.    Allergies  Tramadol and Vicodin  Home Medications   Current Outpatient Rx  Name  Route  Sig  Dispense  Refill  . acetaminophen (TYLENOL) 500 MG tablet   Oral   Take 1,000 mg by mouth every 6 (six) hours as needed.         Marland Kitchen aspirin EC 81 MG tablet   Oral   Take 81 mg by mouth daily.         . Aspirin-Salicylamide-Caffeine (BC HEADACHE POWDER PO)   Oral   Take 1 packet by mouth every 4  (four) hours as needed (for pain).          Marland Kitchen oxyCODONE-acetaminophen (PERCOCET/ROXICET) 5-325 MG per tablet   Oral   Take 1 tablet by mouth every 8 (eight) hours as needed for severe pain.   11 tablet   0   . penicillin v potassium (VEETID) 500 MG tablet   Oral   Take 1 tablet (500 mg total) by mouth 4 (four) times daily.   40 tablet   0    BP 125/83  Pulse 82  Temp(Src) 97.9 F (36.6 C) (Oral)  Resp 18  SpO2 95% Physical Exam  Nursing note and vitals reviewed. Constitutional: She is oriented to person, place, and time. She appears well-developed and well-nourished. No distress.  HENT:  Head: Normocephalic and atraumatic.  Mouth/Throat: Oropharynx is clear and moist. No oropharyngeal exudate.  Negative facial swelling Poor dentition identified. Numerous teeth missing. Maxillary jaw noted dentures. Mandibular jawline noted with limited teeth that are in decaying process. Right canine diagrammed in decaying. Negative abscess or cyst remission. Negative active bleeding to the gumline. Negative oral lesions noted. Uvula midline, symmetrical elevation. Negative trismus. Negative sublingual lesions.  Eyes: Conjunctivae and EOM are normal. Pupils are equal, round, and reactive to light. Right eye exhibits no discharge. Left eye exhibits no discharge.  Neck: Normal range of motion. Neck supple.  Negative neck stiffness Negative nuchal rigidity  Cardiovascular: Normal rate, regular rhythm and normal heart sounds.  Exam reveals no friction rub.   No murmur heard. Pulses:      Radial pulses are 2+ on the right side, and 2+ on the left side.  Pulmonary/Chest: Effort normal and breath sounds normal. No respiratory distress. She has no wheezes. She has no rales.  Approximately 0.5 cm x 0.5 cm nevus to the upper left-sided chest. Negative surrounding erythema, warmth, swelling noted. Negative active bleeding or drainage noted. Nevus is black. Discomfort upon palpation to the site.   Musculoskeletal: Normal range of motion.  Lymphadenopathy:    She has no cervical adenopathy.  Neurological: She is alert and oriented to person, place, and time. No cranial nerve deficit. She exhibits normal muscle tone. Coordination normal.  Cranial nerves III through XII grossly intact  Skin: Skin is warm and dry. She is not diaphoretic.  Approximately penny sized papule localized to the right upper buttock regions, halo of erythema. Dryness to the skin noted. Discomfort upon palpation. Negative active drainage or bleeding noted. Indurated, hard upon palpation.  Psychiatric: She has a normal mood and affect. Her behavior is normal. Thought content normal.    ED Course  Procedures (including critical care time) Labs Review Labs Reviewed - No data to display Imaging Review No results found.  EKG Interpretation  None       MDM   1. Folliculitis   2. Pain, dental   3. Nevus     Medications  oxyCODONE-acetaminophen (PERCOCET/ROXICET) 5-325 MG per tablet 1 tablet (1 tablet Oral Given 07/09/13 1152)  penicillin v potassium (VEETID) tablet 500 mg (500 mg Oral Given 07/09/13 1152)   Filed Vitals:   07/09/13 1112 07/09/13 1158  BP: 125/83   Pulse: 109 82  Temp: 97.9 F (36.6 C)   TempSrc: Oral   Resp: 16 18  SpO2: 96% 95%   Patient presenting to emergency department with folliculitis localized to the right upper aspect of the buttocks. Mild erythema surrounding the site, and upon palpation, negative warmth upon palpation, negative active bleeding. Heart upon palpation, indurated-approximately 0.5 cm x 0.5 cm. Suspicion to be folliculitis. Patient presenting with a nevus to the left side of the chest-appears to be in process of falling off-dark black-negative surrounding erythema or swelling noted. Mild discomfort upon palpation. Negative bleeding noted. Poor dentition identified-right canine of mandibular jawline diagrammed in decaying process with nerve pulp exposed-negative  sublingual lesions, negative trismus. Doubt peritonsillar abscess. Doubt Ludwig's angina. Nevus cannot rule out malignancy-recommended patient to be followed up with dermatologist, discussed with patient that nevus is to be excised and sent off for biopsy. Patient discharged with small dose of pain medications discussed course, precautions, techniques-and antibiotics regarding dental discomfort-refer to numerous dentists. Discussed with patient to apply antibiotic ointment to site of folliculitis and to continue to apply hot compressions-recommended patient to be reassessed within 2 days. Patient stable, afebrile. Discharged patient. Discussed with patient to closely monitor symptoms and if symptoms are to worsen or change report back to emergency department - strict return structures given. Patient agreed to plan of care, understood, all questions answered.    Raymon Mutton, PA-C 07/10/13 1524

## 2013-07-11 NOTE — ED Provider Notes (Signed)
Medical screening examination/treatment/procedure(s) were performed by non-physician practitioner and as supervising physician I was immediately available for consultation/collaboration.  EKG Interpretation   None         Glynn Octave, MD 07/11/13 [phone]

## 2013-07-31 ENCOUNTER — Encounter (HOSPITAL_COMMUNITY): Payer: Self-pay | Admitting: Emergency Medicine

## 2013-07-31 ENCOUNTER — Emergency Department (HOSPITAL_COMMUNITY)
Admission: EM | Admit: 2013-07-31 | Discharge: 2013-07-31 | Discharge status: Against Medical Advice | Payer: Medicaid Other | Attending: Emergency Medicine | Admitting: Emergency Medicine

## 2013-07-31 DIAGNOSIS — F3289 Other specified depressive episodes: Secondary | ICD-10-CM | POA: Insufficient documentation

## 2013-07-31 DIAGNOSIS — F17200 Nicotine dependence, unspecified, uncomplicated: Secondary | ICD-10-CM | POA: Insufficient documentation

## 2013-07-31 DIAGNOSIS — Z8709 Personal history of other diseases of the respiratory system: Secondary | ICD-10-CM | POA: Insufficient documentation

## 2013-07-31 DIAGNOSIS — Z7982 Long term (current) use of aspirin: Secondary | ICD-10-CM | POA: Insufficient documentation

## 2013-07-31 DIAGNOSIS — F329 Major depressive disorder, single episode, unspecified: Secondary | ICD-10-CM | POA: Insufficient documentation

## 2013-07-31 DIAGNOSIS — F411 Generalized anxiety disorder: Secondary | ICD-10-CM | POA: Insufficient documentation

## 2013-07-31 DIAGNOSIS — L089 Local infection of the skin and subcutaneous tissue, unspecified: Secondary | ICD-10-CM | POA: Insufficient documentation

## 2013-07-31 DIAGNOSIS — Z79899 Other long term (current) drug therapy: Secondary | ICD-10-CM | POA: Insufficient documentation

## 2013-07-31 LAB — BASIC METABOLIC PANEL
BUN: 4 mg/dL — ABNORMAL LOW (ref 6–23)
CO2: 26 mEq/L (ref 19–32)
Calcium: 9.6 mg/dL (ref 8.4–10.5)
Chloride: 104 mEq/L (ref 96–112)
Creatinine, Ser: 0.49 mg/dL — ABNORMAL LOW (ref 0.50–1.10)
GFR calc Af Amer: >90 mL/min (ref >90)
GFR calc non Af Amer: >90 mL/min (ref >90)
Glucose, Bld: 105 mg/dL — ABNORMAL HIGH (ref 70–99)
Potassium: 4.3 mEq/L (ref 3.5–5.1)
Sodium: 141 mEq/L (ref 135–145)

## 2013-07-31 LAB — CBC WITH DIFFERENTIAL/PLATELET
Basophils Absolute: 0 10*3/uL (ref 0.0–0.1)
Basophils Relative: 0 % (ref 0–1)
Eosinophils Absolute: 0.3 10*3/uL (ref 0.0–0.7)
Eosinophils Relative: 3 % (ref 0–5)
HCT: 46.5 % — ABNORMAL HIGH (ref 36.0–46.0)
Hemoglobin: 16 g/dL — ABNORMAL HIGH (ref 12.0–15.0)
Lymphocytes Relative: 25 % (ref 12–46)
Lymphs Abs: 2.7 10*3/uL (ref 0.7–4.0)
MCH: 32.3 pg (ref 26.0–34.0)
MCHC: 34.4 g/dL (ref 30.0–36.0)
MCV: 93.8 fL (ref 78.0–100.0)
Monocytes Absolute: 0.6 10*3/uL (ref 0.1–1.0)
Monocytes Relative: 5 % (ref 3–12)
Neutro Abs: 7 10*3/uL (ref 1.7–7.7)
Neutrophils Relative %: 66 % (ref 43–77)
Platelets: 264 10*3/uL (ref 150–400)
RBC: 4.96 MIL/uL (ref 3.87–5.11)
RDW: 13.7 % (ref 11.5–15.5)
WBC: 10.6 10*3/uL — ABNORMAL HIGH (ref 4.0–10.5)

## 2013-07-31 NOTE — ED Notes (Signed)
Unable to locate pt in triage.  

## 2013-07-31 NOTE — ED Notes (Signed)
Unable to locate

## 2013-07-31 NOTE — ED Notes (Signed)
Boil on rt upper butt cheek and it is red x 1 month size of golf ball

## 2013-08-15 ENCOUNTER — Emergency Department: Payer: Self-pay | Admitting: Emergency Medicine

## 2013-08-18 ENCOUNTER — Emergency Department: Payer: Self-pay | Admitting: Emergency Medicine

## 2013-09-11 ENCOUNTER — Emergency Department: Payer: Self-pay | Admitting: Emergency Medicine

## 2013-10-07 ENCOUNTER — Emergency Department: Payer: Self-pay | Admitting: Internal Medicine

## 2013-12-21 ENCOUNTER — Emergency Department: Payer: Self-pay | Admitting: Emergency Medicine

## 2014-04-17 ENCOUNTER — Encounter (HOSPITAL_COMMUNITY): Payer: Self-pay | Admitting: Emergency Medicine

## 2014-04-17 ENCOUNTER — Emergency Department (HOSPITAL_COMMUNITY)
Admission: EM | Admit: 2014-04-17 | Discharge: 2014-04-17 | Disposition: A | Discharge status: Home / Self Care | Payer: Medicaid Other | Attending: Emergency Medicine | Admitting: Emergency Medicine

## 2014-04-17 DIAGNOSIS — K0889 Other specified disorders of teeth and supporting structures: Secondary | ICD-10-CM

## 2014-04-17 DIAGNOSIS — K08109 Complete loss of teeth, unspecified cause, unspecified class: Secondary | ICD-10-CM | POA: Diagnosis not present

## 2014-04-17 DIAGNOSIS — Z8709 Personal history of other diseases of the respiratory system: Secondary | ICD-10-CM | POA: Insufficient documentation

## 2014-04-17 DIAGNOSIS — K029 Dental caries, unspecified: Secondary | ICD-10-CM | POA: Insufficient documentation

## 2014-04-17 DIAGNOSIS — Z8659 Personal history of other mental and behavioral disorders: Secondary | ICD-10-CM | POA: Diagnosis not present

## 2014-04-17 DIAGNOSIS — Z8742 Personal history of other diseases of the female genital tract: Secondary | ICD-10-CM | POA: Diagnosis not present

## 2014-04-17 DIAGNOSIS — K069 Disorder of gingiva and edentulous alveolar ridge, unspecified: Secondary | ICD-10-CM

## 2014-04-17 DIAGNOSIS — Z872 Personal history of diseases of the skin and subcutaneous tissue: Secondary | ICD-10-CM | POA: Insufficient documentation

## 2014-04-17 DIAGNOSIS — K006 Disturbances in tooth eruption: Secondary | ICD-10-CM | POA: Insufficient documentation

## 2014-04-17 DIAGNOSIS — K056 Periodontal disease, unspecified: Secondary | ICD-10-CM | POA: Diagnosis not present

## 2014-04-17 DIAGNOSIS — Z7982 Long term (current) use of aspirin: Secondary | ICD-10-CM | POA: Diagnosis not present

## 2014-04-17 DIAGNOSIS — F17200 Nicotine dependence, unspecified, uncomplicated: Secondary | ICD-10-CM | POA: Diagnosis not present

## 2014-04-17 DIAGNOSIS — K089 Disorder of teeth and supporting structures, unspecified: Secondary | ICD-10-CM | POA: Diagnosis present

## 2014-04-17 MED ORDER — FLUCONAZOLE 150 MG PO TABS: 150.0000 mg | ORAL_TABLET | Freq: Every day | ORAL | Status: DC

## 2014-04-17 MED ORDER — OXYCODONE-ACETAMINOPHEN 5-325 MG PO TABS
1.0000 | ORAL_TABLET | Freq: Once | ORAL | Status: AC
  Administered 2014-04-17: 1 via ORAL
  Filled 2014-04-17: qty 1

## 2014-04-17 MED ORDER — PENICILLIN V POTASSIUM 500 MG PO TABS: 500.0000 mg | ORAL_TABLET | Freq: Three times a day (TID) | ORAL | Status: DC

## 2014-04-17 MED ORDER — PENICILLIN V POTASSIUM 500 MG PO TABS
500.0000 mg | ORAL_TABLET | Freq: Once | ORAL | Status: AC
  Administered 2014-04-17: 500 mg via ORAL
  Filled 2014-04-17: qty 1

## 2014-04-17 MED ORDER — OXYCODONE-ACETAMINOPHEN 5-325 MG PO TABS: 1.0000 | ORAL_TABLET | Freq: Four times a day (QID) | ORAL | Status: DC | PRN

## 2014-04-17 NOTE — ED Notes (Signed)
Per pt, dental pain constant.  Worse in last month.  Trying to get coverage to have them pulled.

## 2014-04-17 NOTE — ED Provider Notes (Signed)
Medical screening examination/treatment/procedure(s) were performed by non-physician practitioner and as supervising physician I was immediately available for consultation/collaboration.   EKG Interpretation None        [name], MD 04/17/14 1850

## 2014-04-17 NOTE — Discharge Instructions (Signed)
Please read and follow all provided instructions.  Your diagnoses today include:  1. Pain, dental    The exam and treatment you received today has been provided on an emergency basis only. This is not a substitute for complete medical or dental care.  Tests performed today include:  Vital signs. See below for your results today.   Medications prescribed:   Percocet (oxycodone/acetaminophen) - narcotic pain medication  DO NOT drive or perform any activities that require you to be awake and alert because this medicine can make you drowsy. BE VERY CAREFUL not to take multiple medicines containing Tylenol (also called acetaminophen). Doing so can lead to an overdose which can damage your liver and cause liver failure and possibly death.   Penicillin - antibiotic  You have been prescribed an antibiotic medicine: take the entire course of medicine even if you are feeling better. Stopping early can cause the antibiotic not to work.  Take any prescribed medications only as directed.  Home care instructions:  Follow any educational materials contained in this packet.  Follow-up instructions: Please follow-up with your dentist for further evaluation of your symptoms.   Dental Assistance: See below for dental referrals  Return instructions:   Please return to the Emergency Department if you experience worsening symptoms.  Please return if you develop a fever, you develop more swelling in your face or neck, you have trouble breathing or swallowing food.  Please return if you have any other emergent concerns.  Additional Information:  Your vital signs today were: LMP 04/03/2014 If your blood pressure (BP) was elevated above 135/85 this visit, please have this repeated by your doctor within one month. -------------- Dental Care: Organization         Address  Phone  Notes  [facility] Department of [facility] [place] [phone] Accepts children up to age 58 who are enrolled in [place] or [place]; pregnant women with a Medicaid card; and children who have applied for Medicaid or [place] Health Choice, but were declined, whose parents can pay a reduced fee at time of service.  [facility] Department of [facility]  [address], [place] [phone] Accepts children up to age 35 who are enrolled in [place] or [place]; pregnant women with a Medicaid card; and children who have applied for Medicaid or [place] Health Choice, but were declined, whose parents can pay a reduced fee at time of service.  [facility]  [place] [phone] Patients are seen by appointment only. Walk-ins are not accepted. [place] will see patients 44 years of age and older. Monday - Tuesday (8am-5pm) Most Wednesdays (8:30-5pm) $30 per visit, cash only  [facility] Adult Dental Access PRO[facility]  [address], [facility] [phone] Patients are seen by appointment only. Walk-ins are not accepted. [place] will see patients 64 years of age and older. One Wednesday Evening (Monthly: Volunteer Based).  $30 per visit, cash only  [place]  [phone] for adults; Children under age 57, call Graduate Pediatric Dentistry at [phone]. Children aged 63-14, please call [phone] to request a pediatric application.  Dental services are provided in all areas of dental care including fillings, crowns and bridges, complete and partial dentures, implants, gum treatment, root canals, and extractions. Preventive care is also provided. Treatment is provided to both adults and children.  Patients are selected via a lottery and there is often a waiting list.   [facility] [address], [place]  [phone] [email or web address]   [facility] [address] [place], [place] [phone], Ext.  123 Second and Fourth Thursday of each month, opens at 6:30 AM; Clinic ends at 9 AM.  Patients are seen on a first-come first-served basis, and a limited number are seen during each clinic.   [facility]  [address] [place], [place] [phone]   Eligibility Requirements You must have lived in [place], [place], or [place] counties for at least the last three months.   You cannot be eligible for state or federal sponsored [employer], including [employer], [place], or [employer].   You generally cannot be eligible for healthcare insurance through your employer.    How to apply: Eligibility screenings are held every Tuesday and Wednesday afternoon from 1:00 pm until 4:00 pm. You do not need an appointment for the interview!  [facility] [address] [place], [place]   [place]  [place]  [place]  [phone]

## 2014-04-17 NOTE — ED Provider Notes (Signed)
CSN: [id number]     Arrival date & time 04/17/14  1222 History   First MD Initiated Contact with Patient 04/17/14 1224    This chart was scribed for [name], MD by [name], ED Scribe. This patient was seen in room WTR8/WTR8 and the patient's care was started at 12:35 PM.   Chief Complaint  Patient presents with  . Dental Pain   The history is provided by the patient. No language interpreter was used.   HPI Comments: Jill Mcfarland is a 43 y.o. female who presents to the Emergency Department complaining of dental pain worse this month. She reports similar symptoms previous. She reports associated swelling of lower mouth that has decreased at this time. She states she is currently applying for Medicaid coverage to have her teeth pulled.  Past Medical History  Diagnosis Date  . Abscess   . Bartholin cyst   . Depression     History - no current prob per pt.  . Anxiety     History - no current prob per pt.  . Bronchitis    Past Surgical History  Procedure Laterality Date  . Tubal ligation    . Leep    . Incison and drainage      Bartholin cyst  . Wisdom tooth extraction    . Svd      x 3  . Bartholin cyst marsupialization  12/03/2011    Procedure: BARTHOLIN CYST MARSUPIALIZATION;  Surgeon: Mora Bellman, MD;  Location: [facility];  Service: Gynecology;  Laterality: Left;  Bartholin Abcess   Family History  Problem Relation Age of Onset  . Cancer Mother   . Asthma Son    History  Substance Use Topics  . Smoking status: Current Every Day Smoker -- 0.50 packs/day for 24 years    Types: Cigarettes  . Smokeless tobacco: Never Used  . Alcohol Use: No   OB History   Grav Para Term Preterm Abortions TAB SAB Ect Mult Living   4 3 3  1 1    2      Review of Systems  Constitutional: Negative for fever.  HENT: Positive for dental problem. Negative for ear pain, facial swelling, sore throat and trouble swallowing.   Respiratory: Negative for shortness of breath and  stridor.   Musculoskeletal: Negative for neck pain.  Skin: Negative for color change.  Neurological: Negative for headaches.      Allergies  Tramadol and Vicodin  Home Medications   Prior to Admission medications   Medication Sig Start Date End Date Taking? Authorizing Provider  acetaminophen (TYLENOL) 500 MG tablet Take 1,000 mg by mouth every 6 (six) hours as needed.    Historical Provider, MD  aspirin EC 81 MG tablet Take 81 mg by mouth daily.    Historical Provider, MD  Aspirin-Salicylamide-Caffeine (BC HEADACHE POWDER PO) Take 1 packet by mouth every 4 (four) hours as needed (for pain).     Historical Provider, MD  oxyCODONE-acetaminophen (PERCOCET/ROXICET) 5-325 MG per tablet Take 1 tablet by mouth every 8 (eight) hours as needed for severe pain. 07/09/13   Marissa Sciacca, PA-C   LMP 04/03/2014 Physical Exam  Nursing note and vitals reviewed. Constitutional: She appears well-developed and well-nourished.  HENT:  Head: Normocephalic and atraumatic.  Right Ear: Tympanic membrane, external ear and ear canal normal.  Left Ear: Tympanic membrane, external ear and ear canal normal.  Nose: Nose normal.  Mouth/Throat: Uvula is midline, oropharynx is clear and moist and mucous membranes  are normal. No trismus in the jaw. Abnormal dentition. Dental caries present. No dental abscesses or uvula swelling. No tonsillar abscesses.  Severe periodontal disease. Majority of teeth are missing. Patient with L mandibular tooth pain and tenderness to palpation in area of 1st and 2nd excisors. Only 5 lower teeth present. Mild swelling or erythema noted on exam.  Eyes: Conjunctivae are normal.  Neck: Normal range of motion. Neck supple.  No neck swelling or Ludwig's angina  Lymphadenopathy:    She has no cervical adenopathy.  Neurological: She is alert.  Skin: Skin is warm and dry.  Psychiatric: She has a normal mood and affect.    ED Course  Procedures (including critical care time) Labs  Review Labs Reviewed - No data to display  Imaging Review No results found.   EKG Interpretation None     COORDINATION OF CARE: Patient seen and examined. Medications ordered.   Vital signs reviewed and are as follows: BP 126/84  Pulse 108  Temp(Src) 98.4 F (36.9 C) (Oral)  Resp 18  SpO2 96%  LMP 04/03/2014  Patient counseled on use of narcotic pain medications. Counseled not to combine these medications with others containing tylenol. Urged not to drink alcohol, drive, or perform any other activities that requires focus while taking these medications. The patient verbalizes understanding and agrees with the plan.  Patient counseled to take prescribed medications as directed, return with worsening facial or neck swelling, and to follow-up with their dentist as soon as possible.    MDM   Final diagnoses:  Pain, dental   Patient with toothache. No fever. Exam unconcerning for Ludwig's angina or other deep tissue infection in neck.   As there is gum swelling, erythema, and facial swelling (resolved), will treat with antibiotic and pain medicine. Urged patient to follow-up with dentist.        [name], PA-C 04/17/14 1251

## 2014-04-19 ENCOUNTER — Emergency Department: Payer: Self-pay | Admitting: Emergency Medicine

## 2014-04-23 ENCOUNTER — Emergency Department: Payer: Self-pay | Admitting: Emergency Medicine

## 2014-05-08 IMAGING — CR DG CERVICAL SPINE COMPLETE 4+V
6 series · 6 of 6 positions shown · non-contrast
Comparison: None

CLINICAL DATA: Neck and shoulder pain.  No known injury.

CERVICAL SPINE - COMPLETE 4+ VIEW

[w cervical spine lat]
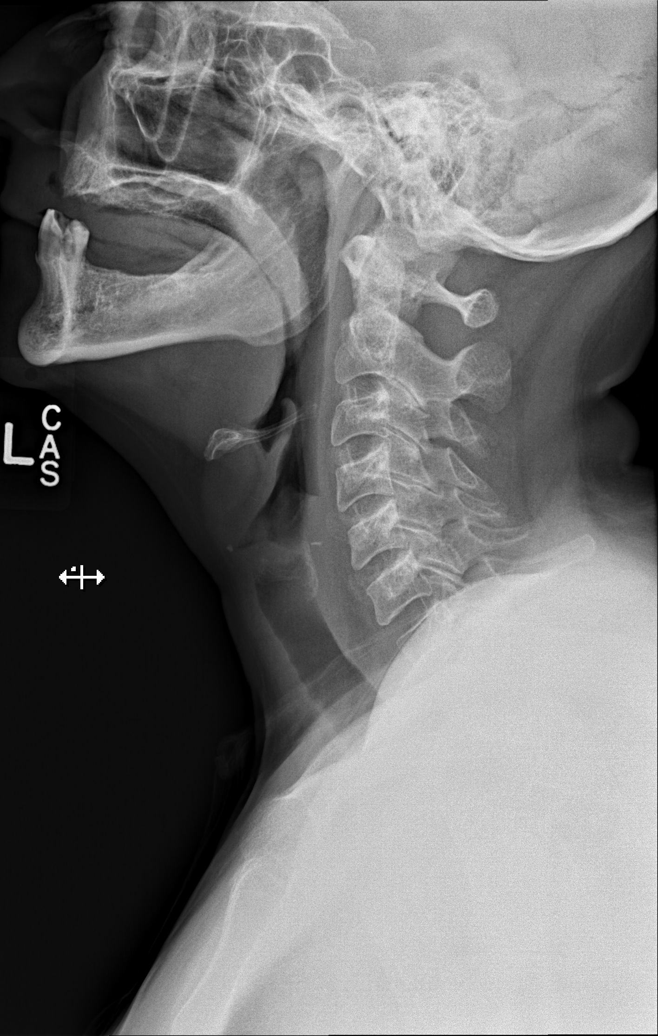

[w cervical swimmers]
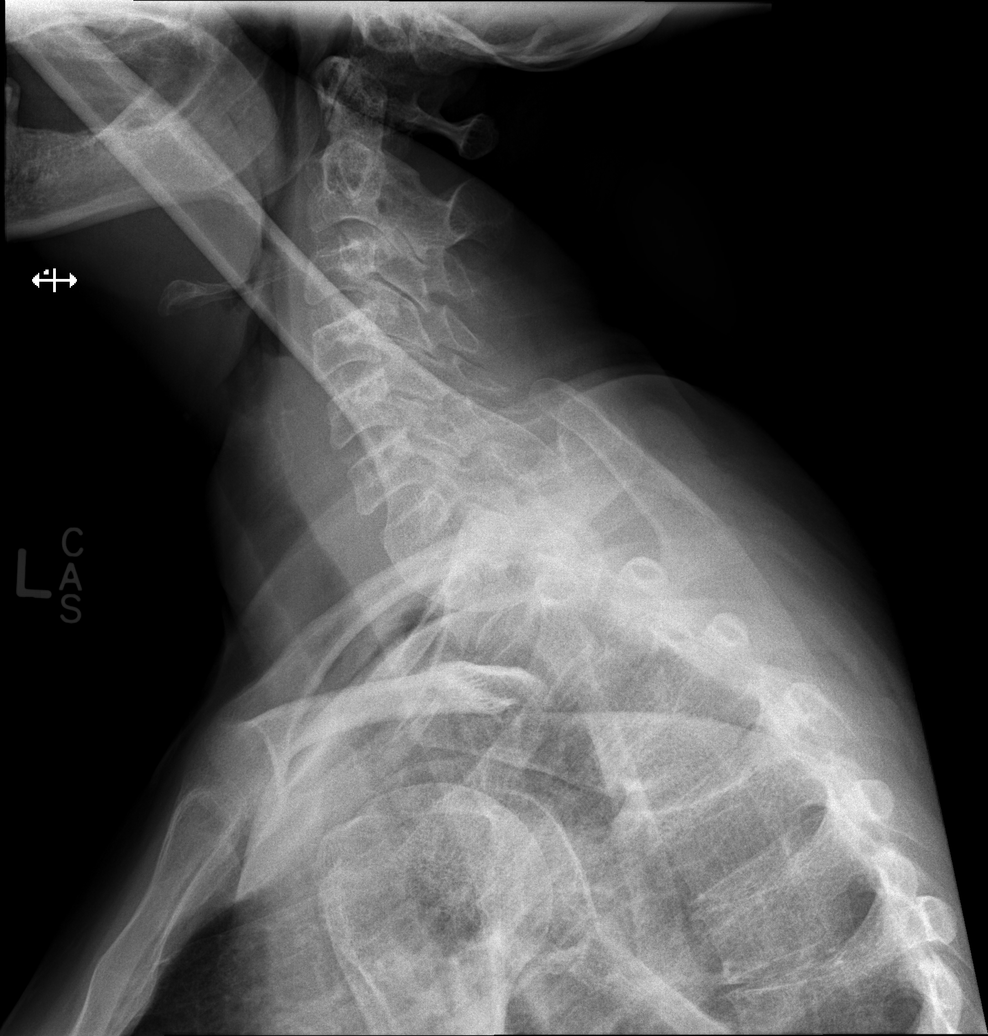

[w cervical spine ap_obl (1 of 2)]
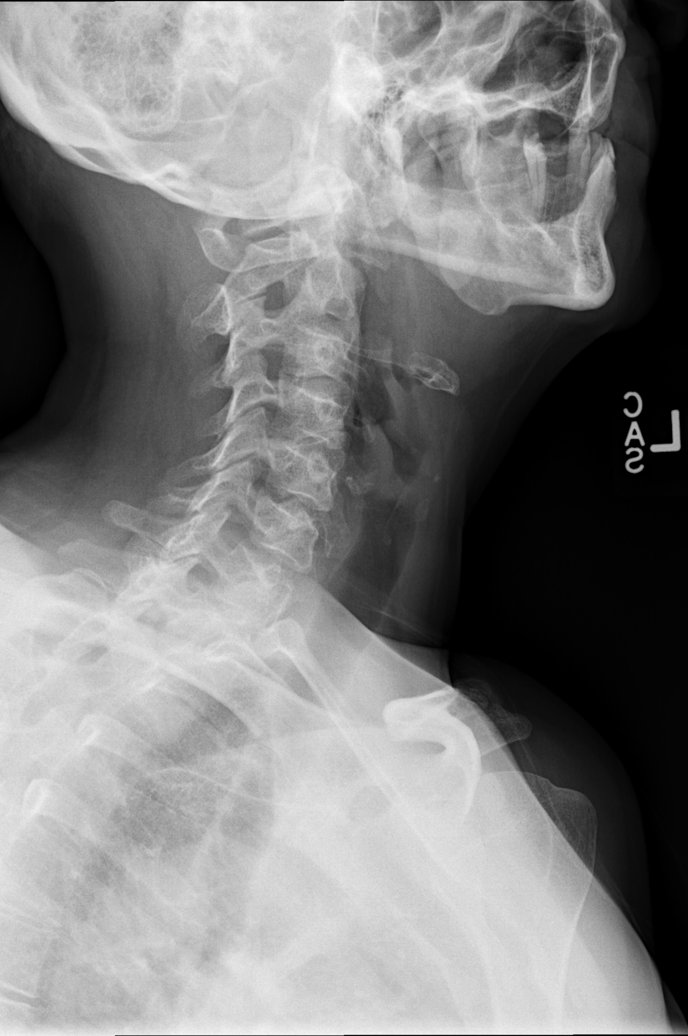

[w cervical spine ap_obl (2 of 2)]
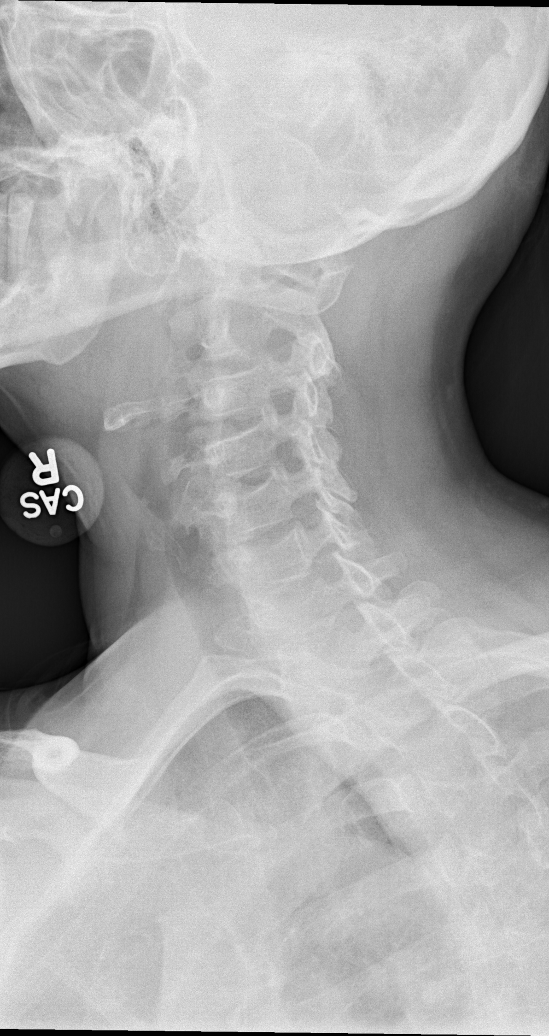

[w cervical spine ap]
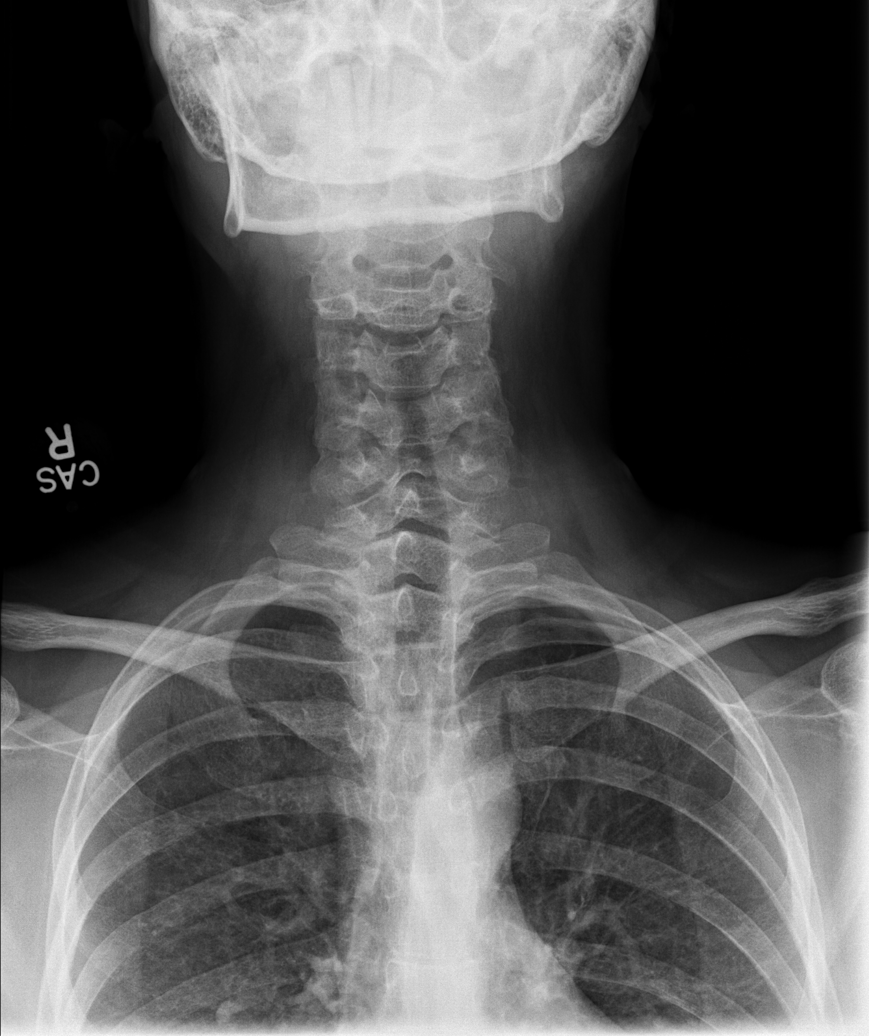

[w cervical spine odontoid]
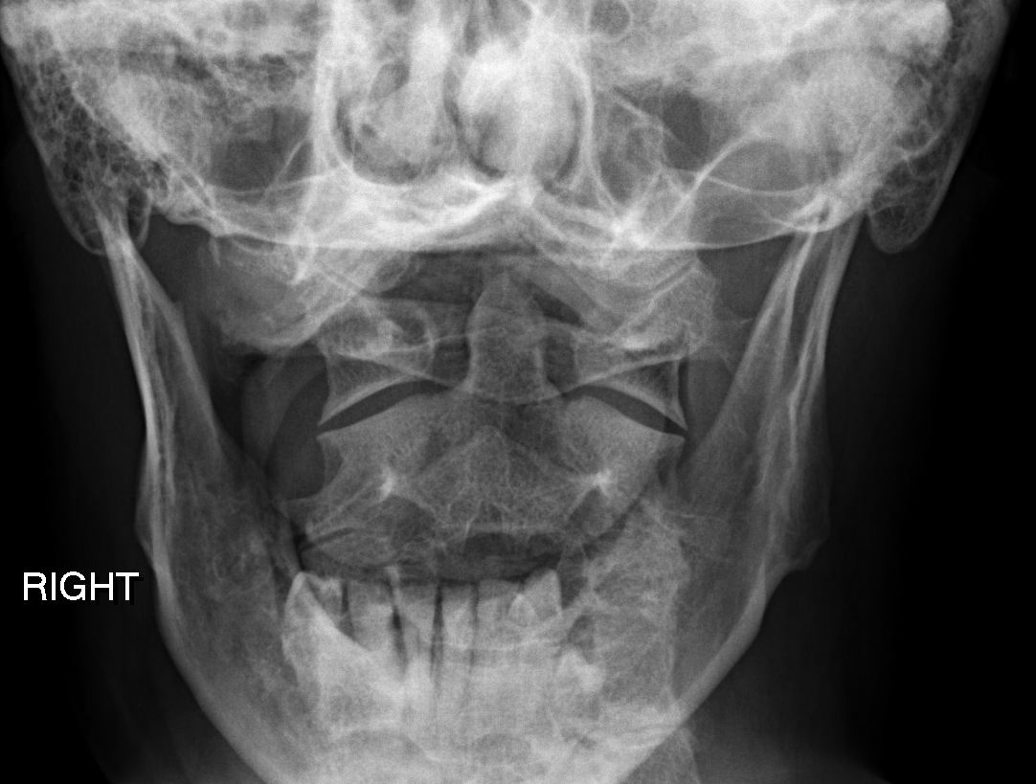

[6 of 6 positions shown; findings below may reference images not displayed]

FINDINGS: The lateral film demonstrates normal alignment of the
cervical vertebral bodies.  Disc spaces and vertebral bodies are
maintained.  No acute bony findings or abnormal prevertebral soft
tissue swelling.

The oblique films demonstrate normally aligned articular facets and
patent neural foramen.  The C1-C2 articulations are maintained.
The lung apices are clear.
IMPRESSION: Normal alignment and no acute bony findings.

## 2014-05-20 ENCOUNTER — Emergency Department (HOSPITAL_COMMUNITY)
Admission: EM | Admit: 2014-05-20 | Discharge: 2014-05-20 | Disposition: A | Discharge status: Home / Self Care | Payer: Medicaid Other | Attending: Emergency Medicine | Admitting: Emergency Medicine

## 2014-05-20 ENCOUNTER — Encounter (HOSPITAL_COMMUNITY): Payer: Self-pay | Admitting: Emergency Medicine

## 2014-05-20 DIAGNOSIS — Z720 Tobacco use: Secondary | ICD-10-CM | POA: Insufficient documentation

## 2014-05-20 DIAGNOSIS — Z7982 Long term (current) use of aspirin: Secondary | ICD-10-CM | POA: Insufficient documentation

## 2014-05-20 DIAGNOSIS — K088 Other specified disorders of teeth and supporting structures: Secondary | ICD-10-CM | POA: Insufficient documentation

## 2014-05-20 DIAGNOSIS — Z8659 Personal history of other mental and behavioral disorders: Secondary | ICD-10-CM | POA: Diagnosis not present

## 2014-05-20 DIAGNOSIS — Z792 Long term (current) use of antibiotics: Secondary | ICD-10-CM | POA: Diagnosis not present

## 2014-05-20 DIAGNOSIS — R51 Headache: Secondary | ICD-10-CM | POA: Insufficient documentation

## 2014-05-20 DIAGNOSIS — Z8742 Personal history of other diseases of the female genital tract: Secondary | ICD-10-CM | POA: Insufficient documentation

## 2014-05-20 DIAGNOSIS — Z79899 Other long term (current) drug therapy: Secondary | ICD-10-CM | POA: Insufficient documentation

## 2014-05-20 DIAGNOSIS — H9209 Otalgia, unspecified ear: Secondary | ICD-10-CM | POA: Insufficient documentation

## 2014-05-20 DIAGNOSIS — K0889 Other specified disorders of teeth and supporting structures: Secondary | ICD-10-CM

## 2014-05-20 MED ORDER — PENICILLIN V POTASSIUM 500 MG PO TABS: 500.0000 mg | ORAL_TABLET | Freq: Three times a day (TID) | ORAL | Status: DC

## 2014-05-20 MED ORDER — PENICILLIN V POTASSIUM 500 MG PO TABS
500.0000 mg | ORAL_TABLET | Freq: Once | ORAL | Status: AC
  Administered 2014-05-20: 500 mg via ORAL
  Filled 2014-05-20: qty 1

## 2014-05-20 MED ORDER — OXYCODONE-ACETAMINOPHEN 5-325 MG PO TABS
1.0000 | ORAL_TABLET | Freq: Once | ORAL | Status: AC
  Administered 2014-05-20: 1 via ORAL
  Filled 2014-05-20: qty 1

## 2014-05-20 MED ORDER — FLUCONAZOLE 150 MG PO TABS: 150.0000 mg | ORAL_TABLET | Freq: Every day | ORAL | Status: DC

## 2014-05-20 MED ORDER — OXYCODONE-ACETAMINOPHEN 5-325 MG PO TABS: 1.0000 | ORAL_TABLET | Freq: Four times a day (QID) | ORAL | Status: DC | PRN

## 2014-05-20 NOTE — Discharge Instructions (Signed)
Please read and follow all provided instructions.  Your diagnoses today include:  1. Pain, dental     The exam and treatment you received today has been provided on an emergency basis only. This is not a substitute for complete medical or dental care.  Tests performed today include:  Vital signs. See below for your results today.   Medications prescribed:   Percocet (oxycodone/acetaminophen) - narcotic pain medication  DO NOT drive or perform any activities that require you to be awake and alert because this medicine can make you drowsy. BE VERY CAREFUL not to take multiple medicines containing Tylenol (also called acetaminophen). Doing so can lead to an overdose which can damage your liver and cause liver failure and possibly death.   Penicillin - antibiotic  You have been prescribed an antibiotic medicine: take the entire course of medicine even if you are feeling better. Stopping early can cause the antibiotic not to work.   Diflucan - medication for yeast infection  Take any prescribed medications only as directed.  Home care instructions:  Follow any educational materials contained in this packet.  Follow-up instructions: Please follow-up with your dentist for further evaluation of your symptoms.   Dental Assistance: See below for dental referrals  Return instructions:   Please return to the Emergency Department if you experience worsening symptoms.  Please return if you develop a fever, you develop more swelling in your face or neck, you have trouble breathing or swallowing food.  Please return if you have any other emergent concerns.  Additional Information:  Your vital signs today were: BP 111/70   Pulse 120   Temp(Src) 98.2 F (36.8 C) (Oral)   Resp 18   SpO2 96%   LMP 04/20/2014 If your blood pressure (BP) was elevated above 135/85 this visit, please have this repeated by your doctor within one month. -------------- Dental Care: Organization          Address  Phone  Notes  [facility] Department of [facility] [phone] Accepts children up to age 35 who are enrolled in [place] or [place]; pregnant women with a Medicaid card; and children who have applied for Medicaid or [place] Health Choice, but were declined, whose parents can pay a reduced fee at time of service.  [facility] Department of [facility]  [address], [place] [phone] Accepts children up to age 56 who are enrolled in [place] or [place]; pregnant women with a Medicaid card; and children who have applied for Medicaid or [place] Health Choice, but were declined, whose parents can pay a reduced fee at time of service.  [facility]  [place] [phone] Patients are seen by appointment only. Walk-ins are not accepted. [place] will see patients 83 years of age and older. Monday - Tuesday (8am-5pm) Most Wednesdays (8:30-5pm) $30 per visit, cash only  [facility]  [address], [facility] [phone] Patients are seen by appointment only. Walk-ins are not accepted. [place] will see patients 39 years of age and older. One Wednesday Evening (Monthly: Volunteer Based).  $30 per visit, cash only  [place]  [phone] for adults; Children under age 30, call Graduate Pediatric Dentistry at [phone]. Children aged 17-14, please call [phone] to request a pediatric application.  Dental services are provided in all  areas of dental care including fillings, crowns and bridges, complete and partial dentures, implants, gum treatment, root canals, and extractions. Preventive care is also provided. Treatment is provided to both adults and children. Patients are selected via a lottery and there is often a waiting list.   [facility] [address], [place]  [phone] [email or web address]   [facility] [address] [place], [place] [phone], Ext. [phone] Second and Fourth Thursday of each month, opens at 6:30 AM; Clinic ends at 9 AM.  Patients are seen on a first-come first-served basis, and a limited number are seen during each clinic.   [facility]  [address] [place], [place] [phone]   Eligibility Requirements You must have lived in [place], [place], or [place] counties for at least the last three months.   You cannot be eligible for state or federal sponsored [employer], including [employer], [place], or [employer].   You generally cannot be eligible for healthcare insurance through your employer.    How to apply: Eligibility screenings are held every Tuesday and Wednesday afternoon from 1:00 pm until 4:00 pm. You do not need an appointment for the interview!  [facility] [address] [place], [place]   [place]  [place]  [place]  [phone]

## 2014-05-20 NOTE — ED Provider Notes (Signed)
CSN: [id number]     Arrival date & time 05/20/14  1329 History  This chart was scribed for Jill Mast, PA-C, working with Jill Hamburger, MD by Marti Sleigh, ED Scribe. This patient was seen in room WTR7/WTR7 and the patient's care was started at 1:54 PM.    Chief Complaint  Patient presents with  . Dental Pain    l/and r/lower teeth pain  . Headache   The history is provided by the patient. No language interpreter was used.   HPI Comments: Jill Mcfarland is a 43 y.o. female who presents to the Emergency Department complaining of inferior, anterior tooth pain. She has h/o multiple extractions. Pt states that she is having HA and right facial pain as associated symptoms. Pt has a past hx of tooth pain in the same area, and she states she is going to have her teeth pulled once she is able to enroll in Medicaid. Pt has had all maxillary teeth pulled.   Past Medical History  Diagnosis Date  . Abscess   . Bartholin cyst   . Depression     History - no current prob per pt.  . Anxiety     History - no current prob per pt.  . Bronchitis    Past Surgical History  Procedure Laterality Date  . Tubal ligation    . Leep    . Incison and drainage      Bartholin cyst  . Wisdom tooth extraction    . Svd      x 3  . Bartholin cyst marsupialization  12/03/2011    Procedure: BARTHOLIN CYST MARSUPIALIZATION;  Surgeon: Mora Bellman, MD;  Location: [facility];  Service: Gynecology;  Laterality: Left;  Bartholin Abcess   Family History  Problem Relation Age of Onset  . Cancer Mother   . Asthma Son    History  Substance Use Topics  . Smoking status: Current Every Day Smoker -- 0.50 packs/day for 24 years    Types: Cigarettes  . Smokeless tobacco: Never Used  . Alcohol Use: No   OB History   Grav Para Term Preterm Abortions TAB SAB Ect Mult Living   4 3 3  1 1    2      Review of Systems  Constitutional: Negative for fever, activity change and appetite change.  HENT: Positive for  dental problem and ear pain. Negative for facial swelling, sore throat and trouble swallowing.   Respiratory: Negative for shortness of breath and stridor.   Musculoskeletal: Negative for neck pain and neck stiffness.  Skin: Negative for color change.  Neurological: Positive for headaches.      Allergies  Tramadol and Vicodin  Home Medications   Prior to Admission medications   Medication Sig Start Date End Date Taking? Authorizing Provider  acetaminophen (TYLENOL) 500 MG tablet Take 1,000 mg by mouth every 6 (six) hours as needed.    Historical Provider, MD  aspirin EC 81 MG tablet Take 81 mg by mouth daily.    Historical Provider, MD  Aspirin-Salicylamide-Caffeine (BC HEADACHE POWDER PO) Take 1 packet by mouth every 4 (four) hours as needed (for pain).     Historical Provider, MD  fluconazole (DIFLUCAN) 150 MG tablet Take 1 tablet (150 mg total) by mouth daily. 04/17/14   [name], PA-C  oxyCODONE-acetaminophen (PERCOCET/ROXICET) 5-325 MG per tablet Take 1-2 tablets by mouth every 6 (six) hours as needed for severe pain. 04/17/14   [name], PA-C  penicillin v potassium (VEETID)  500 MG tablet Take 1 tablet (500 mg total) by mouth 3 (three) times daily. 04/17/14   [name], PA-C   BP 111/70  Pulse 120  Temp(Src) 98.2 F (36.8 C) (Oral)  Resp 18  SpO2 96%  LMP 04/20/2014  Physical Exam  Nursing note and vitals reviewed. Constitutional: She is oriented to person, place, and time. She appears well-developed and well-nourished.  HENT:  Head: Normocephalic and atraumatic.  Right Ear: Tympanic membrane, external ear and ear canal normal.  Left Ear: Tympanic membrane, external ear and ear canal normal.  Nose: Nose normal.  Mouth/Throat: Uvula is midline, oropharynx is clear and moist and mucous membranes are normal. No trismus in the jaw. Abnormal dentition. Dental caries present. No dental abscesses or uvula swelling. No tonsillar abscesses.  Patient with L mandibular  tooth pain and tenderness to palpation in area of 2nd incisor. Extensive periodontal disease with only 6 intact lower teeth. Tenderness along gumline. No discrete abscess.   Eyes: Conjunctivae and EOM are normal. Pupils are equal, round, and reactive to light.  Neck: Normal range of motion. Neck supple.  No neck swelling or Ludwig's angina  Cardiovascular: Normal rate, regular rhythm and normal heart sounds.   HR 100 on my exam.   Pulmonary/Chest: Effort normal and breath sounds normal.  Abdominal: Soft. Bowel sounds are normal.  Musculoskeletal: Normal range of motion.  Lymphadenopathy:    She has no cervical adenopathy.  Neurological: She is alert and oriented to person, place, and time.  Skin: Skin is warm and dry.  Psychiatric: She has a normal mood and affect. Her behavior is normal.    ED Course  Procedures  DIAGNOSTIC STUDIES: Oxygen Saturation is 96% on RA, adequate by my interpretation.    COORDINATION OF CARE: 1:59 PM Discussed treatment plan with pt at bedside and pt agreed to plan.  Labs Review Labs Reviewed - No data to display  Imaging Review No results found.   EKG Interpretation None      Patient seen and examined.   Vital signs reviewed and are as follows: BP 111/70  Pulse 120  Temp(Src) 98.2 F (36.8 C) (Oral)  Resp 18  SpO2 96%  LMP 04/20/2014  Patient counseled on use of narcotic pain medications. Counseled not to combine these medications with others containing tylenol. Urged not to drink alcohol, drive, or perform any other activities that requires focus while taking these medications. The patient verbalizes understanding and agrees with the plan.  Patient counseled to take prescribed medications as directed, return with worsening facial or neck swelling, and to follow-up with their dentist as soon as possible.     MDM   Final diagnoses:  Pain, dental   Patient with toothache. No fever. Exam unconcerning for Ludwig's angina or other deep  tissue infection in neck.   As there is gum swelling, will treat with antibiotic and pain medicine. Urged patient to follow-up with dentist.    I personally performed the services described in this documentation, which was scribed in my presence. The recorded information has been reviewed and is accurate.    [name], PA-C 05/20/14 1435

## 2014-05-20 NOTE — ED Provider Notes (Signed)
Medical screening examination/treatment/procedure(s) were performed by non-physician practitioner and as supervising physician I was immediately available for consultation/collaboration.   EKG Interpretation None        Ephraim Hamburger, MD 05/20/14 1524

## 2014-05-20 NOTE — ED Notes (Signed)
Pt reports increased dental pain over last week. Hx of same

## 2014-05-25 ENCOUNTER — Encounter (HOSPITAL_COMMUNITY): Payer: Self-pay | Admitting: Emergency Medicine

## 2014-05-25 ENCOUNTER — Emergency Department (HOSPITAL_COMMUNITY)
Admission: EM | Admit: 2014-05-25 | Discharge: 2014-05-25 | Disposition: A | Discharge status: Home / Self Care | Payer: Medicaid Other | Attending: Emergency Medicine | Admitting: Emergency Medicine

## 2014-05-25 DIAGNOSIS — Z720 Tobacco use: Secondary | ICD-10-CM | POA: Insufficient documentation

## 2014-05-25 DIAGNOSIS — Z7902 Long term (current) use of antithrombotics/antiplatelets: Secondary | ICD-10-CM | POA: Diagnosis not present

## 2014-05-25 DIAGNOSIS — Z8742 Personal history of other diseases of the female genital tract: Secondary | ICD-10-CM | POA: Diagnosis not present

## 2014-05-25 DIAGNOSIS — K088 Other specified disorders of teeth and supporting structures: Secondary | ICD-10-CM | POA: Insufficient documentation

## 2014-05-25 DIAGNOSIS — Z872 Personal history of diseases of the skin and subcutaneous tissue: Secondary | ICD-10-CM | POA: Insufficient documentation

## 2014-05-25 DIAGNOSIS — Z79899 Other long term (current) drug therapy: Secondary | ICD-10-CM | POA: Diagnosis not present

## 2014-05-25 DIAGNOSIS — Z8709 Personal history of other diseases of the respiratory system: Secondary | ICD-10-CM | POA: Diagnosis not present

## 2014-05-25 DIAGNOSIS — Z8659 Personal history of other mental and behavioral disorders: Secondary | ICD-10-CM | POA: Diagnosis not present

## 2014-05-25 DIAGNOSIS — K0889 Other specified disorders of teeth and supporting structures: Secondary | ICD-10-CM

## 2014-05-25 NOTE — Discharge Instructions (Signed)

## 2014-05-25 NOTE — ED Provider Notes (Signed)
CSN: [id number]     Arrival date & time 05/25/14  1734 History  This chart was scribed for non-physician practitioner, Glendell Docker, NP working with Virgel Manifold, MD by Tula Nakayama, ED scribe. This patient was seen in room WTR8/WTR8 and the patient's care was started at 6:22 PM  Chief Complaint  Patient presents with  . Dental Pain    gum pain   The history is provided by the patient. No language interpreter was used.   HPI Comments: Jill Mcfarland is a 43 y.o. female who presents to the Emergency Department complaining of lower dental pain that started 5 days ago when she had 5 lower teeth extracted. Pt has a follow-up appointment with her dentist in 3 weeks. Pt is currently taking Penicillin as prescribed by her dentist. Pt denies following up with her dentist regarding pain.    Past Medical History  Diagnosis Date  . Abscess   . Bartholin cyst   . Depression     History - no current prob per pt.  . Anxiety     History - no current prob per pt.  . Bronchitis    Past Surgical History  Procedure Laterality Date  . Tubal ligation    . Leep    . Incison and drainage      Bartholin cyst  . Wisdom tooth extraction    . Svd      x 3  . Bartholin cyst marsupialization  12/03/2011    Procedure: BARTHOLIN CYST MARSUPIALIZATION;  Surgeon: Mora Bellman, MD;  Location: [facility];  Service: Gynecology;  Laterality: Left;  Bartholin Abcess   Family History  Problem Relation Age of Onset  . Cancer Mother   . Asthma Son    History  Substance Use Topics  . Smoking status: Current Every Day Smoker -- 0.50 packs/day for 24 years    Types: Cigarettes  . Smokeless tobacco: Never Used  . Alcohol Use: No   OB History   Grav Para Term Preterm Abortions TAB SAB Ect Mult Living   4 3 3  1 1    2      Review of Systems  HENT: Positive for dental problem.   All other systems reviewed and are negative.     Allergies  Tramadol and Vicodin  Home Medications   Prior to Admission  medications   Medication Sig Start Date End Date Taking? Authorizing Provider  acetaminophen (TYLENOL) 500 MG tablet Take 1,000 mg by mouth every 6 (six) hours as needed for mild pain or moderate pain.    Historical Provider, MD  fluconazole (DIFLUCAN) 150 MG tablet Take 1 tablet (150 mg total) by mouth daily. 05/20/14   [name], PA-C  oxyCODONE-acetaminophen (PERCOCET/ROXICET) 5-325 MG per tablet Take 1-2 tablets by mouth every 6 (six) hours as needed for severe pain. 05/20/14   [name], PA-C  penicillin v potassium (VEETID) 500 MG tablet Take 1 tablet (500 mg total) by mouth 3 (three) times daily. 05/20/14   [name], PA-C   BP 115/92  Pulse 114  Temp(Src) 99.8 F (37.7 C) (Oral)  Resp 18  SpO2 95%  LMP 04/20/2014 Physical Exam  Nursing note and vitals reviewed. Constitutional: She appears well-developed and well-nourished. No distress.  HENT:  Head: Normocephalic and atraumatic.  Healing lower gum in mouth  Eyes: Conjunctivae and EOM are normal.  Neck: Neck supple. No tracheal deviation present.  Cardiovascular: Normal rate.   Pulmonary/Chest: Effort normal. No respiratory distress.  Skin: Skin is  warm and dry.  Psychiatric: She has a normal mood and affect. Her behavior is normal.    ED Course  Procedures (including critical care time) DIAGNOSTIC STUDIES: Oxygen Saturation is 95% on RA, adequate by my interpretation.    COORDINATION OF CARE: 6:24 PM Discussed lack of infection in region and advised to follow-up with dentist sooner if she still has issues. Discussed treatment plan with pt at bedside and pt agreed to plan.  Labs Review Labs Reviewed - No data to display  Imaging Review No results found.   EKG Interpretation None      MDM   Final diagnoses:  Pain, dental   No sign of infection. Pt is on pcn. No sign of ludwigs angina  I personally performed the services described in this documentation, which was scribed in my presence. The recorded  information has been reviewed and is accurate.   Glendell Docker, NP 05/25/14 1831

## 2014-05-25 NOTE — ED Notes (Signed)
Pt is 5 day days after extraction of 5 lower teeth . Pt c/o increased gum pain

## 2014-05-26 ENCOUNTER — Encounter (HOSPITAL_BASED_OUTPATIENT_CLINIC_OR_DEPARTMENT_OTHER): Payer: Self-pay | Admitting: Emergency Medicine

## 2014-05-26 ENCOUNTER — Emergency Department (HOSPITAL_BASED_OUTPATIENT_CLINIC_OR_DEPARTMENT_OTHER)
Admission: EM | Admit: 2014-05-26 | Discharge: 2014-05-26 | Disposition: A | Discharge status: Home / Self Care | Payer: Medicaid Other | Attending: Emergency Medicine | Admitting: Emergency Medicine

## 2014-05-26 ENCOUNTER — Emergency Department (HOSPITAL_BASED_OUTPATIENT_CLINIC_OR_DEPARTMENT_OTHER): Payer: Medicaid Other

## 2014-05-26 DIAGNOSIS — Z79899 Other long term (current) drug therapy: Secondary | ICD-10-CM | POA: Diagnosis not present

## 2014-05-26 DIAGNOSIS — Z8659 Personal history of other mental and behavioral disorders: Secondary | ICD-10-CM | POA: Diagnosis not present

## 2014-05-26 DIAGNOSIS — J209 Acute bronchitis, unspecified: Secondary | ICD-10-CM | POA: Insufficient documentation

## 2014-05-26 DIAGNOSIS — Z872 Personal history of diseases of the skin and subcutaneous tissue: Secondary | ICD-10-CM | POA: Insufficient documentation

## 2014-05-26 DIAGNOSIS — R05 Cough: Secondary | ICD-10-CM | POA: Diagnosis present

## 2014-05-26 DIAGNOSIS — Z720 Tobacco use: Secondary | ICD-10-CM | POA: Insufficient documentation

## 2014-05-26 MED ORDER — PREDNISONE 50 MG PO TABS
60.0000 mg | ORAL_TABLET | Freq: Once | ORAL | Status: AC
  Administered 2014-05-26: 60 mg via ORAL
  Filled 2014-05-26 (×2): qty 1

## 2014-05-26 MED ORDER — ALBUTEROL SULFATE HFA 108 (90 BASE) MCG/ACT IN AERS
2.0000 | INHALATION_SPRAY | RESPIRATORY_TRACT | Status: DC | PRN
  Administered 2014-05-26: 2 via RESPIRATORY_TRACT
  Filled 2014-05-26: qty 6.7

## 2014-05-26 MED ORDER — BENZONATATE 100 MG PO CAPS: 100.0000 mg | ORAL_CAPSULE | Freq: Three times a day (TID) | ORAL | Status: DC

## 2014-05-26 MED ORDER — PREDNISONE 10 MG PO TABS: 20.0000 mg | ORAL_TABLET | Freq: Two times a day (BID) | ORAL | Status: DC

## 2014-05-26 MED ORDER — AZITHROMYCIN 250 MG PO TABS: 250.0000 mg | ORAL_TABLET | Freq: Every day | ORAL | Status: DC

## 2014-05-26 MED ORDER — AEROCHAMBER PLUS FLO-VU MEDIUM MISC
1.0000 | Freq: Once | Status: DC
  Filled 2014-05-26: qty 1

## 2014-05-26 NOTE — ED Notes (Signed)
Cough x 1 week with chest and back pain

## 2014-05-26 NOTE — Discharge Instructions (Signed)

## 2014-05-26 NOTE — ED Provider Notes (Signed)
CSN: [id number]     Arrival date & time 05/26/14  1425 History  This chart was scribed for Jill Pollack, MD by [name], ED Scribe. This patient was seen in room MH12/MH12 and the patient's care was started at 5:04 PM.    Chief Complaint  Patient presents with  . Cough   Patient is a 43 y.o. female presenting with cough. The history is provided by the patient. No language interpreter was used.  Cough Cough characteristics:  Non-productive Severity:  Moderate Duration:  1 week Timing:  Constant Chronicity:  New Smoker: yes   Context: sick contacts   Relieved by:  Nothing Exacerbated by: at night. Ineffective treatments: Penicillin. Associated symptoms: rhinorrhea, shortness of breath and sore throat   Associated symptoms: no fever     HPI Comments: Jill Mcfarland is a 43 y.o. female with history of smoking who presents to the Emergency Department complaining of cough with onset 1 week ago. She states it started with congestion, rhinorrhea, and sore throat. She states she has been taking penicillin since 6 days ago without remission of symptoms. She states that her son has strep throat. She states her cough is worse at night. She states her cough is not productive. She reports associated shortness of breath. She reports a baseline elevated heart rate; she states she has been recommended to see a cardiologist but has not done so due to not having insurance. She denies significant chronic medical problems. She reports having teeth removed 5 days ago. She denies fever.  Past Medical History  Diagnosis Date  . Abscess   . Bartholin cyst   . Depression     History - no current prob per pt.  . Anxiety     History - no current prob per pt.  . Bronchitis    Past Surgical History  Procedure Laterality Date  . Tubal ligation    . Leep    . Incison and drainage      Bartholin cyst  . Wisdom tooth extraction    . Svd      x 3  . Bartholin cyst marsupialization  12/03/2011   Procedure: BARTHOLIN CYST MARSUPIALIZATION;  Surgeon: Mora Bellman, MD;  Location: [facility];  Service: Gynecology;  Laterality: Left;  Bartholin Abcess   Family History  Problem Relation Age of Onset  . Cancer Mother   . Asthma Son    History  Substance Use Topics  . Smoking status: Current Every Day Smoker -- 0.50 packs/day for 24 years    Types: Cigarettes  . Smokeless tobacco: Never Used  . Alcohol Use: No   OB History   Grav Para Term Preterm Abortions TAB SAB Ect Mult Living   4 3 3  1 1    2      Review of Systems  Constitutional: Negative for fever.  HENT: Positive for congestion, rhinorrhea and sore throat.   Respiratory: Positive for cough and shortness of breath.   Psychiatric/Behavioral: Positive for sleep disturbance.  All other systems reviewed and are negative.     Allergies  Tramadol and Vicodin  Home Medications   Prior to Admission medications   Medication Sig Start Date End Date Taking? Authorizing Provider  acetaminophen (TYLENOL) 500 MG tablet Take 1,000 mg by mouth every 6 (six) hours as needed for mild pain or moderate pain.   Yes Historical Provider, MD  penicillin v potassium (VEETID) 500 MG tablet Take 1 tablet (500 mg total) by mouth 3 (three) times  daily. 05/20/14  Yes [name], PA-C  fluconazole (DIFLUCAN) 150 MG tablet Take 1 tablet (150 mg total) by mouth daily. 05/20/14   [name], PA-C  oxyCODONE-acetaminophen (PERCOCET/ROXICET) 5-325 MG per tablet Take 1-2 tablets by mouth every 6 (six) hours as needed for severe pain. 05/20/14   [name], PA-C   BP 106/86  Pulse 84  Temp(Src) 98.3 F (36.8 C) (Oral)  Resp 18  Ht 5\' 2"  (1.575 m)  Wt 140 lb (63.504 kg)  BMI 25.60 kg/m2  SpO2 96%  LMP 04/20/2014 Physical Exam  Nursing note and vitals reviewed. Constitutional: She is oriented to person, place, and time. She appears well-developed and well-nourished. No distress.  HENT:  Head: Normocephalic and atraumatic.  Right Ear:  External ear normal.  Left Ear: External ear normal.  Nose: Nose normal.  Eyes: EOM are normal.  Neck: Normal range of motion. Neck supple.  Cardiovascular: Normal rate, regular rhythm and normal heart sounds.   No murmur heard. Pulmonary/Chest: Effort normal. No respiratory distress. She has no wheezes. She has no rales.  Decreased breath sounds  Musculoskeletal: Normal range of motion.  Neurological: She is alert and oriented to person, place, and time. She exhibits normal muscle tone. Coordination normal.  Skin: Skin is warm and dry.  Psychiatric: She has a normal mood and affect. Her behavior is normal. Thought content normal.    ED Course  Procedures (including critical care time) Labs Review Labs Reviewed - No data to display  Imaging Review Dg Chest 2 View  05/26/2014   CLINICAL DATA:  Cough and upper chest pain for 1 week.  EXAM: CHEST  2 VIEW  COMPARISON:  May 21, 2013  FINDINGS: The heart size and mediastinal contours are within normal limits. There is no focal infiltrate, pulmonary edema, or pleural effusion. The visualized skeletal structures are stable.  IMPRESSION: No active cardiopulmonary disease.   Electronically Signed   By: Abelardo Diesel M.D.   On: 05/26/2014 15:32     EKG Interpretation   Date/Time:  Saturday May 26 2014 15:03:43 EDT Ventricular Rate:  104 PR Interval:  126 QRS Duration: 70 QT Interval:  348 QTC Calculation: 457 R Axis:   76 Text Interpretation:  Sinus tachycardia Otherwise normal ECG No  significant change was found Confirmed by Wyvonnia Dusky  MD, STEPHEN [phone]) on  05/26/2014 3:08:49 PM     DIAGNOSTIC STUDIES: Oxygen Saturation is 96% on room air, normal by my interpretation.    COORDINATION OF CARE: 5:11 PM Will stop penicillin and start on another antibiotic. Discussed with her that smoking can prolong her symptoms and make her more susceptible to viral infections. I will also prescribe an inhaler and steroid. The patient agrees  with the plan and has no further questions at this time.    MDM   Final diagnoses:  Acute bronchitis, unspecified organism      I personally performed the services described in this documentation, which was scribed in my presence. The recorded information has been reviewed and considered.    Jill Pollack, MD 05/26/14 [phone]

## 2014-05-27 NOTE — ED Provider Notes (Signed)
Medical screening examination/treatment/procedure(s) were performed by non-physician practitioner and as supervising physician I was immediately available for consultation/collaboration.   EKG Interpretation None       Virgel Manifold, MD 05/27/14 0000

## 2014-06-18 ENCOUNTER — Encounter (HOSPITAL_BASED_OUTPATIENT_CLINIC_OR_DEPARTMENT_OTHER): Payer: Self-pay | Admitting: Emergency Medicine

## 2014-09-11 ENCOUNTER — Emergency Department (HOSPITAL_COMMUNITY)
Admission: EM | Admit: 2014-09-11 | Discharge: 2014-09-11 | Disposition: A | Discharge status: Home / Self Care | Payer: Medicaid Other | Attending: Emergency Medicine | Admitting: Emergency Medicine

## 2014-09-11 ENCOUNTER — Encounter (HOSPITAL_COMMUNITY): Payer: Self-pay

## 2014-09-11 DIAGNOSIS — Z8709 Personal history of other diseases of the respiratory system: Secondary | ICD-10-CM | POA: Diagnosis not present

## 2014-09-11 DIAGNOSIS — Z7952 Long term (current) use of systemic steroids: Secondary | ICD-10-CM | POA: Insufficient documentation

## 2014-09-11 DIAGNOSIS — Z720 Tobacco use: Secondary | ICD-10-CM | POA: Insufficient documentation

## 2014-09-11 DIAGNOSIS — Z792 Long term (current) use of antibiotics: Secondary | ICD-10-CM | POA: Diagnosis not present

## 2014-09-11 DIAGNOSIS — Z8742 Personal history of other diseases of the female genital tract: Secondary | ICD-10-CM | POA: Insufficient documentation

## 2014-09-11 DIAGNOSIS — Z79899 Other long term (current) drug therapy: Secondary | ICD-10-CM | POA: Diagnosis not present

## 2014-09-11 DIAGNOSIS — Z8659 Personal history of other mental and behavioral disorders: Secondary | ICD-10-CM | POA: Insufficient documentation

## 2014-09-11 DIAGNOSIS — L02412 Cutaneous abscess of left axilla: Secondary | ICD-10-CM | POA: Diagnosis not present

## 2014-09-11 DIAGNOSIS — L0291 Cutaneous abscess, unspecified: Secondary | ICD-10-CM

## 2014-09-11 MED ORDER — OXYCODONE-ACETAMINOPHEN 5-325 MG PO TABS
1.0000 | ORAL_TABLET | Freq: Once | ORAL | Status: AC
  Administered 2014-09-11: 1 via ORAL
  Filled 2014-09-11: qty 1

## 2014-09-11 MED ORDER — ONDANSETRON 4 MG PO TBDP
4.0000 mg | ORAL_TABLET | Freq: Once | ORAL | Status: AC
  Administered 2014-09-11: 4 mg via ORAL
  Filled 2014-09-11: qty 1

## 2014-09-11 MED ORDER — SULFAMETHOXAZOLE-TRIMETHOPRIM 800-160 MG PO TABS
1.0000 | ORAL_TABLET | Freq: Once | ORAL | Status: AC
  Administered 2014-09-11: 1 via ORAL
  Filled 2014-09-11: qty 1

## 2014-09-11 MED ORDER — FLUCONAZOLE 200 MG PO TABS: 200.0000 mg | ORAL_TABLET | Freq: Every day | ORAL | Status: AC

## 2014-09-11 MED ORDER — SULFAMETHOXAZOLE-TRIMETHOPRIM 800-160 MG PO TABS: 1.0000 | ORAL_TABLET | Freq: Two times a day (BID) | ORAL | Status: DC

## 2014-09-11 MED ORDER — OXYCODONE-ACETAMINOPHEN 5-325 MG PO TABS: 1.0000 | ORAL_TABLET | Freq: Four times a day (QID) | ORAL | Status: DC | PRN

## 2014-09-11 NOTE — Discharge Instructions (Signed)
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
## 2014-09-11 NOTE — ED Notes (Signed)
Pt reports onset 2 days boil underneath left axilla and onset this morning boil left groin.  Painful.  Pt has h/o boils.

## 2014-11-02 ENCOUNTER — Emergency Department (HOSPITAL_COMMUNITY)
Admission: EM | Admit: 2014-11-02 | Discharge: 2014-11-02 | Disposition: A | Discharge status: Home / Self Care | Payer: No Typology Code available for payment source | Attending: Emergency Medicine | Admitting: Emergency Medicine

## 2014-11-02 DIAGNOSIS — Z792 Long term (current) use of antibiotics: Secondary | ICD-10-CM | POA: Insufficient documentation

## 2014-11-02 DIAGNOSIS — Z7952 Long term (current) use of systemic steroids: Secondary | ICD-10-CM | POA: Insufficient documentation

## 2014-11-02 DIAGNOSIS — S80811A Abrasion, right lower leg, initial encounter: Secondary | ICD-10-CM | POA: Insufficient documentation

## 2014-11-02 DIAGNOSIS — Z8709 Personal history of other diseases of the respiratory system: Secondary | ICD-10-CM | POA: Diagnosis not present

## 2014-11-02 DIAGNOSIS — Z79899 Other long term (current) drug therapy: Secondary | ICD-10-CM | POA: Insufficient documentation

## 2014-11-02 DIAGNOSIS — Z720 Tobacco use: Secondary | ICD-10-CM | POA: Diagnosis not present

## 2014-11-02 DIAGNOSIS — Y998 Other external cause status: Secondary | ICD-10-CM | POA: Diagnosis not present

## 2014-11-02 DIAGNOSIS — S29092A Other injury of muscle and tendon of back wall of thorax, initial encounter: Secondary | ICD-10-CM | POA: Insufficient documentation

## 2014-11-02 DIAGNOSIS — Y92410 Unspecified street and highway as the place of occurrence of the external cause: Secondary | ICD-10-CM | POA: Insufficient documentation

## 2014-11-02 DIAGNOSIS — Z872 Personal history of diseases of the skin and subcutaneous tissue: Secondary | ICD-10-CM | POA: Insufficient documentation

## 2014-11-02 DIAGNOSIS — F419 Anxiety disorder, unspecified: Secondary | ICD-10-CM | POA: Diagnosis not present

## 2014-11-02 DIAGNOSIS — M546 Pain in thoracic spine: Secondary | ICD-10-CM

## 2014-11-02 DIAGNOSIS — Y9389 Activity, other specified: Secondary | ICD-10-CM | POA: Diagnosis not present

## 2014-11-02 DIAGNOSIS — S3992XA Unspecified injury of lower back, initial encounter: Secondary | ICD-10-CM | POA: Diagnosis present

## 2014-11-02 DIAGNOSIS — S199XXA Unspecified injury of neck, initial encounter: Secondary | ICD-10-CM | POA: Insufficient documentation

## 2014-11-02 MED ORDER — METHOCARBAMOL 500 MG PO TABS: 500.0000 mg | ORAL_TABLET | Freq: Four times a day (QID) | ORAL | Status: DC

## 2014-11-02 MED ORDER — IBUPROFEN 600 MG PO TABS: 600.0000 mg | ORAL_TABLET | Freq: Four times a day (QID) | ORAL | Status: DC | PRN

## 2014-11-02 MED ORDER — HYDROCODONE-ACETAMINOPHEN 5-325 MG PO TABS: 2.0000 | ORAL_TABLET | ORAL | Status: DC | PRN

## 2014-11-02 MED ORDER — HYDROCODONE-ACETAMINOPHEN 7.5-325 MG/15ML PO SOLN
10.0000 mL | Freq: Once | ORAL | Status: AC
Start: 2014-11-02 — End: 2014-11-02
  Administered 2014-11-02: 10 mL via ORAL
  Filled 2014-11-02: qty 15
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
## 2014-11-02 NOTE — Progress Notes (Signed)
Pt pcp is wayland mckenzie per emedicaid response EPIC updated. Pt given her pcp name and DSS # if she prefrs to have DSS change to another dr Hanley Seamen ED RN pt pcp information fo f/u care
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
## 2014-11-02 NOTE — Discharge Instructions (Signed)
Back Pain, Adult Low back pain is very common. About 1 in 5 people have back pain.The cause of low back pain is rarely dangerous. The pain often gets better over time.About half of people with a sudden onset of back pain feel better in just 2 weeks. About 8 in 10 people feel better by 6 weeks.  CAUSES Some common causes of back pain include:  Strain of the muscles or ligaments supporting the spine.  Wear and tear (degeneration) of the spinal discs.  Arthritis.  Direct injury to the back. DIAGNOSIS Most of the time, the direct cause of low back pain is not known.However, back pain can be treated effectively even when the exact cause of the pain is unknown.Answering your caregiver's questions about your overall health and symptoms is one of the most accurate ways to make sure the cause of your pain is not dangerous. If your caregiver needs more information, he or she may order lab work or imaging tests (X-rays or MRIs).However, even if imaging tests show changes in your back, this usually does not require surgery. HOME CARE INSTRUCTIONS For many people, back pain returns.Since low back pain is rarely dangerous, it is often a condition that people can learn to manageon their own.   Remain active. It is stressful on the back to sit or stand in one place. Do not sit, drive, or stand in one place for more than 30 minutes at a time. Take short walks on level surfaces as soon as pain allows.Try to increase the length of time you walk each day.  Do not stay in bed.Resting more than 1 or 2 days can delay your recovery.  Do not avoid exercise or work.Your body is made to move.It is not dangerous to be active, even though your back may hurt.Your back will likely heal faster if you return to being active before your pain is gone.  Pay attention to your body when you bend and lift. Many people have less discomfortwhen lifting if they bend their knees, keep the load close to their bodies,and  avoid twisting. Often, the most comfortable positions are those that put less stress on your recovering back.  Find a comfortable position to sleep. Use a firm mattress and lie on your side with your knees slightly bent. If you lie on your back, put a pillow under your knees.  Only take over-the-counter or prescription medicines as directed by your caregiver. Over-the-counter medicines to reduce pain and inflammation are often the most helpful.Your caregiver may prescribe muscle relaxant drugs.These medicines help dull your pain so you can more quickly return to your normal activities and healthy exercise.  Put ice on the injured area.  Put ice in a plastic bag.  Place a towel between your skin and the bag.  Leave the ice on for 15-20 minutes, 03-04 times a day for the first 2 to 3 days. After that, ice and heat may be alternated to reduce pain and spasms.  Ask your caregiver about trying back exercises and gentle massage. This may be of some benefit.  Avoid feeling anxious or stressed.Stress increases muscle tension and can worsen back pain.It is important to recognize when you are anxious or stressed and learn ways to manage it.Exercise is a great option. SEEK MEDICAL CARE IF:  You have pain that is not relieved with rest or medicine.  You have pain that does not improve in 1 week.  You have new symptoms.  You are generally not feeling well. SEEK   IMMEDIATE MEDICAL CARE IF:   You have pain that radiates from your back into your legs.  You develop new bowel or bladder control problems.  You have unusual weakness or numbness in your arms or legs.  You develop nausea or vomiting.  You develop abdominal pain.  You feel faint. Document Released: 08/03/2005 Document Revised: 02/02/2012 Document Reviewed: 12/05/2013 Exit[employer] Patient Information 2015 [employer]. This information is not intended to replace advice given to you by your health care provider. Make sure you  discuss any questions you have with your health care provider.  
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
## 2014-11-17 ENCOUNTER — Emergency Department: Admit: 2014-11-17 | Disposition: A | Discharge status: Home / Self Care | Payer: Self-pay | Admitting: Internal Medicine

## 2014-12-09 ENCOUNTER — Emergency Department
Admit: 2014-12-09 | Disposition: A | Discharge status: Home / Self Care | Payer: Self-pay | Admitting: Physician Assistant

## 2015-01-27 ENCOUNTER — Inpatient Hospital Stay (HOSPITAL_COMMUNITY): Payer: Medicaid Other

## 2015-01-27 ENCOUNTER — Inpatient Hospital Stay (HOSPITAL_COMMUNITY)
Admission: AD | Admit: 2015-01-27 | Discharge: 2015-01-27 | Disposition: A | Discharge status: Home / Self Care | Payer: Medicaid Other | Source: Ambulatory Visit | Attending: Family Medicine | Admitting: Family Medicine

## 2015-01-27 ENCOUNTER — Encounter (HOSPITAL_COMMUNITY): Payer: Self-pay

## 2015-01-27 DIAGNOSIS — N939 Abnormal uterine and vaginal bleeding, unspecified: Secondary | ICD-10-CM | POA: Insufficient documentation

## 2015-01-27 DIAGNOSIS — K5900 Constipation, unspecified: Secondary | ICD-10-CM | POA: Diagnosis not present

## 2015-01-27 DIAGNOSIS — R102 Pelvic and perineal pain: Secondary | ICD-10-CM | POA: Diagnosis not present

## 2015-01-27 LAB — RAPID URINE DRUG SCREEN, HOSP PERFORMED
Amphetamines: NOT DETECTED
Barbiturates: NOT DETECTED
Benzodiazepines: POSITIVE — AB
Cocaine: NOT DETECTED
Opiates: POSITIVE — AB
Tetrahydrocannabinol: POSITIVE — AB

## 2015-01-27 LAB — CBC
HCT: 44.6 % (ref 36.0–46.0)
Hemoglobin: 15.1 g/dL — ABNORMAL HIGH (ref 12.0–15.0)
MCH: 31.1 pg (ref 26.0–34.0)
MCHC: 33.9 g/dL (ref 30.0–36.0)
MCV: 92 fL (ref 78.0–100.0)
Platelets: 256 10*3/uL (ref 150–400)
RBC: 4.85 MIL/uL (ref 3.87–5.11)
RDW: 13.7 % (ref 11.5–15.5)
WBC: 9.4 10*3/uL (ref 4.0–10.5)

## 2015-01-27 LAB — POCT PREGNANCY, URINE: Preg Test, Ur: NEGATIVE

## 2015-01-27 LAB — URINALYSIS, ROUTINE W REFLEX MICROSCOPIC
Bilirubin Urine: NEGATIVE
Glucose, UA: NEGATIVE mg/dL
Hgb urine dipstick: NEGATIVE
Ketones, ur: 15 mg/dL — AB
Leukocytes, UA: NEGATIVE
Nitrite: NEGATIVE
Protein, ur: NEGATIVE mg/dL
Specific Gravity, Urine: >1.03 — ABNORMAL HIGH (ref 1.005–1.030)
Urobilinogen, UA: 0.2 mg/dL (ref 0.0–1.0)
pH: 6 (ref 5.0–8.0)

## 2015-01-27 LAB — WET PREP, GENITAL
Trich, Wet Prep: NONE SEEN
Yeast Wet Prep HPF POC: NONE SEEN

## 2015-01-27 MED ORDER — KETOROLAC TROMETHAMINE 10 MG PO TABS: 10.0000 mg | ORAL_TABLET | Freq: Four times a day (QID) | ORAL | Status: DC | PRN

## 2015-01-27 MED ORDER — OXYCODONE-ACETAMINOPHEN 5-325 MG PO TABS
2.0000 | ORAL_TABLET | Freq: Once | ORAL | Status: AC
  Administered 2015-01-27: 2 via ORAL
  Filled 2015-01-27: qty 2

## 2015-01-27 MED ORDER — POLYETHYLENE GLYCOL 3350 17 G PO PACK: 17.0000 g | PACK | Freq: Every day | ORAL | Status: DC | PRN

## 2015-01-27 NOTE — MAU Provider Note (Signed)
Chief Complaint: Vaginal Bleeding  First Provider Initiated Contact with Patient 01/27/15 1440     SUBJECTIVE HPI: Jill Mcfarland is a 44 y.o. [phone] female who presents to [facility] reporting vaginal bleeding and low abd pain radiating down bilat thighs x 3 weeks. Normal has monthly cycles lasting 30-4 days w/ mild dysmeenorrhea 2 days at the beginning of cycles.  Also reports occasional bright red rectal bleeding  That seems to correlate with her menstrual period times several years but she has never discussed with her primary care provider.  No other history of abnormal or excessive bleeding.  She rates her pain 9/10 on pain scale. Describes it as sharp, constant. No relationship between pain and position  Changes, urination, eating or defecation.  Has been taking ibuprofen and over-the-counter medication for menstrual cramps with no relief.    patient does not have a gynecologist. She states she has a history of abnormal Pap smear  And colposcopy followed by 2 normal Pap smears.  She  Thinks she is due for Pap smear now.  She denies ever having had an endometrial biopsy  Last intercourse 5 weeks ago. States she is in a long-term, mutually monogamous relationship, but her partner was recently incarcerated.  Past Medical History  Diagnosis Date  . Abscess   . Bartholin cyst   . Depression     History - no current prob per pt.  . Anxiety     History - no current prob per pt.  . Bronchitis    OB History  Gravida Para Term Preterm AB SAB TAB Ectopic Multiple Living  4 3 3  1  1   2     # Outcome Date GA Lbr Len/2nd Weight Sex Delivery Anes PTL Lv  4 Term      Vag-Spont     3 Term      Vag-Spont     2 Term      Vag-Spont     1 TAB              Past Surgical History  Procedure Laterality Date  . Tubal ligation    . Leep    . Incison and drainage      Bartholin cyst  . Wisdom tooth extraction    . Svd      x 3  . Bartholin cyst marsupialization  12/03/2011   Procedure: BARTHOLIN CYST MARSUPIALIZATION;  Surgeon: Mora Bellman, MD;  Location: [facility];  Service: Gynecology;  Laterality: Left;  Bartholin Abcess   History   Social History  . Marital Status: Single    Spouse Name: N/A  . Number of Children: N/A  . Years of Education: N/A   Occupational History  . Not on file.   Social History Main Topics  . Smoking status: Current Every Day Smoker -- 0.50 packs/day for 24 years    Types: Cigarettes  . Smokeless tobacco: Never Used  . Alcohol Use: No  . Drug Use: No  . Sexual Activity: No   Other Topics Concern  . Not on file   Social History Narrative   No current facility-administered medications on file prior to encounter.   Current Outpatient Prescriptions on File Prior to Encounter  Medication Sig Dispense Refill  . ibuprofen (ADVIL,MOTRIN) 600 MG tablet Take 1 tablet (600 mg total) by mouth every 6 (six) hours as needed. 30 tablet 0  . LORazepam (ATIVAN) 1 MG tablet Take 1 tablet by mouth daily as needed.  2  .  oxyCODONE-acetaminophen (PERCOCET/ROXICET) 5-325 MG per tablet Take 1-2 tablets by mouth every 6 (six) hours as needed for severe pain. 6 tablet 0  . acetaminophen (TYLENOL) 500 MG tablet Take 1,000 mg by mouth every 6 (six) hours as needed for mild pain or moderate pain.    Marland Kitchen azithromycin (ZITHROMAX Z-PAK) 250 MG tablet Take 1 tablet (250 mg total) by mouth daily. 2 by mouth day one, then 1 day 2-5 (Patient not taking: Reported on 01/27/2015) 6 tablet 0  . benzonatate (TESSALON) 100 MG capsule Take 1 capsule (100 mg total) by mouth every 8 (eight) hours. (Patient not taking: Reported on 01/27/2015) 21 capsule 0  . fluconazole (DIFLUCAN) 150 MG tablet Take 1 tablet (150 mg total) by mouth daily. (Patient not taking: Reported on 01/27/2015) 1 tablet 0  . HYDROcodone-acetaminophen (NORCO/VICODIN) 5-325 MG per tablet Take 2 tablets by mouth every 4 (four) hours as needed. (Patient not taking: Reported on 01/27/2015) 6 tablet 0  .  methocarbamol (ROBAXIN) 500 MG tablet Take 1 tablet (500 mg total) by mouth 4 (four) times daily. (Patient not taking: Reported on 01/27/2015) 10 tablet 0  . penicillin v potassium (VEETID) 500 MG tablet Take 1 tablet (500 mg total) by mouth 3 (three) times daily. (Patient not taking: Reported on 01/27/2015) 21 tablet 0  . predniSONE (DELTASONE) 10 MG tablet Take 2 tablets (20 mg total) by mouth 2 (two) times daily with a meal. (Patient not taking: Reported on 01/27/2015) 10 tablet 0  . sulfamethoxazole-trimethoprim (SEPTRA DS) 800-160 MG per tablet Take 1 tablet by mouth 2 (two) times daily. (Patient not taking: Reported on 01/27/2015) 28 tablet 0   Allergies  Allergen Reactions  . Tramadol Other (See Comments)    Causes inside of mouth to become raw  . Vicodin [Hydrocodone-Acetaminophen] Itching    Review of Systems  Constitutional: Negative for fever and chills.  Gastrointestinal: Positive for nausea, abdominal pain and blood in stool. Negative for vomiting, diarrhea, constipation ( lastt bowel movement today, normal) and melena.  Genitourinary: Positive for frequency. Negative for dysuria, urgency, hematuria and flank pain.        Positive for vaginal bleeding. Negative for vaginal discharge.  Neurological: Negative for weakness.    OBJECTIVE Blood pressure 109/81, pulse 97, temperature 98 F (36.7 C), temperature source Oral, resp. rate 18, height 5\' 2"  (1.575 m), weight 140 lb (63.504 kg), last menstrual period 01/21/2015. GENERAL: Well-developed, well-nourished female in moderate distress.  HEART: normal rate RESP: normal effort GI: Abdomen soft,  Mild , diffuse tenderness throughout entire low abdomen.  hyperactive bowel sounds 4. MS: Nontender, no edema NEURO: Alert and oriented SPECULUM EXAM:  Bilateral labia majora and mons  Have what appears to be multiple areas of folliculitis in various stages of healing throughout  Radiates from 2 mm-5 mm.  Some are mildly tender and  erythematous.  Others are non tender and appear to be healing. There are no vesicular lesions.  Scant amount of dark red blood noted, cervix clean.  Cervix non-friable. No cervical polyps. BIMANUAL: cervix  closed; uterus normal size,  Right adnexa tender with questionable 2-3 cm tender mass. left  Adnexa non tender. No mass. Questionable cervical motion tenderness , however patient generally intolerant with entire physical exam. Rectal: small external hemorrhoids. No Active rectal bleeding.   LAB RESULTS Results for orders placed or performed during the hospital encounter of 01/27/15 (from the past 24 hour(s))  Pregnancy, urine POC     Status: None   Collection  Time: 01/27/15  2:03 PM  Result Value Ref Range   Preg Test, Ur NEGATIVE NEGATIVE  Urinalysis, Routine w reflex microscopic (not at [facility])     Status: Abnormal   Collection Time: 01/27/15  2:05 PM  Result Value Ref Range   Color, Urine YELLOW YELLOW   APPearance CLEAR CLEAR   Specific Gravity, Urine >1.030 (H) 1.005 - 1.030   pH 6.0 5.0 - 8.0   Glucose, UA NEGATIVE NEGATIVE mg/dL   Hgb urine dipstick NEGATIVE NEGATIVE   Bilirubin Urine NEGATIVE NEGATIVE   Ketones, ur 15 (A) NEGATIVE mg/dL   Protein, ur NEGATIVE NEGATIVE mg/dL   Urobilinogen, UA 0.2 0.0 - 1.0 mg/dL   Nitrite NEGATIVE NEGATIVE   Leukocytes, UA NEGATIVE NEGATIVE  Urine rapid drug screen (hosp performed)     Status: Abnormal   Collection Time: 01/27/15  2:06 PM  Result Value Ref Range   Opiates POSITIVE (A) NONE DETECTED   Cocaine NONE DETECTED NONE DETECTED   Benzodiazepines POSITIVE (A) NONE DETECTED   Amphetamines NONE DETECTED NONE DETECTED   Tetrahydrocannabinol POSITIVE (A) NONE DETECTED   Barbiturates NONE DETECTED NONE DETECTED  Wet prep, genital     Status: Abnormal   Collection Time: 01/27/15  2:58 PM  Result Value Ref Range   Yeast Wet Prep HPF POC NONE SEEN NONE SEEN   Trich, Wet Prep NONE SEEN NONE SEEN   Clue Cells Wet Prep HPF POC FEW (A) NONE  SEEN   WBC, Wet Prep HPF POC FEW (A) NONE SEEN  CBC     Status: Abnormal   Collection Time: 01/27/15  3:01 PM  Result Value Ref Range   WBC 9.4 4.0 - 10.5 K/uL   RBC 4.85 3.87 - 5.11 MIL/uL   Hemoglobin 15.1 (H) 12.0 - 15.0 g/dL   HCT 44.6 36.0 - 46.0 %   MCV 92.0 78.0 - 100.0 fL   MCH 31.1 26.0 - 34.0 pg   MCHC 33.9 30.0 - 36.0 g/dL   RDW 13.7 11.5 - 15.5 %   Platelets 256 150 - 400 K/uL    IMAGING US Transvaginal Non-ob  01/27/2015   CLINICAL DATA:  44 year old female with pelvic pain and abnormal uterine bleeding. History of tubal ligation.  EXAM: TRANSABDOMINAL AND TRANSVAGINAL ULTRASOUND OF PELVIS  TECHNIQUE: Both transabdominal and transvaginal ultrasound examinations of the pelvis were performed. Transabdominal technique was performed for global imaging of the pelvis including uterus, ovaries, adnexal regions, and pelvic cul-de-sac. It was necessary to proceed with endovaginal exam following the transabdominal exam to visualize the ovaries and endometrium.  COMPARISON:  08/21/2009 ultrasound  FINDINGS: Uterus  Measurements: Anteverted measuring 6.5 x 3.8 x 4.5 cm. No fibroids or other mass visualized.  Endometrium  Thickness: 7 mm.  No focal abnormality visualized.  Right ovary  Measurements: 2 x 1.8 x 1.5 cm. Normal appearance/no adnexal mass.  Left ovary  Measurements: 2.4 x 1.3 x 1.8 cm. Normal appearance/no adnexal mass.  Other findings  No free fluid.  IMPRESSION: Normal pelvic ultrasound.   Electronically Signed   By: Margarette Canada M.D.   On: 01/27/2015 15:45   US Pelvis Complete  01/27/2015   CLINICAL DATA:  44 year old female with pelvic pain and abnormal uterine bleeding. History of tubal ligation.  EXAM: TRANSABDOMINAL AND TRANSVAGINAL ULTRASOUND OF PELVIS  TECHNIQUE: Both transabdominal and transvaginal ultrasound examinations of the pelvis were performed. Transabdominal technique was performed for global imaging of the pelvis including uterus, ovaries, adnexal regions, and  pelvic cul-de-sac. It  was necessary to proceed with endovaginal exam following the transabdominal exam to visualize the ovaries and endometrium.  COMPARISON:  08/21/2009 ultrasound  FINDINGS: Uterus  Measurements: Anteverted measuring 6.5 x 3.8 x 4.5 cm. No fibroids or other mass visualized.  Endometrium  Thickness: 7 mm.  No focal abnormality visualized.  Right ovary  Measurements: 2 x 1.8 x 1.5 cm. Normal appearance/no adnexal mass.  Left ovary  Measurements: 2.4 x 1.3 x 1.8 cm. Normal appearance/no adnexal mass.  Other findings  No free fluid.  IMPRESSION: Normal pelvic ultrasound.   Electronically Signed   By: Margarette Canada M.D.   On: 01/27/2015 15:45   Per sonographer transabdominal pelvic ultrasound was severely limited by large stool burden.  MAU COURSE UA, UPT, CBC, [place], Wet Prep, GC/Chlamydia,  Percocet (given due to recent ibuprofen dose and Ultram allergy).  No emergent condition apparent. Suspect pain is caused by constipation and dysmenorrhea. Pain resolved w/ percocet.   ASSESSMENT 1. Constipation, unspecified constipation type   2. Pelvic pain in female   3. Abnormal uterine bleeding     PLAN Discharge home in stable condition. GC/Chlamydia cultures. Pending. Pt asking for percocet, but will not Rx because it does not appear to be indicated for her condition and because it may worsen constipation. Also pt denies taking any other pain meds for this pain, but UDS pos opiates.  Increase fluids and fiber. Warm compress to abd.      Follow-up Information    Follow up with [facility].   Why:  for rectal bleeding and primary care.    Contact information:   [place] [address] [phone]       Follow up with  gynecologist of your choice.   Why:   for further evaluation of your abnormal uterine bleeding.       Medication List    STOP taking these medications        azithromycin 250 MG tablet  Commonly known as:  ZITHROMAX Z-PAK      benzonatate 100 MG capsule  Commonly known as:  TESSALON     fluconazole 150 MG tablet  Commonly known as:  DIFLUCAN     HYDROcodone-acetaminophen 5-325 MG per tablet  Commonly known as:  NORCO/VICODIN     ibuprofen 600 MG tablet  Commonly known as:  ADVIL,MOTRIN     methocarbamol 500 MG tablet  Commonly known as:  ROBAXIN     oxyCODONE-acetaminophen 5-325 MG per tablet  Commonly known as:  PERCOCET/ROXICET     penicillin v potassium 500 MG tablet  Commonly known as:  VEETID     predniSONE 10 MG tablet  Commonly known as:  DELTASONE     sulfamethoxazole-trimethoprim 800-160 MG per tablet  Commonly known as:  SEPTRA DS      TAKE these medications        acetaminophen 500 MG tablet  Commonly known as:  TYLENOL  Take 1,000 mg by mouth every 6 (six) hours as needed for mild pain or moderate pain.     ketorolac 10 MG tablet  Commonly known as:  TORADOL  Take 1 tablet (10 mg total) by mouth every 6 (six) hours as needed.     LORazepam 1 MG tablet  Commonly known as:  ATIVAN  Take 1 tablet by mouth daily as needed.     polyethylene glycol packet  Commonly known as:  MIRALAX / GLYCOLAX  Take 17 g by mouth daily as needed.  [place], [place] 01/27/2015  4:42 PM

## 2015-01-27 NOTE — MAU Note (Signed)
Pt states has been on menstrual cycle for past three weeks however is concerned she has had rectal bleeding for years, only when menstruating. Anxious rectal bleeding is something serious. Bleeding began again this Monday after stopping for one week. Has bleeding 3 weeks prior.

## 2015-01-27 NOTE — Discharge Instructions (Signed)
Abnormal Uterine Bleeding Abnormal uterine bleeding can affect women at various stages in life, including teenagers, women in their reproductive years, pregnant women, and women who have reached menopause. Several kinds of uterine bleeding are considered abnormal, including:  Bleeding or spotting between periods.   Bleeding after sexual intercourse.   Bleeding that is heavier or more than normal.   Periods that last longer than usual.  Bleeding after menopause.  Many cases of abnormal uterine bleeding are minor and simple to treat, while others are more serious. Any type of abnormal bleeding should be evaluated by your health care provider. Treatment will depend on the cause of the bleeding. HOME CARE INSTRUCTIONS Monitor your condition for any changes. The following actions may help to alleviate any discomfort you are experiencing:  Avoid the use of tampons and douches as directed by your health care provider.  Change your pads frequently. You should get regular pelvic exams and Pap tests. Keep all follow-up appointments for diagnostic tests as directed by your health care provider.  SEEK MEDICAL CARE IF:   Your bleeding lasts more than 1 week.   You feel dizzy at times.  SEEK IMMEDIATE MEDICAL CARE IF:   You pass out.   You are changing pads every 15 to 30 minutes.   You have abdominal pain.  You have a fever.   You become sweaty or weak.   You are passing large blood clots from the vagina.   You start to feel nauseous and vomit. MAKE SURE YOU:   Understand these instructions.  Will watch your condition.  Will get help right away if you are not doing well or get worse. Document Released: 08/03/2005 Document Revised: 08/08/2013 Document Reviewed: 03/02/2013 [facility] Patient Information 2015 [place], [place]. This information is not intended to replace advice given to you by your health care provider. Make sure you discuss any questions you have with your  health care provider.  Constipation Constipation is when a person has fewer than three bowel movements a week, has difficulty having a bowel movement, or has stools that are dry, hard, or larger than normal. As people grow older, constipation is more common. If you try to fix constipation with medicines that make you have a bowel movement (laxatives), the problem may get worse. Long-term laxative use may cause the muscles of the colon to become weak. A low-fiber diet, not taking in enough fluids, and taking certain medicines may make constipation worse.  CAUSES   Certain medicines, such as antidepressants, pain medicine, iron supplements, antacids, and water pills.   Certain diseases, such as diabetes, irritable bowel syndrome (IBS), thyroid disease, or depression.   Not drinking enough water.   Not eating enough fiber-rich foods.   Stress or travel.   Lack of physical activity or exercise.   Ignoring the urge to have a bowel movement.   Using laxatives too much.  SIGNS AND SYMPTOMS   Having fewer than three bowel movements a week.   Straining to have a bowel movement.   Having stools that are hard, dry, or larger than normal.   Feeling full or bloated.   Pain in the lower abdomen.   Not feeling relief after having a bowel movement.  DIAGNOSIS  Your health care provider will take a medical history and perform a physical exam. Further testing may be done for severe constipation. Some tests may include:  A barium enema X-ray to examine your rectum, colon, and, sometimes, your small intestine.   A sigmoidoscopy to  examine your lower colon.   A colonoscopy to examine your entire colon. TREATMENT  Treatment will depend on the severity of your constipation and what is causing it. Some dietary treatments include drinking more fluids and eating more fiber-rich foods. Lifestyle treatments may include regular exercise. If these diet and lifestyle recommendations do  not help, your health care provider may recommend taking over-the-counter laxative medicines to help you have bowel movements. Prescription medicines may be prescribed if over-the-counter medicines do not work.  HOME CARE INSTRUCTIONS   Eat foods that have a lot of fiber, such as fruits, vegetables, whole grains, and beans.  Limit foods high in fat and processed sugars, such as french fries, hamburgers, cookies, candies, and soda.   A fiber supplement may be added to your diet if you cannot get enough fiber from foods.   Drink enough fluids to keep your urine clear or pale yellow.   Exercise regularly or as directed by your health care provider.   Go to the restroom when you have the urge to go. Do not hold it.   Only take over-the-counter or prescription medicines as directed by your health care provider. Do not take other medicines for constipation without talking to your health care provider first.  [place] IF:   You have bright red blood in your stool.   Your constipation lasts for more than 4 days or gets worse.   You have abdominal or rectal pain.   You have thin, pencil-like stools.   You have unexplained weight loss. MAKE SURE YOU:   Understand these instructions.  Will watch your condition.  Will get help right away if you are not doing well or get worse. Document Released: 05/01/2004 Document Revised: 08/08/2013 Document Reviewed: 05/15/2013 [facility] Patient Information 2015 [place], [place]. This information is not intended to replace advice given to you by your health care provider. Make sure you discuss any questions you have with your health care provider.

## 2015-01-28 LAB — GC/CHLAMYDIA PROBE AMP (~~LOC~~) NOT AT ARMC
Chlamydia: NEGATIVE
Neisseria Gonorrhea: NEGATIVE

## 2015-01-28 LAB — HIV ANTIBODY (ROUTINE TESTING W REFLEX): HIV Screen 4th Generation wRfx: NONREACTIVE

## 2015-03-22 ENCOUNTER — Inpatient Hospital Stay (HOSPITAL_COMMUNITY)
Admission: EM | Admit: 2015-03-22 | Discharge: 2015-03-22 | Disposition: A | Discharge status: Home / Self Care | Payer: Medicaid Other | Source: Ambulatory Visit | Attending: Obstetrics & Gynecology | Admitting: Obstetrics & Gynecology

## 2015-03-22 ENCOUNTER — Encounter (HOSPITAL_COMMUNITY): Payer: Self-pay | Admitting: *Deleted

## 2015-03-22 DIAGNOSIS — Z886 Allergy status to analgesic agent status: Secondary | ICD-10-CM | POA: Insufficient documentation

## 2015-03-22 DIAGNOSIS — L02215 Cutaneous abscess of perineum: Secondary | ICD-10-CM | POA: Diagnosis not present

## 2015-03-22 DIAGNOSIS — Z885 Allergy status to narcotic agent status: Secondary | ICD-10-CM | POA: Insufficient documentation

## 2015-03-22 DIAGNOSIS — F17210 Nicotine dependence, cigarettes, uncomplicated: Secondary | ICD-10-CM | POA: Insufficient documentation

## 2015-03-22 DIAGNOSIS — L02225 Furuncle of perineum: Secondary | ICD-10-CM | POA: Insufficient documentation

## 2015-03-22 LAB — URINE MICROSCOPIC-ADD ON

## 2015-03-22 LAB — URINALYSIS, ROUTINE W REFLEX MICROSCOPIC
Bilirubin Urine: NEGATIVE
Glucose, UA: NEGATIVE mg/dL
Hgb urine dipstick: NEGATIVE
Ketones, ur: NEGATIVE mg/dL
Nitrite: NEGATIVE
Protein, ur: NEGATIVE mg/dL
Specific Gravity, Urine: 1.01 (ref 1.005–1.030)
Urobilinogen, UA: 0.2 mg/dL (ref 0.0–1.0)
pH: 5.5 (ref 5.0–8.0)

## 2015-03-22 LAB — POCT PREGNANCY, URINE: Preg Test, Ur: NEGATIVE

## 2015-03-22 MED ORDER — LIDOCAINE HCL (PF) 1 % IJ SOLN
5.0000 mL | Freq: Once | INTRAMUSCULAR | Status: AC
  Administered 2015-03-22: 5 mL
  Filled 2015-03-22: qty 30

## 2015-03-22 MED ORDER — OXYCODONE-ACETAMINOPHEN 5-325 MG PO TABS: 1.0000 | ORAL_TABLET | Freq: Four times a day (QID) | ORAL | Status: DC | PRN

## 2015-03-22 MED ORDER — SULFAMETHOXAZOLE-TRIMETHOPRIM 800-160 MG PO TABS: 1.0000 | ORAL_TABLET | Freq: Two times a day (BID) | ORAL | Status: DC

## 2015-03-22 MED ORDER — OXYCODONE-ACETAMINOPHEN 5-325 MG PO TABS
2.0000 | ORAL_TABLET | Freq: Once | ORAL | Status: AC
  Administered 2015-03-22: 2 via ORAL
  Filled 2015-03-22: qty 2

## 2015-03-22 MED ORDER — ONDANSETRON 8 MG PO TBDP
8.0000 mg | ORAL_TABLET | Freq: Once | ORAL | Status: AC
  Administered 2015-03-22: 8 mg via ORAL
  Filled 2015-03-22: qty 1

## 2015-03-22 MED ORDER — LIDOCAINE HCL 2 % EX GEL
1.0000 "application " | Freq: Once | CUTANEOUS | Status: AC
  Administered 2015-03-22: 13:00:00 via TOPICAL
  Filled 2015-03-22: qty 5

## 2015-03-22 NOTE — MAU Provider Note (Signed)
History     CSN: [id number]  Arrival date and time: 03/22/15 1131   First Provider Initiated Contact with Patient 03/22/15 1333      Chief Complaint  Patient presents with  . Bartholin's Cyst   This is a 44 y.o. female who presents with c/o "boil" on her left perineum/buttocks.  States she knows it is not a Bartholins, because she had marsupialization surgery on a Bartholins abscess on that same side in 2013.  She states this started about 5 days ago and has gotten worse. She has been using warm compresses to try to get it to drain at home. Pain is tenderness to touch, and does not radiate. Denies bleeding.   Her history is remarkable for frequent skin abscesses and furuncles, on different parts of her body.  Has had multiple I&Ds.    Vaginal Pain The patient's pertinent negatives include no genital itching, genital lesions, genital odor or vaginal bleeding. This is a new problem. The current episode started 1 to 4 weeks ago. The problem has been gradually worsening. The pain is severe. The problem affects the left side. She is not pregnant. Pertinent negatives include no abdominal pain, chills, constipation, diarrhea, fever, nausea or vomiting. The symptoms are aggravated by tactile pressure. Treatments tried: warm compresses. The treatment provided no relief.   RN Note: Pt presents to MAU with complaints of a bartholin's cyst that started 5 days ago her left labial area          OB History    Gravida Para Term Preterm AB TAB SAB Ectopic Multiple Living   4 3 3  1 1    2       Past Medical History  Diagnosis Date  . Abscess   . Bartholin cyst   . Depression     History - no current prob per pt.  . Anxiety     History - no current prob per pt.  . Bronchitis     Past Surgical History  Procedure Laterality Date  . Tubal ligation    . Leep    . Incison and drainage      Bartholin cyst  . Wisdom tooth extraction    . Svd      x 3  . Bartholin cyst marsupialization   12/03/2011    Procedure: BARTHOLIN CYST MARSUPIALIZATION;  Surgeon: Mora Bellman, MD;  Location: [facility];  Service: Gynecology;  Laterality: Left;  Bartholin Abcess    Family History  Problem Relation Age of Onset  . Cancer Mother   . Asthma Son     History  Substance Use Topics  . Smoking status: Current Every Day Smoker -- 0.50 packs/day for 24 years    Types: Cigarettes  . Smokeless tobacco: Never Used  . Alcohol Use: No    Allergies:  Allergies  Allergen Reactions  . Tramadol Other (See Comments)    Causes inside of mouth to become raw  . Vicodin [Hydrocodone-Acetaminophen] Itching    Prescriptions prior to admission  Medication Sig Dispense Refill Last Dose  . acetaminophen (TYLENOL) 500 MG tablet Take 1,000 mg by mouth every 6 (six) hours as needed for mild pain or moderate pain.   Past Week at Unknown time  . Aspirin-Caffeine (BC FAST PAIN RELIEF PO) Take 1 packet by mouth daily as needed (headache).   03/22/2015 at Unknown time  . polyethylene glycol (MIRALAX / GLYCOLAX) packet Take 17 g by mouth daily as needed. 14 each 0 03/21/2015 at Unknown time  .  ketorolac (TORADOL) 10 MG tablet Take 1 tablet (10 mg total) by mouth every 6 (six) hours as needed. (Patient not taking: Reported on 03/22/2015) 20 tablet 0    Medical, Surgical, Family and Social histories reviewed and are listed above.  Medications and allergies reviewed.   Review of Systems  Constitutional: Negative for fever, chills and malaise/fatigue.  Gastrointestinal: Negative for nausea, vomiting, abdominal pain, diarrhea and constipation.  Genitourinary: Positive for vaginal pain.       Boil on buttocks near vagina  Other systems negative  Physical Exam   Blood pressure 147/95, pulse 98, temperature 98.2 F (36.8 C), resp. rate 18, last menstrual period 02/19/2015.  Physical Exam  Constitutional: She is oriented to person, place, and time. She appears well-developed and well-nourished. No distress.  HENT:    Head: Normocephalic.  Cardiovascular: Normal rate.   Respiratory: Effort normal. No respiratory distress.  GI: Soft. She exhibits no distension. There is no tenderness. There is no rebound and no guarding.  Genitourinary: Vagina normal.    No vaginal discharge found.  Introitus is scarred with evidence of multiple furuncles (healed) There is a healed Marsupialized Bartholins on left There is a 2cm furuncle on left buttock, about 2cm from vagina.  There is a large whitehead. Area around edge is erethematous and indurated. Center is fluctuant.   Musculoskeletal: Normal range of motion.  Neurological: She is alert and oriented to person, place, and time.  Skin: Skin is warm and dry.  Psychiatric: She has a normal mood and affect.    MAU Course  Procedures  MDM  I&D of Furuncle Enlarged abscess visualized near left side of vagina.  Written informed consent was obtained.  Discussed complications and possible outcomes of procedure including recurrence of cyst, scarring leading to infecton, bleeding, dyspareunia, distortion of anatomy.  Patient was examined in the dorsal lithotomy position and mass was identified.  The area was prepped with Iodine and draped in a sterile manner. 1% Lidocaine (3 ml) was then used to infiltrate area on top of the cyst.  A 2 mm incision was made using Adson forceps, since furuncle was already almost ready to drain.  TIssue at head was very thin.  Upon application of pressure, a moderate amount of bloody purulent drainage was expressed through the incision. A hemostat was used to break up loculations.  Patient tolerated the procedure well. - Bactrim DS bid x 7 days for treatment - Recommended Sitz baths bid and Motrin and Percocet was given  prn pain.   She was told to call to be examined if she experiences increasing swelling, pain, vaginal discharge, or fever.  - She was instructed to wear a peripad to absorb discharge, and to maintain pelvic rest until healed.    Assessment and Plan  A:  Perineal abscess / Furuncle  P:  Discharge home       Rx Septra DS x 7days       Rx Percocet for pain       Warm soaks       Followup in clinic as needed for GYN care  [facility] 03/22/2015, 1:59 PM

## 2015-03-22 NOTE — Discharge Instructions (Signed)

## 2015-03-22 NOTE — MAU Note (Signed)
Pt presents to MAU with complaints of a bartholin's cyst that started 5 days ago her left labial area

## 2015-09-14 ENCOUNTER — Emergency Department (HOSPITAL_COMMUNITY)
Admission: EM | Admit: 2015-09-14 | Discharge: 2015-09-14 | Discharge status: Against Medical Advice | Payer: Medicaid Other | Attending: Emergency Medicine | Admitting: Emergency Medicine

## 2015-09-14 ENCOUNTER — Encounter (HOSPITAL_COMMUNITY): Payer: Self-pay | Admitting: *Deleted

## 2015-09-14 DIAGNOSIS — R202 Paresthesia of skin: Secondary | ICD-10-CM | POA: Diagnosis not present

## 2015-09-14 DIAGNOSIS — M79601 Pain in right arm: Secondary | ICD-10-CM | POA: Diagnosis not present

## 2015-09-14 DIAGNOSIS — R200 Anesthesia of skin: Secondary | ICD-10-CM | POA: Insufficient documentation

## 2015-09-14 DIAGNOSIS — F17210 Nicotine dependence, cigarettes, uncomplicated: Secondary | ICD-10-CM | POA: Diagnosis not present

## 2015-09-14 NOTE — ED Notes (Signed)
Patient c/o right arm pain from elbow down to hand and fingers x1 week.  Patient denies trauma to arm.  Patient states pain is worse with flexion and with extension, it radiates into right hand.  Patient states she has been told she has carpal tunnel in right hand and endorses numbness/tingling to right hand.  Patient describes pain as "sharp".  Patient identifies "knot" at proximal end of radius which is not particularly prominent on exam.  Patient has full ROM RUE with +2 pulses, light touch sensation intact.  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  Patient denies N/V/D and fever.

## 2015-09-14 NOTE — ED Notes (Signed)
Pt states "There is nothing they can do for arm. I don't want to waste any money." Pt decided not to be seen by ED provider.

## 2015-09-15 ENCOUNTER — Emergency Department (HOSPITAL_COMMUNITY)
Admission: EM | Admit: 2015-09-15 | Discharge: 2015-09-15 | Disposition: A | Discharge status: Home / Self Care | Payer: Medicaid Other | Attending: Emergency Medicine | Admitting: Emergency Medicine

## 2015-09-15 ENCOUNTER — Encounter (HOSPITAL_COMMUNITY): Payer: Self-pay | Admitting: Vascular Surgery

## 2015-09-15 DIAGNOSIS — L0232 Furuncle of buttock: Secondary | ICD-10-CM | POA: Insufficient documentation

## 2015-09-15 DIAGNOSIS — Z7982 Long term (current) use of aspirin: Secondary | ICD-10-CM | POA: Diagnosis not present

## 2015-09-15 DIAGNOSIS — Z8709 Personal history of other diseases of the respiratory system: Secondary | ICD-10-CM | POA: Diagnosis not present

## 2015-09-15 DIAGNOSIS — Z8659 Personal history of other mental and behavioral disorders: Secondary | ICD-10-CM | POA: Diagnosis not present

## 2015-09-15 DIAGNOSIS — Z792 Long term (current) use of antibiotics: Secondary | ICD-10-CM | POA: Insufficient documentation

## 2015-09-15 DIAGNOSIS — F17210 Nicotine dependence, cigarettes, uncomplicated: Secondary | ICD-10-CM | POA: Diagnosis not present

## 2015-09-15 DIAGNOSIS — Z8742 Personal history of other diseases of the female genital tract: Secondary | ICD-10-CM | POA: Diagnosis not present

## 2015-09-15 DIAGNOSIS — M778 Other enthesopathies, not elsewhere classified: Secondary | ICD-10-CM | POA: Diagnosis not present

## 2015-09-15 DIAGNOSIS — L0231 Cutaneous abscess of buttock: Secondary | ICD-10-CM | POA: Diagnosis present

## 2015-09-15 DIAGNOSIS — Y9389 Activity, other specified: Secondary | ICD-10-CM | POA: Insufficient documentation

## 2015-09-15 MED ORDER — NAPROXEN 500 MG PO TABS: 500.0000 mg | ORAL_TABLET | Freq: Two times a day (BID) | ORAL | Status: DC

## 2015-09-15 MED ORDER — IBUPROFEN 400 MG PO TABS: 600.0000 mg | ORAL_TABLET | Freq: Once | ORAL | Status: DC

## 2015-09-15 MED ORDER — DOXYCYCLINE HYCLATE 100 MG PO CAPS: 100.0000 mg | ORAL_CAPSULE | Freq: Two times a day (BID) | ORAL | Status: DC

## 2015-09-15 NOTE — ED Provider Notes (Signed)
CSN: [id number]     Arrival date & time 09/15/15  1534 History   First MD Initiated Contact with Patient 09/15/15 1544     Chief Complaint  Patient presents with  . Abscess  . Arm Pain     (Consider location/radiation/quality/duration/timing/severity/associated sxs/prior Treatment) HPI Jill Mcfarland is a 45 y.o. female presents to emergency department with 2 separate complaints. Patient's complaining of an abscess to the left buttock and pain in the right arm. She states that she has history of hydradenitis and reports frequent abscesses. She states she developed this abscess several days ago, it is on the left buttock. No drainage. No fever or chills. Hx of the same. States sometimes gets antibiotics, sometimes they need to be opened. Up. Pt is also complaining of pain in right arm. States pain for several weeks. 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 She states she feels that her elbow is swollen. She states that she has pain with flexing and extending in the elbow. She denies any limited range of motion. She states pain radiates into right hand. She denies any injuries. No fever or chills.=  Past Medical History  Diagnosis Date  . Abscess   . Bartholin cyst   . Depression     History - no current prob per pt.  . Anxiety     History - no current prob per pt.  . Bronchitis    Past Surgical History  Procedure Laterality Date  . Tubal ligation    . Leep    . Incison and drainage      Bartholin cyst  . Wisdom tooth extraction    . Svd      x 3  . Bartholin cyst marsupialization  12/03/2011    Procedure: BARTHOLIN CYST MARSUPIALIZATION;  Surgeon: Mora Bellman, MD;  Location: [facility];  Service: Gynecology;  Laterality: Left;  Bartholin Abcess   Family History  Problem Relation Age of Onset  . Cancer Mother   . Asthma Son    Social History  Substance Use Topics  . Smoking  status: Current Every Day Smoker -- 0.50 packs/day for 24 years    Types: Cigarettes  . Smokeless tobacco: Never Used  . Alcohol Use: No   OB History    Gravida Para Term Preterm AB TAB SAB Ectopic Multiple Living   4 3 3  1 1    2      Review of Systems  Constitutional: Negative for fever and chills.  Respiratory: Negative for cough, chest tightness and shortness of breath.   Cardiovascular: Negative for chest pain, palpitations and leg swelling.  Gastrointestinal: Negative for nausea, vomiting, abdominal pain and diarrhea.  Genitourinary: Negative for dysuria, flank pain and pelvic pain.  Musculoskeletal: Positive for myalgias and arthralgias. Negative for neck pain and neck stiffness.  Skin: Positive for wound. Negative for rash.  Neurological: Negative for dizziness, weakness and headaches.  All other systems reviewed and are negative.     Allergies  Tramadol and Vicodin  Home Medications   Prior to Admission medications   Medication Sig Start Date End Date Taking? Authorizing Provider  acetaminophen (TYLENOL) 500 MG tablet Take 1,000 mg by mouth every 6 (six) hours as needed for mild pain or moderate pain.    Historical Provider, MD  Aspirin-Caffeine (BC FAST PAIN RELIEF PO) Take 1 packet by mouth daily as needed (headache).  Historical Provider, MD  ketorolac (TORADOL) 10 MG tablet Take 1 tablet (10 mg total) by mouth every 6 (six) hours as needed. Patient not taking: Reported on 03/22/2015 01/27/15   Manya Silvas, CNM  oxyCODONE-acetaminophen (PERCOCET/ROXICET) 5-325 MG per tablet Take 1-2 tablets by mouth every 6 (six) hours as needed. 03/22/15   Seabron Spates, CNM  polyethylene glycol (MIRALAX / GLYCOLAX) packet Take 17 g by mouth daily as needed. 01/27/15   Manya Silvas, CNM  sulfamethoxazole-trimethoprim (BACTRIM DS,SEPTRA DS) 800-160 MG per tablet Take 1 tablet by mouth 2 (two) times daily. 03/22/15   Seabron Spates, CNM   BP 128/75 mmHg  Pulse 110  Temp(Src)  98.2 F (36.8 C) (Oral)  Resp 16  SpO2 95%  LMP 08/27/2015 Physical Exam  Constitutional: She is oriented to person, place, and time. She appears well-developed and well-nourished. No distress.  HENT:  Head: Normocephalic.  Eyes: Conjunctivae are normal.  Neck: Neck supple.  Cardiovascular: Normal rate, regular rhythm and normal heart sounds.   Pulmonary/Chest: Effort normal and breath sounds normal. No respiratory distress. She has no wheezes. She has no rales.  Abdominal: Soft. Bowel sounds are normal. She exhibits no distension. There is no tenderness. There is no rebound.  Musculoskeletal: She exhibits no edema.  Tenderness to palpation over medial and lateral epicondyles of the right elbow. No obvious swelling, no warmth to touch, no deformity. Pain with flexion and extension of the elbow. No pain with pronation or supination. Normal grip strength and normal hand exam. Normal shoulder.  Neurological: She is alert and oriented to person, place, and time.  Skin: Skin is warm and dry.  Small 1cm abscess to the left buttock. No drainage. No surrounding cellulitis.  Psychiatric: She has a normal mood and affect. Her behavior is normal.  Nursing note and vitals reviewed.   ED Course  Procedures (including critical care time) Labs Review Labs Reviewed - No data to display  Imaging Review No results found. I have personally reviewed and evaluated these images and lab results as part of my medical decision-making.   EKG Interpretation None      MDM   Final diagnoses:  Right elbow tendonitis  Furuncle of buttock    patient with 2 complaints. She is complaining of right arm pain. No injuries. Pain is mainly located over the right elbow. She is tender over both medial and lateral epicondyles of the elbow. She has pain with flexion and extension of the elbow, however has full range of motion of the elbow. There is no obvious swelling. There is no evidence of infection. Her forearm  is not tender, and no pain with pronation or supination of the forearm. Wrist and hand normal. Distal radial pulses intact. I suspect may be arthritis versus tendinitis. Will discharge home with NSAIDs.  Patient also has very small, approximately 1 cm pustule to the left buttock. There is no surrounding cellulitis. There is no induration. Do not think he needs incision and drainage. Patient is requesting antibiotics. Will start doxycycline. Advised warm compresses and soaks.Follow-up primary care doctor.  Upon me discharging patient, patient yelled out from the room across the hallway, "what about my pain?' i explained to her thatI ordered her some ibuprofen here and she will have naproxen prescription for pain at home. Patient took out some pills from her pocket, and stated " I already have some ibuprofen here and my pocket side so i dont need that, I need something stronger for pain." i reviewed  patient's chart. Patient requesting narcotic medications during H visit. She has history of having positive drug screen for opiates when she was not prescribed any. I do not think any of her complaints today required treatment with opiates. I have advised her to follow-up with orthopedics regarding her arm pain and NSAIDs are appropriate for treatment. I also given her Ace wrap in the sling for support. Pt did become upset. She however did accept prescriptions and discharge papers and left emergency department.  Filed Vitals:   09/15/15 1548  BP: 128/75  Pulse: 110  Temp: 98.2 F (36.8 C)  TempSrc: Oral  Resp: 16  SpO2: 95%     Jeannett Senior, PA-C 09/15/15 [place], DO 09/15/15 2019
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
## 2015-09-15 NOTE — Discharge Instructions (Signed)
Warm compresses and soaks for your abscess. Take doxycycline as prescribed until all gone for the infection. Take naproxen for your pain. Avoid any strenuous activity with the arm. Follow-up with orthopedics doctor for recheck. Elevation.    Abscess An abscess is an infected area that contains a collection of pus and debris.It can occur in almost any part of the body. An abscess is also known as a furuncle or boil. CAUSES  An abscess occurs when tissue gets infected. This can occur from blockage of oil or sweat glands, infection of hair follicles, or a minor injury to the skin. As the body tries to fight the infection, pus collects in the area and creates pressure under the skin. This pressure causes pain. People with weakened immune systems have difficulty fighting infections and get certain abscesses more often.  SYMPTOMS Usually an abscess develops on the skin and becomes a painful mass that is red, warm, and tender. If the abscess forms under the skin, you may feel a moveable soft area under the skin. Some abscesses break open (rupture) on their own, but most will continue to get worse without care. The infection can spread deeper into the body and eventually into the bloodstream, causing you to feel ill.  DIAGNOSIS  Your caregiver will take your medical history and perform a physical exam. A sample of fluid may also be taken from the abscess to determine what is causing your infection. TREATMENT  Your caregiver may prescribe antibiotic medicines to fight the infection. However, taking antibiotics alone usually does not cure an abscess. Your caregiver may need to make a small cut (incision) in the abscess to drain the pus. In some cases, gauze is packed into the abscess to reduce pain and to continue draining the area. HOME CARE INSTRUCTIONS   Only take over-the-counter or prescription medicines for pain, discomfort, or fever as directed by your caregiver.  If you were prescribed antibiotics,  take them as directed. Finish them even if you start to feel better.  If gauze is used, follow your caregiver's directions for changing the gauze.  To avoid spreading the infection:  Keep your draining abscess covered with a bandage.  Wash your hands well.  Do not share personal care items, towels, or whirlpools with others.  Avoid skin contact with others.  Keep your skin and clothes clean around the abscess.  Keep all follow-up appointments as directed by your caregiver. SEEK MEDICAL CARE IF:   You have increased pain, swelling, redness, fluid drainage, or bleeding.  You have muscle aches, chills, or a general ill feeling.  You have a fever. MAKE SURE YOU:   Understand these instructions.  Will watch your condition.  Will get help right away if you are not doing well or get worse.   This information is not intended to replace advice given to you by your health care provider. Make sure you discuss any questions you have with your health care provider.   Document Released: 05/13/2005 Document Revised: 02/02/2012 Document Reviewed: 10/16/2011 Elsevier Interactive Patient Education 2016 Else[employer]   Tendinitis Tendinitis is swelling and inflammation of the tendons. Tendons are band-like tissues that connect muscle to bone. Tendinitis commonly occurs in the:   Shoulders (rotator cuff).  Heels (Achilles tendon).  Elbows (triceps tendon). CAUSES Tendinitis is usually caused by overusing the tendon, muscles, and joints involved. When the tissue surrounding a tendon (synovium) becomes inflamed, it is called tenosynovitis. Tendinitis commonly develops in people whose jobs require repetitive motions. SYMPTOMS  Pain.  Tenderness.  Mild swelling. DIAGNOSIS Tendinitis is usually diagnosed by physical exam. Your health care provider may also order X-rays or other imaging tests. TREATMENT Your health care provider may recommend certain medicines or exercises for your  treatment. HOME CARE INSTRUCTIONS   Use a sling or splint for as long as directed by your health care provider until the pain decreases.  Put ice on the injured area.  Put ice in a plastic bag.  Place a towel between your skin and the bag.  Leave the ice on for 15-20 minutes, 3-4 times a day, or as directed by your health care provider.  Avoid using the limb while the tendon is painful. Perform gentle range of motion exercises only as directed by your health care provider. Stop exercises if pain or discomfort increase, unless directed otherwise by your health care provider.  Only take over-the-counter or prescription medicines for pain, discomfort, or fever as directed by your health care provider. SEEK MEDICAL CARE IF:   Your pain and swelling increase.  You develop new, unexplained symptoms, especially increased numbness in the hands. MAKE SURE YOU:   Understand these instructions.  Will watch your condition.  Will get help right away if you are not doing well or get worse.   This information is not intended to replace advice given to you by your health care provider. Make sure you discuss any questions you have with your health care provider.   Document Released: 07/31/2000 Document Revised: 08/24/2014 Document Reviewed: 10/20/2010 Elsevier Interactive Patient Education [employer].

## 2015-12-06 ENCOUNTER — Encounter: Payer: Self-pay | Admitting: Cardiology

## 2015-12-06 ENCOUNTER — Ambulatory Visit (INDEPENDENT_AMBULATORY_CARE_PROVIDER_SITE_OTHER): Payer: Medicaid Other | Admitting: Cardiology

## 2015-12-06 VITALS — BP 102/68 | HR 87 | Ht 62.0 in | Wt 138.0 lb

## 2015-12-06 DIAGNOSIS — R000 Tachycardia, unspecified: Secondary | ICD-10-CM | POA: Diagnosis not present

## 2015-12-06 DIAGNOSIS — Z716 Tobacco abuse counseling: Secondary | ICD-10-CM

## 2015-12-06 DIAGNOSIS — E049 Nontoxic goiter, unspecified: Secondary | ICD-10-CM

## 2015-12-06 DIAGNOSIS — R634 Abnormal weight loss: Secondary | ICD-10-CM | POA: Diagnosis not present

## 2015-12-06 DIAGNOSIS — I499 Cardiac arrhythmia, unspecified: Secondary | ICD-10-CM

## 2015-12-06 DIAGNOSIS — Z720 Tobacco use: Secondary | ICD-10-CM

## 2015-12-06 DIAGNOSIS — E010 Iodine-deficiency related diffuse (endemic) goiter: Secondary | ICD-10-CM

## 2015-12-06 NOTE — Patient Instructions (Signed)
Medication Instructions:  None  Labwork: CMET, TSH, Lipids today  Testing/Procedures: Your physician has recommended that you wear a 24 holter monitor. Holter monitors are medical devices that record the heart's electrical activity. Doctors most often use these monitors to diagnose arrhythmias. Arrhythmias are problems with the speed or rhythm of the heartbeat. The monitor is a small, portable device. You can wear one while you do your normal daily activities. This is usually used to diagnose what is causing palpitations/syncope (passing out).    Follow-Up: Your physician recommends that you schedule a follow-up appointment in: 3 months with Dr. Meda Coffee.   Any Other Special Instructions Will Be Listed Below (If Applicable).     If you need a refill on your cardiac medications before your next appointment, please call your pharmacy.

## 2015-12-06 NOTE — Progress Notes (Signed)
Patient ID: Jill Mcfarland, female   DOB: May 25, 1971, 45 y.o.   MRN: [id number]      Cardiology Office Note   Date:  12/06/2015   ID:  Jill Mcfarland, DOB 08-13-1971, MRN [id number]  PCP:  Ricke Hey, MD  Cardiologist:  Dorothy Spark, MD  Referring physician:  Ricke Hey, MD   Chief Complaint  Patient presents with  . Irregular Heart Beat  . Chest Pain  . Shortness of Breath      History of Present Illness: Jill Mcfarland is a 45 y.o. female with history of ongoing smoking with pack a day for the last 30 years. Patient also has history of anxiety recent treatment for abscess in her buttocks. The patient was referred to [place] by her primary care physician as she was found on multiple location to be in sinus tachycardia with heart rate up to 140. The patient doesn't work but is able to complete all the activities of daily living. She denies any chest pain or shortness of breath other when her COPD is acting up or she has allergies. She denies any syncope. She has been experiencing palpitations that last seconds and are not associated with dizziness or presyncopal episode. She is also complaining of unintentional weight loss in the last few months and difficulty swallowing where she feels like she has a lump in her throat.  Her mother died of leukemia at age of 26. Father doesn't have any heart disease. There is no family history of premature coronary artery disease or sudden cardiac death. Her brother underwent surgery for hole in the heart as a baby but she doesn't know any more details.  Past Medical History  Diagnosis Date  . Abscess   . Bartholin cyst   . Depression     History - no current prob per pt.  . Anxiety     History - no current prob per pt.  . Bronchitis     Past Surgical History  Procedure Laterality Date  . Tubal ligation    . Leep    . Incison and drainage      Bartholin cyst  . Wisdom tooth extraction    . Svd      x 3  . Bartholin cyst  marsupialization  12/03/2011    Procedure: BARTHOLIN CYST MARSUPIALIZATION;  Surgeon: Mora Bellman, MD;  Location: [facility];  Service: Gynecology;  Laterality: Left;  Bartholin Abcess     Current Outpatient Prescriptions  Medication Sig Dispense Refill  . acetaminophen (TYLENOL) 500 MG tablet Take 1,000 mg by mouth every 6 (six) hours as needed for mild pain or moderate pain.    . Aspirin-Caffeine (BC FAST PAIN RELIEF PO) Take 1 packet by mouth daily as needed (headache).    . doxycycline (VIBRAMYCIN) 100 MG capsule Take 1 capsule (100 mg total) by mouth 2 (two) times daily. 20 capsule 0   No current facility-administered medications for this visit.    Allergies:   Tramadol and Vicodin    Social History:  The patient  reports that she has been smoking Cigarettes.  She has a 12 pack-year smoking history. She has never used smokeless tobacco. She reports that she does not drink alcohol or use illicit drugs.   Family History:  The patient's family history includes Asthma in her son; Cancer in her mother.    ROS:  Please see the history of present illness.   Otherwise, review of systems are positive for none.   All  other systems are reviewed and negative.    PHYSICAL EXAM: VS:  BP 102/68 mmHg  Pulse 87  Ht 5\' 2"  (1.575 m)  Wt 138 lb (62.596 kg)  BMI 25.23 kg/m2 , BMI Body mass index is 25.23 kg/(m^2). GEN: Well nourished, well developed, in no acute distress HEENT: normal Neck: no JVD, carotid bruits, thyromegaly bilaterally Cardiac: RRR; no murmurs, rubs, or gallops,no edema  Respiratory:  clear to auscultation bilaterally, normal work of breathing GI: soft, nontender, nondistended, + BS MS: no deformity or atrophy Skin: warm and dry, no rash Neuro:  Strength and sensation are intact Psych: euthymic mood, full affect  EKG:  EKG is ordered today. The ekg ordered today demonstrates SR, normal ECG  Recent Labs: 01/27/2015: Hemoglobin 15.1*; Platelets 256   Lipid Panel No  results found for: CHOL, TRIG, HDL, CHOLHDL, VLDL, LDLCALC, LDLDIRECT   Wt Readings from Last 3 Encounters:  12/06/15 138 lb (62.596 kg)  01/27/15 140 lb (63.504 kg)  09/11/14 145 lb (65.772 kg)      ASSESSMENT AND PLAN:  1.  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  2. Thyromegaly, dysphagia, weight loss - we will check for TSH and if normal we will refer back to primary care physician for further evaluation of dysphagia and possible malignancy and heavy smoker with unintentional weight loss.  3. Lipids - never checked, we'll check CMP and lipids  4. Smoking cessation - discussed, she seems to be struggling with this  Follow up in 3 months.  Signed, Dorothy Spark, MD  12/06/2015 10:03 AM    [facility] [place], [place], [place]  [address] Phone: [phone]; Fax: [phone]

## 2015-12-10 ENCOUNTER — Other Ambulatory Visit: Payer: Self-pay | Admitting: Cardiology

## 2015-12-10 DIAGNOSIS — I499 Cardiac arrhythmia, unspecified: Secondary | ICD-10-CM

## 2015-12-10 DIAGNOSIS — R000 Tachycardia, unspecified: Secondary | ICD-10-CM

## 2016-02-08 ENCOUNTER — Encounter: Payer: Self-pay | Admitting: Emergency Medicine

## 2016-02-08 ENCOUNTER — Emergency Department: Payer: Medicaid Other

## 2016-02-08 ENCOUNTER — Emergency Department
Admission: EM | Admit: 2016-02-08 | Discharge: 2016-02-08 | Disposition: A | Discharge status: Home / Self Care | Payer: Medicaid Other | Attending: Emergency Medicine | Admitting: Emergency Medicine

## 2016-02-08 DIAGNOSIS — M542 Cervicalgia: Secondary | ICD-10-CM | POA: Diagnosis not present

## 2016-02-08 DIAGNOSIS — Y9389 Activity, other specified: Secondary | ICD-10-CM | POA: Insufficient documentation

## 2016-02-08 DIAGNOSIS — M791 Myalgia: Secondary | ICD-10-CM | POA: Insufficient documentation

## 2016-02-08 DIAGNOSIS — F329 Major depressive disorder, single episode, unspecified: Secondary | ICD-10-CM | POA: Insufficient documentation

## 2016-02-08 DIAGNOSIS — Z792 Long term (current) use of antibiotics: Secondary | ICD-10-CM | POA: Diagnosis not present

## 2016-02-08 DIAGNOSIS — F17210 Nicotine dependence, cigarettes, uncomplicated: Secondary | ICD-10-CM | POA: Diagnosis not present

## 2016-02-08 DIAGNOSIS — Y92410 Unspecified street and highway as the place of occurrence of the external cause: Secondary | ICD-10-CM | POA: Insufficient documentation

## 2016-02-08 DIAGNOSIS — Y999 Unspecified external cause status: Secondary | ICD-10-CM | POA: Insufficient documentation

## 2016-02-08 DIAGNOSIS — K651 Peritoneal abscess: Secondary | ICD-10-CM | POA: Diagnosis not present

## 2016-02-08 DIAGNOSIS — Z79899 Other long term (current) drug therapy: Secondary | ICD-10-CM | POA: Insufficient documentation

## 2016-02-08 DIAGNOSIS — IMO0002 Reserved for concepts with insufficient information to code with codable children: Secondary | ICD-10-CM

## 2016-02-08 DIAGNOSIS — S0990XA Unspecified injury of head, initial encounter: Secondary | ICD-10-CM | POA: Diagnosis present

## 2016-02-08 DIAGNOSIS — M7918 Myalgia, other site: Secondary | ICD-10-CM

## 2016-02-08 MED ORDER — IBUPROFEN 800 MG PO TABS: 800.0000 mg | ORAL_TABLET | Freq: Three times a day (TID) | ORAL | Status: DC | PRN

## 2016-02-08 MED ORDER — DIAZEPAM 5 MG PO TABS
5.0000 mg | ORAL_TABLET | Freq: Once | ORAL | Status: AC
  Administered 2016-02-08: 5 mg via ORAL
  Filled 2016-02-08: qty 1

## 2016-02-08 MED ORDER — FLUCONAZOLE 150 MG PO TABS: 150.0000 mg | ORAL_TABLET | Freq: Every day | ORAL | Status: DC

## 2016-02-08 MED ORDER — KETOROLAC TROMETHAMINE 60 MG/2ML IM SOLN
60.0000 mg | Freq: Once | INTRAMUSCULAR | Status: AC
  Administered 2016-02-08: 60 mg via INTRAMUSCULAR
  Filled 2016-02-08: qty 2

## 2016-02-08 MED ORDER — SULFAMETHOXAZOLE-TRIMETHOPRIM 800-160 MG PO TABS: 1.0000 | ORAL_TABLET | Freq: Two times a day (BID) | ORAL | Status: DC

## 2016-02-08 MED ORDER — CYCLOBENZAPRINE HCL 10 MG PO TABS: 10.0000 mg | ORAL_TABLET | Freq: Three times a day (TID) | ORAL | Status: DC | PRN

## 2016-02-08 NOTE — ED Notes (Signed)
Pt verbalized understanding of discharge instructions. NAD at this time. 
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
## 2016-02-08 NOTE — ED Notes (Signed)
L neck, shoulder and side pain
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
## 2016-03-06 ENCOUNTER — Ambulatory Visit: Payer: Medicaid Other | Admitting: Cardiology

## 2016-03-10 ENCOUNTER — Encounter: Payer: Self-pay | Admitting: Cardiology

## 2016-06-18 ENCOUNTER — Emergency Department (HOSPITAL_COMMUNITY)
Admission: EM | Admit: 2016-06-18 | Discharge: 2016-06-18 | Disposition: A | Discharge status: Home / Self Care | Payer: Medicaid Other | Attending: Emergency Medicine | Admitting: Emergency Medicine

## 2016-06-18 ENCOUNTER — Encounter (HOSPITAL_COMMUNITY): Payer: Self-pay | Admitting: *Deleted

## 2016-06-18 DIAGNOSIS — Z7982 Long term (current) use of aspirin: Secondary | ICD-10-CM | POA: Insufficient documentation

## 2016-06-18 DIAGNOSIS — L0291 Cutaneous abscess, unspecified: Secondary | ICD-10-CM

## 2016-06-18 DIAGNOSIS — L02211 Cutaneous abscess of abdominal wall: Secondary | ICD-10-CM | POA: Insufficient documentation

## 2016-06-18 DIAGNOSIS — F17210 Nicotine dependence, cigarettes, uncomplicated: Secondary | ICD-10-CM | POA: Insufficient documentation

## 2016-06-18 MED ORDER — SULFAMETHOXAZOLE-TRIMETHOPRIM 800-160 MG PO TABS: 1.0000 | ORAL_TABLET | Freq: Two times a day (BID) | ORAL | 0 refills | Status: AC

## 2016-06-18 MED ORDER — LIDOCAINE HCL 2 % IJ SOLN
10.0000 mL | Freq: Once | INTRAMUSCULAR | Status: DC
  Filled 2016-06-18: qty 20

## 2016-06-18 MED ORDER — FLUCONAZOLE 150 MG PO TABS: 150.0000 mg | ORAL_TABLET | Freq: Every day | ORAL | 1 refills | Status: DC

## 2016-06-18 MED ORDER — IBUPROFEN 800 MG PO TABS: 800.0000 mg | ORAL_TABLET | Freq: Three times a day (TID) | ORAL | 0 refills | Status: DC

## 2016-06-18 NOTE — ED Provider Notes (Signed)
[facility] [place] DEPT Provider Note   CSN: [id number] Arrival date & time: 06/18/16  [id number]     History   Chief Complaint Chief Complaint  Patient presents with  . Abscess    HPI Jill Mcfarland is a 45 y.o. female.  The history is provided by the patient. No language interpreter was used.  Abscess  Location:  Torso Torso abscess location:  Abd LUQ Size:  1cm Abscess quality: redness and warmth   Red streaking: yes   Duration:  1 week Progression:  Worsening Chronicity:  New Relieved by:  Nothing Worsened by:  Nothing Ineffective treatments:  None tried Associated symptoms: no nausea and no vomiting    Pt reports multiple abscesses.  Pt does not want an I and D Past Medical History:  Diagnosis Date  . Abscess   . Anxiety    History - no current prob per pt.  . Bartholin cyst   . Bronchitis   . Depression    History - no current prob per pt.    Patient Active Problem List   Diagnosis Date Noted  . Bartholin cyst 02/14/2011  . Furuncle 02/14/2011  . Trichomonas 02/14/2011    Past Surgical History:  Procedure Laterality Date  . BARTHOLIN CYST MARSUPIALIZATION  12/03/2011   Procedure: BARTHOLIN CYST MARSUPIALIZATION;  Surgeon: Mora Bellman, MD;  Location: [facility];  Service: Gynecology;  Laterality: Left;  Bartholin Abcess  . CARPAL TUNNEL RELEASE    . Incison and drainage     Bartholin cyst  . LEEP    . SVD     x 3  . TUBAL LIGATION    . WISDOM TOOTH EXTRACTION      OB History    Gravida Para Term Preterm AB Living   4 3 3   1 2    SAB TAB Ectopic Multiple Live Births     1             Home Medications    Prior to Admission medications   Medication Sig Start Date End Date Taking? Authorizing Provider  acetaminophen (TYLENOL) 500 MG tablet Take 1,000 mg by mouth every 6 (six) hours as needed for mild pain or moderate pain.    Historical Provider, MD  Aspirin-Caffeine (BC FAST PAIN RELIEF PO) Take 1 packet by mouth daily as needed (headache).     Historical Provider, MD  cyclobenzaprine (FLEXERIL) 10 MG tablet Take 1 tablet (10 mg total) by mouth 3 (three) times daily as needed for muscle spasms. 02/08/16   Pierce Crane Beers, PA-C  doxycycline (VIBRAMYCIN) 100 MG capsule Take 1 capsule (100 mg total) by mouth 2 (two) times daily. 09/15/15   Tatyana Kirichenko, PA-C  fluconazole (DIFLUCAN) 150 MG tablet Take 1 tablet (150 mg total) by mouth daily. 02/08/16   Pierce Crane Beers, PA-C  ibuprofen (ADVIL,MOTRIN) 800 MG tablet Take 1 tablet (800 mg total) by mouth every 8 (eight) hours as needed. 02/08/16   Pierce Crane Beers, PA-C  sulfamethoxazole-trimethoprim (BACTRIM DS,SEPTRA DS) 800-160 MG tablet Take 1 tablet by mouth 2 (two) times daily. 02/08/16   Arlyss Repress, PA-C    Family History Family History  Problem Relation Age of Onset  . Cancer Mother   . Asthma Son     Social History Social History  Substance Use Topics  . Smoking status: Current Every Day Smoker    Packs/day: 0.50    Years: 24.00    Types: Cigarettes  . Smokeless tobacco: Never Used  . Alcohol  use No     Allergies   Tramadol and Vicodin [hydrocodone-acetaminophen]   Review of Systems Review of Systems  Gastrointestinal: Negative for nausea and vomiting.  All other systems reviewed and are negative.    Physical Exam Updated Vital Signs BP 122/87 (BP Location: Left Arm)   Pulse 100   Temp 98.3 F (36.8 C) (Oral)   Resp 22   Ht 5\' 2"  (1.575 m)   Wt 58.1 kg   SpO2 96%   BMI 23.41 kg/m   Physical Exam  Constitutional: She appears well-developed and well-nourished. No distress.  HENT:  Head: Normocephalic and atraumatic.  Eyes: Conjunctivae are normal.  Neck: Neck supple.  Cardiovascular: Normal rate and regular rhythm.   No murmur heard. Pulmonary/Chest: Effort normal and breath sounds normal. No respiratory distress.  Abdominal: Soft. There is no tenderness.  Musculoskeletal: She exhibits no edema.  Neurological: She is alert.  Skin: Skin is  warm and dry.  Psychiatric: She has a normal mood and affect.  Nursing note and vitals reviewed.    ED Treatments / Results  Labs (all labs ordered are listed, but only abnormal results are displayed) Labs Reviewed - No data to display  EKG  EKG Interpretation None       Radiology No results found.  Procedures Procedures (including critical care time)  Medications Ordered in ED Medications  lidocaine (XYLOCAINE) 2 % (with pres) injection 200 mg (not administered)     Initial Impression / Assessment and Plan / ED Course  I have reviewed the triage vital signs and the nursing notes.  Pertinent labs & imaging results that were available during my care of the patient were reviewed by me and considered in my medical decision making (see chart for details).  Clinical Course    Culture of abscess obtained  Final Clinical Impressions(s) / ED Diagnoses   Final diagnoses:  Abscess    New Prescriptions New Prescriptions   FLUCONAZOLE (DIFLUCAN) 150 MG TABLET    Take 1 tablet (150 mg total) by mouth daily.   IBUPROFEN (ADVIL,MOTRIN) 800 MG TABLET    Take 1 tablet (800 mg total) by mouth 3 (three) times daily.   SULFAMETHOXAZOLE-TRIMETHOPRIM (BACTRIM DS,SEPTRA DS) 800-160 MG TABLET    Take 1 tablet by mouth 2 (two) times daily.  An After Visit Summary was printed and given to the patient.   Hollace Kinnier Ericson, PA-C 06/18/16 Gordo Yao, MD 06/18/16 1524

## 2016-06-18 NOTE — Discharge Instructions (Signed)
Soak area 20 minutes 4 times a day °

## 2016-06-18 NOTE — ED Triage Notes (Signed)
Pt has large abscess on abd, redness and drainage noted. Denies fever. Also reports having another abscess on buttocks. No acute distress noted at triage.

## 2016-06-20 LAB — AEROBIC CULTURE W GRAM STAIN (SUPERFICIAL SPECIMEN)

## 2016-06-20 LAB — AEROBIC CULTURE  (SUPERFICIAL SPECIMEN): Special Requests: NORMAL

## 2016-06-21 ENCOUNTER — Telehealth (HOSPITAL_BASED_OUTPATIENT_CLINIC_OR_DEPARTMENT_OTHER): Payer: Self-pay

## 2016-06-21 NOTE — Telephone Encounter (Signed)
Post ED Visit - Positive Culture Follow-up  Culture report reviewed by antimicrobial stewardship pharmacist:  []  Elenor Quinones, Pharm.D. []  Heide Guile, Pharm.D., BCPS []  Parks Neptune, Pharm.D. []  Alycia Rossetti, Pharm.D., BCPS []  [place], Pharm.D., BCPS, AAHIVP []  Legrand Como, Pharm.D., BCPS, AAHIVP []  Milus Glazier, Pharm.D. []  Stephens November, [place].D. Dimitri Ped Pharm D Positive Superficial culture Treated with Sulfamethoxazole-Trimethoprim, organism sensitive to the same and no further patient follow-up is required at this time.  Genia Del 06/21/2016, 9:17 AM

## 2016-06-22 ENCOUNTER — Emergency Department (HOSPITAL_COMMUNITY)
Admission: EM | Admit: 2016-06-22 | Discharge: 2016-06-22 | Disposition: A | Discharge status: Home / Self Care | Payer: Medicaid Other | Attending: Emergency Medicine | Admitting: Emergency Medicine

## 2016-06-22 ENCOUNTER — Encounter (HOSPITAL_COMMUNITY): Payer: Self-pay | Admitting: Emergency Medicine

## 2016-06-22 DIAGNOSIS — L723 Sebaceous cyst: Secondary | ICD-10-CM | POA: Insufficient documentation

## 2016-06-22 DIAGNOSIS — F17210 Nicotine dependence, cigarettes, uncomplicated: Secondary | ICD-10-CM | POA: Insufficient documentation

## 2016-06-22 DIAGNOSIS — Z7982 Long term (current) use of aspirin: Secondary | ICD-10-CM | POA: Insufficient documentation

## 2016-06-22 DIAGNOSIS — L0291 Cutaneous abscess, unspecified: Secondary | ICD-10-CM

## 2016-06-22 MED ORDER — LIDOCAINE-EPINEPHRINE (PF) 2 %-1:200000 IJ SOLN
10.0000 mL | Freq: Once | INTRAMUSCULAR | Status: AC
  Administered 2016-06-22: 10 mL via INTRADERMAL
  Filled 2016-06-22: qty 10

## 2016-06-22 MED ORDER — OXYCODONE-ACETAMINOPHEN 5-325 MG PO TABS: 2.0000 | ORAL_TABLET | ORAL | 0 refills | Status: DC | PRN

## 2016-06-22 MED ORDER — OXYCODONE-ACETAMINOPHEN 5-325 MG PO TABS
1.0000 | ORAL_TABLET | Freq: Once | ORAL | Status: AC
  Administered 2016-06-22: 1 via ORAL
  Filled 2016-06-22: qty 1

## 2016-06-22 NOTE — Discharge Instructions (Signed)
Soak, and remove the gauze from the wound in 48 hours.  Stop smoking.  Call [facility] to discuss outpatient appointment to schedule formal excision/removal of this cyst

## 2016-06-22 NOTE — ED Notes (Signed)
Called Pharmacy about patient's Lidocaine

## 2016-06-22 NOTE — ED Triage Notes (Signed)
Pt st's she was here 4 days ago with abscess to left lower abd.  Pt st's she is currently taking Bactrim for same but area is more painful

## 2016-06-22 NOTE — ED Provider Notes (Signed)
[facility] DEPT Provider Note   CSN: [id number] Arrival date & time: 06/22/16  1507     History   Chief Complaint Chief Complaint  Patient presents with  . Abscess    HPI Jill Mcfarland is a 45 y.o. female. His initial evaluation of an abdominal wall abscess. Seen and evaluated here 2 days ago. She states that she essentially refused incision and drainage. States that it was already draining a little bit" they did a swab on it". She was placed on Bactrim and culture was obtained. It shows pansensitive staph. History of recurrent abdominal wall, skin, groin abscesses and previous multiple incision and drainage procedures.  HPI  Past Medical History:  Diagnosis Date  . Abscess   . Anxiety    History - no current prob per pt.  . Bartholin cyst   . Bronchitis   . Depression    History - no current prob per pt.    Patient Active Problem List   Diagnosis Date Noted  . Bartholin cyst 02/14/2011  . Furuncle 02/14/2011  . Trichomonas 02/14/2011    Past Surgical History:  Procedure Laterality Date  . BARTHOLIN CYST MARSUPIALIZATION  12/03/2011   Procedure: BARTHOLIN CYST MARSUPIALIZATION;  Surgeon: Mora Bellman, MD;  Location: [facility];  Service: Gynecology;  Laterality: Left;  Bartholin Abcess  . CARPAL TUNNEL RELEASE    . Incison and drainage     Bartholin cyst  . LEEP    . SVD     x 3  . TUBAL LIGATION    . WISDOM TOOTH EXTRACTION      OB History    Gravida Para Term Preterm AB Living   4 3 3   1 2    SAB TAB Ectopic Multiple Live Births     1             Home Medications    Prior to Admission medications   Medication Sig Start Date End Date Taking? Authorizing Provider  acetaminophen (TYLENOL) 500 MG tablet Take 1,000 mg by mouth every 6 (six) hours as needed for mild pain or moderate pain.    Historical Provider, MD  Aspirin-Caffeine (BC FAST PAIN RELIEF PO) Take 1 packet by mouth daily as needed (headache).    Historical Provider, MD  cyclobenzaprine  (FLEXERIL) 10 MG tablet Take 1 tablet (10 mg total) by mouth 3 (three) times daily as needed for muscle spasms. 02/08/16   Pierce Crane Beers, PA-C  doxycycline (VIBRAMYCIN) 100 MG capsule Take 1 capsule (100 mg total) by mouth 2 (two) times daily. 09/15/15   Tatyana Kirichenko, PA-C  fluconazole (DIFLUCAN) 150 MG tablet Take 1 tablet (150 mg total) by mouth daily. 06/18/16   [name], PA-C  ibuprofen (ADVIL,MOTRIN) 800 MG tablet Take 1 tablet (800 mg total) by mouth 3 (three) times daily. 06/18/16   [name], PA-C  oxyCODONE-acetaminophen (PERCOCET/ROXICET) 5-325 MG tablet Take 2 tablets by mouth every 4 (four) hours as needed. 06/22/16   [name], MD  sulfamethoxazole-trimethoprim (BACTRIM DS,SEPTRA DS) 800-160 MG tablet Take 1 tablet by mouth 2 (two) times daily. 06/18/16 06/25/16  [name], PA-C    Family History Family History  Problem Relation Age of Onset  . Cancer Mother   . Asthma Son     Social History Social History  Substance Use Topics  . Smoking status: Current Every Day Smoker    Packs/day: 0.50    Years: 24.00    Types: Cigarettes  . Smokeless tobacco:  Never Used  . Alcohol use No     Allergies   Tramadol and Vicodin [hydrocodone-acetaminophen]   Review of Systems Review of Systems  Constitutional: Negative for appetite change, chills, diaphoresis, fatigue and fever.  HENT: Negative for mouth sores, sore throat and trouble swallowing.   Eyes: Negative for visual disturbance.  Respiratory: Negative for cough, chest tightness, shortness of breath and wheezing.   Cardiovascular: Negative for chest pain.  Gastrointestinal: Negative for abdominal distention, abdominal pain, diarrhea, nausea and vomiting.  Endocrine: Negative for polydipsia, polyphagia and polyuria.  Genitourinary: Negative for dysuria, frequency and hematuria.  Musculoskeletal: Negative for gait problem.  Skin: Negative for color change, pallor and rash.       Abdominal wall abscess    Neurological: Negative for dizziness, syncope, light-headedness and headaches.  Hematological: Does not bruise/bleed easily.  Psychiatric/Behavioral: Negative for behavioral problems and confusion.     Physical Exam Updated Vital Signs BP 104/86 (BP Location: Left Arm)   Pulse 92   Temp 98.2 F (36.8 C) (Oral)   Resp 17   Ht 5\' 2"  (1.575 m)   Wt 130 lb (59 kg)   LMP 06/03/2016 (Approximate)   SpO2 94%   BMI 23.78 kg/m   Physical Exam  Constitutional: She is oriented to person, place, and time. She appears well-developed and well-nourished. No distress.  HENT:  Head: Normocephalic.  Eyes: Conjunctivae are normal. Pupils are equal, round, and reactive to light. No scleral icterus.  Neck: Normal range of motion. Neck supple. No thyromegaly present.  Cardiovascular: Normal rate and regular rhythm.  Exam reveals no gallop and no friction rub.   No murmur heard. Pulmonary/Chest: Effort normal and breath sounds normal. No respiratory distress. She has no wheezes. She has no rales.  Abdominal: Soft. Bowel sounds are normal. She exhibits no distension. There is no tenderness. There is no rebound.    Musculoskeletal: Normal range of motion.  Neurological: She is alert and oriented to person, place, and time.  Skin: Skin is warm and dry. No rash noted.  Psychiatric: She has a normal mood and affect. Her behavior is normal.     ED Treatments / Results  Labs (all labs ordered are listed, but only abnormal results are displayed) Labs Reviewed - No data to display  EKG  EKG Interpretation None       Radiology No results found.  Procedures Procedures (including critical care time)  Medications Ordered in ED Medications  oxyCODONE-acetaminophen (PERCOCET/ROXICET) 5-325 MG per tablet 1 tablet (not administered)  lidocaine-EPINEPHrine (XYLOCAINE W/EPI) 2 %-1:200000 (PF) injection 10 mL (10 mLs Intradermal Given 06/22/16 1708)     Initial Impression / Assessment and Plan  / ED Course  I have reviewed the triage vital signs and the nursing notes.  Pertinent labs & imaging results that were available during my care of the patient were reviewed by me and considered in my medical decision making (see chart for details).  Clinical Course     INCISION AND DRAINAGE Performed by: Lolita Patella Consent: Verbal consent obtained. Risks and benefits: risks, benefits and alternatives were discussed Type: abscess  Body area: LLQ abdominal wall  Anesthesia: local infiltration  Incision was made with a scalpel.  Local anesthetic: lidocaine 1% c epinephrine  Anesthetic total: 2 ml  Complexity: complex Blunt dissection to break up loculations  Drainage: purulent  Drainage amount: scant, purulent  Packing material: 1/4 in iodoform gauze  Patient tolerance: Patient tolerated the procedure well with no immediate complications.  After purulence expressed. Obvious sebaceous material also removed from wound. Remnants of sebaceous cyst persists.   Final Clinical Impressions(s) / ED Diagnoses   Final diagnoses:  Abscess  Sebaceous cyst   Soak and remove the gauze in 48 hours. Contact [facility] to discuss formal surgical removal of the cyst   New Prescriptions New Prescriptions   OXYCODONE-ACETAMINOPHEN (PERCOCET/ROXICET) 5-325 MG TABLET    Take 2 tablets by mouth every 4 (four) hours as needed.     [name], MD 06/22/16 1729
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
## 2016-06-22 NOTE — ED Notes (Signed)
MD at the bedside performing lancing of abscess

## 2016-06-22 NOTE — ED Notes (Signed)
MD at the bedside with US

## 2016-07-08 ENCOUNTER — Emergency Department (HOSPITAL_COMMUNITY): Payer: Self-pay

## 2016-07-08 ENCOUNTER — Emergency Department (HOSPITAL_COMMUNITY)
Admission: EM | Admit: 2016-07-08 | Discharge: 2016-07-08 | Disposition: A | Discharge status: Home / Self Care | Payer: Self-pay | Attending: Emergency Medicine | Admitting: Emergency Medicine

## 2016-07-08 ENCOUNTER — Encounter (HOSPITAL_COMMUNITY): Payer: Self-pay

## 2016-07-08 DIAGNOSIS — Z79899 Other long term (current) drug therapy: Secondary | ICD-10-CM | POA: Insufficient documentation

## 2016-07-08 DIAGNOSIS — R200 Anesthesia of skin: Secondary | ICD-10-CM | POA: Insufficient documentation

## 2016-07-08 DIAGNOSIS — R202 Paresthesia of skin: Secondary | ICD-10-CM | POA: Insufficient documentation

## 2016-07-08 DIAGNOSIS — R935 Abnormal findings on diagnostic imaging of other abdominal regions, including retroperitoneum: Secondary | ICD-10-CM | POA: Insufficient documentation

## 2016-07-08 DIAGNOSIS — F17210 Nicotine dependence, cigarettes, uncomplicated: Secondary | ICD-10-CM | POA: Insufficient documentation

## 2016-07-08 LAB — BASIC METABOLIC PANEL
Anion gap: 9 (ref 5–15)
BUN: 16 mg/dL (ref 6–20)
CO2: 28 mmol/L (ref 22–32)
Calcium: 9.2 mg/dL (ref 8.9–10.3)
Chloride: 99 mmol/L — ABNORMAL LOW (ref 101–111)
Creatinine, Ser: 0.63 mg/dL (ref 0.44–1.00)
GFR calc Af Amer: >60 mL/min (ref >60)
GFR calc non Af Amer: >60 mL/min (ref >60)
Glucose, Bld: 93 mg/dL (ref 65–99)
Potassium: 3.7 mmol/L (ref 3.5–5.1)
Sodium: 136 mmol/L (ref 135–145)

## 2016-07-08 LAB — URINALYSIS, ROUTINE W REFLEX MICROSCOPIC
Glucose, UA: NEGATIVE mg/dL
Hgb urine dipstick: NEGATIVE
Ketones, ur: NEGATIVE mg/dL
Nitrite: NEGATIVE
Protein, ur: NEGATIVE mg/dL
Specific Gravity, Urine: 1.017 (ref 1.005–1.030)
pH: 6 (ref 5.0–8.0)

## 2016-07-08 LAB — CBC WITH DIFFERENTIAL/PLATELET
Basophils Absolute: 0 10*3/uL (ref 0.0–0.1)
Basophils Relative: 0 %
Eosinophils Absolute: 0 10*3/uL (ref 0.0–0.7)
Eosinophils Relative: 0 %
HCT: 52.2 % — ABNORMAL HIGH (ref 36.0–46.0)
Hemoglobin: 18.4 g/dL — ABNORMAL HIGH (ref 12.0–15.0)
Lymphocytes Relative: 28 %
Lymphs Abs: 3.4 10*3/uL (ref 0.7–4.0)
MCH: 31.7 pg (ref 26.0–34.0)
MCHC: 35.2 g/dL (ref 30.0–36.0)
MCV: 90 fL (ref 78.0–100.0)
Monocytes Absolute: 0.8 10*3/uL (ref 0.1–1.0)
Monocytes Relative: 7 %
Neutro Abs: 8 10*3/uL — ABNORMAL HIGH (ref 1.7–7.7)
Neutrophils Relative %: 65 %
Platelets: 234 10*3/uL (ref 150–400)
RBC: 5.8 MIL/uL — ABNORMAL HIGH (ref 3.87–5.11)
RDW: 13.1 % (ref 11.5–15.5)
WBC: 12.2 10*3/uL — ABNORMAL HIGH (ref 4.0–10.5)

## 2016-07-08 LAB — I-STAT CG4 LACTIC ACID, ED: Lactic Acid, Venous: 1.39 mmol/L (ref 0.5–1.9)

## 2016-07-08 LAB — URINE MICROSCOPIC-ADD ON

## 2016-07-08 LAB — POC URINE PREG, ED: Preg Test, Ur: NEGATIVE

## 2016-07-08 LAB — D-DIMER, QUANTITATIVE: D-Dimer, Quant: 1.23 ug/mL-FEU — ABNORMAL HIGH (ref 0.00–0.50)

## 2016-07-08 MED ORDER — SODIUM CHLORIDE 0.9 % IV BOLUS (SEPSIS)
1000.0000 mL | Freq: Once | INTRAVENOUS | Status: AC
  Administered 2016-07-08: 1000 mL via INTRAVENOUS

## 2016-07-08 MED ORDER — IOPAMIDOL (ISOVUE-370) INJECTION 76%
100.0000 mL | Freq: Once | INTRAVENOUS | Status: AC | PRN
  Administered 2016-07-08: 100 mL via INTRAVENOUS

## 2016-07-08 MED ORDER — MORPHINE SULFATE (PF) 4 MG/ML IV SOLN
4.0000 mg | Freq: Once | INTRAVENOUS | Status: AC
  Administered 2016-07-08: 4 mg via INTRAVENOUS
  Filled 2016-07-08: qty 1

## 2016-07-08 MED ORDER — KETOROLAC TROMETHAMINE 15 MG/ML IJ SOLN: 15.0000 mg | Freq: Once | INTRAMUSCULAR | Status: DC

## 2016-07-08 MED ORDER — IOPAMIDOL (ISOVUE-370) INJECTION 76%
INTRAVENOUS | Status: AC
  Filled 2016-07-08: qty 100

## 2016-07-08 MED ORDER — CEFTRIAXONE SODIUM 1 G IJ SOLR
1.0000 g | Freq: Once | INTRAMUSCULAR | Status: AC
  Administered 2016-07-08: 1 g via INTRAVENOUS
  Filled 2016-07-08: qty 10

## 2016-07-08 MED ORDER — SODIUM CHLORIDE 0.9 % IJ SOLN
INTRAMUSCULAR | Status: AC
  Filled 2016-07-08: qty 50

## 2016-07-08 MED ORDER — ONDANSETRON HCL 4 MG/2ML IJ SOLN
4.0000 mg | Freq: Once | INTRAMUSCULAR | Status: AC
  Administered 2016-07-08: 4 mg via INTRAVENOUS
  Filled 2016-07-08: qty 2

## 2016-07-08 NOTE — ED Notes (Signed)
To MRI at this time.
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
## 2016-07-08 NOTE — Discharge Instructions (Signed)
Please take ibuprofen and Tylenol for pain. All of your imaging has been normal today. Please follow-up with a neurosurgeon if your symptoms persist. Follow-up with the primary care doctor. Return to the ED if you develop worsening symptoms or if he developed chest pain, shortness of breath, worsening weakness, headaches, vision changes. If your urine cultures is positive we'll call you with results.

## 2016-07-08 NOTE — ED Provider Notes (Signed)
[place] DEPT Provider Note   CSN: [id number] Arrival date & time: 07/08/16  1036     History   Chief Complaint Chief Complaint  Patient presents with  . Numbness    HPI Jill Mcfarland is a 45 y.o. female.  45 year old Caucasian female past medical history significant for anxiety, depression, recurrent abscesses, chronic low back pain presents to the ED today by EMS with bilateral lower extremity numbness or weakness. Patient states that approximately 3 days ago she started developing numbness below her bilateral knees has progressed to weakness of her bilateral lower extremities. Patient states for the past 2 days she has been lying in bed and has not gotten up. She is not eating or drinking anything and has not used the restroom. Patient states that she try to get this morning and was able to ambulate. Patient states that she does have chronic low back pain. Patient also endorses pain in the posterior bilateral thighs that started approximately 2 days ago has gradually worsened. Patient states the pain is constant. Nothing makes the pain better or worse. She has not tried anything for the pain at home. She denies any fever, chills, headache, vision changes, lightheadedness, dizziness, chest pain, shortness of breath, abdominal pain, nausea, emesis, urinary symptoms, urinary retention, loss of bowel or bladder, change in bowel habits, saddle paresthesias, history of IV drug use, history of cancer. Patient denies any history of OCPs, prolonged immobilizations, recent hospitalizations or surgeries, history of DVTs, unilateral leg swelling, unilateral calf tenderness.      Past Medical History:  Diagnosis Date  . Abscess   . Anxiety    History - no current prob per pt.  . Bartholin cyst   . Bronchitis   . Depression    History - no current prob per pt.    Patient Active Problem List   Diagnosis Date Noted  . Bartholin cyst 02/14/2011  . Furuncle 02/14/2011  . Trichomonas  02/14/2011    Past Surgical History:  Procedure Laterality Date  . BARTHOLIN CYST MARSUPIALIZATION  12/03/2011   Procedure: BARTHOLIN CYST MARSUPIALIZATION;  Surgeon: Mora Bellman, MD;  Location: [facility];  Service: Gynecology;  Laterality: Left;  Bartholin Abcess  . CARPAL TUNNEL RELEASE    . Incison and drainage     Bartholin cyst  . LEEP    . SVD     x 3  . TUBAL LIGATION    . WISDOM TOOTH EXTRACTION      OB History    Gravida Para Term Preterm AB Living   4 3 3   1 2    SAB TAB Ectopic Multiple Live Births     1             Home Medications    Prior to Admission medications   Medication Sig Start Date End Date Taking? Authorizing Provider  acetaminophen (TYLENOL) 500 MG tablet Take 1,000 mg by mouth every 6 (six) hours as needed for mild pain or moderate pain.    Historical Provider, MD  Aspirin-Caffeine (BC FAST PAIN RELIEF PO) Take 1 packet by mouth daily as needed (headache).    Historical Provider, MD  cyclobenzaprine (FLEXERIL) 10 MG tablet Take 1 tablet (10 mg total) by mouth 3 (three) times daily as needed for muscle spasms. 02/08/16   Pierce Crane Beers, PA-C  doxycycline (VIBRAMYCIN) 100 MG capsule Take 1 capsule (100 mg total) by mouth 2 (two) times daily. 09/15/15   Tatyana Kirichenko, PA-C  fluconazole (DIFLUCAN) 150 MG tablet  Take 1 tablet (150 mg total) by mouth daily. 06/18/16   [name], PA-C  ibuprofen (ADVIL,MOTRIN) 800 MG tablet Take 1 tablet (800 mg total) by mouth 3 (three) times daily. 06/18/16   [name], PA-C  oxyCODONE-acetaminophen (PERCOCET/ROXICET) 5-325 MG tablet Take 2 tablets by mouth every 4 (four) hours as needed. 06/22/16   [name], MD    Family History Family History  Problem Relation Age of Onset  . Cancer Mother   . Asthma Son     Social History Social History  Substance Use Topics  . Smoking status: Current Every Day Smoker    Packs/day: 0.50    Years: 24.00    Types: Cigarettes  . Smokeless tobacco: Never Used  .  Alcohol use No     Allergies   Tramadol and Vicodin [hydrocodone-acetaminophen]   Review of Systems Review of Systems  Constitutional: Negative for chills and fever.  HENT: Negative for ear pain, rhinorrhea and sore throat.   Eyes: Negative for pain and visual disturbance.  Respiratory: Negative for cough and shortness of breath.   Cardiovascular: Negative for chest pain, palpitations and leg swelling.  Gastrointestinal: Negative for abdominal pain and vomiting.  Genitourinary: Negative for dysuria, flank pain, frequency, hematuria and urgency.  Musculoskeletal: Positive for back pain and gait problem. Negative for arthralgias, neck pain and neck stiffness.  Skin: Negative for color change and rash.  Neurological: Positive for weakness (lower extremitiy bilaterally) and numbness (bilaterally lower extremity). Negative for dizziness, syncope, light-headedness and headaches.  All other systems reviewed and are negative.    Physical Exam Updated Vital Signs BP 108/86 (BP Location: Right Arm)   Pulse (!) 123   Temp 99 F (37.2 C) (Oral)   Resp 18   LMP 06/26/2016 (Approximate)   SpO2 98%   Physical Exam  Constitutional: She is oriented to person, place, and time. She appears well-developed and well-nourished. No distress.  HENT:  Head: Normocephalic and atraumatic.  Mouth/Throat: Oropharynx is clear and moist.  Eyes: Conjunctivae and EOM are normal. Pupils are equal, round, and reactive to light. Right eye exhibits no discharge. Left eye exhibits no discharge. No scleral icterus.  Neck: Normal range of motion. Neck supple. No thyromegaly present.  No midline C-spine tenderness.  Cardiovascular: Normal rate, regular rhythm, normal heart sounds and intact distal pulses.  Exam reveals no gallop and no friction rub.   No murmur heard. Pulmonary/Chest: Effort normal and breath sounds normal. No respiratory distress.  Abdominal: Soft. Bowel sounds are normal. She exhibits no  distension. There is no tenderness. There is no rebound and no guarding.  Genitourinary:  Genitourinary Comments: Patient refuses rectal exam.  Musculoskeletal: Normal range of motion. She exhibits no edema or deformity.  Moving all 4 extremities without any difficulties. No bilateral calf tenderness. No lower extremities edema. Cap refill is normal. DP pulses are 2+ bilaterally.  L-spine midline tenderness to palpation. No deformities or step-offs noted. Mild paraspinal tenderness bilaterally. Positive straight leg raise test bilaterally. The T-spine midline tenderness.   Lymphadenopathy:    She has no cervical adenopathy.  Neurological: She is alert and oriented to person, place, and time.  The patient is alert, attentive, and oriented x 3. Speech is clear. Cranial nerve II-VII grossly intact. Negative pronator drift. Strength 5/5 in upper extremities. Strength 4/5 in lower extremities bilaterally due to pain. Reflexes 2+ and symmetric at biceps, triceps, knees, and ankles. Rapid alternating movement and fine finger movements intact. Sensation in tact in  upper and lower extremities to sharp and dull. Proprioception is intact in all four extremities.  Unable to access Romberg or gait due to lower extremities weakness.   Skin: Skin is warm and dry. Capillary refill takes less than 2 seconds.  Nursing note and vitals reviewed.    ED Treatments / Results  Labs (all labs ordered are listed, but only abnormal results are displayed) Labs Reviewed  URINE CULTURE - Abnormal; Notable for the following:       Result Value   Culture MULTIPLE SPECIES PRESENT, SUGGEST RECOLLECTION (*)    All other components within normal limits  BASIC METABOLIC PANEL - Abnormal; Notable for the following:    Chloride 99 (*)    All other components within normal limits  CBC WITH DIFFERENTIAL/PLATELET - Abnormal; Notable for the following:    WBC 12.2 (*)    RBC 5.80 (*)    Hemoglobin 18.4 (*)    HCT 52.2 (*)      Neutro Abs 8.0 (*)    All other components within normal limits  D-DIMER, QUANTITATIVE (NOT AT [facility]) - Abnormal; Notable for the following:    D-Dimer, Quant 1.23 (*)    All other components within normal limits  URINALYSIS, ROUTINE W REFLEX MICROSCOPIC (NOT AT [facility]) - Abnormal; Notable for the following:    Color, Urine AMBER (*)    APPearance CLOUDY (*)    Bilirubin Urine SMALL (*)    Leukocytes, UA SMALL (*)    All other components within normal limits  URINE MICROSCOPIC-ADD ON - Abnormal; Notable for the following:    Squamous Epithelial / LPF 6-30 (*)    Bacteria, UA FEW (*)    All other components within normal limits  CULTURE, BLOOD (ROUTINE X 2)  CULTURE, BLOOD (ROUTINE X 2)  I-STAT CG4 LACTIC ACID, ED  POC URINE PREG, ED    EKG  EKG Interpretation  Date/Time:  Wednesday July 08 2016 11:00:03 EST Ventricular Rate:  124 PR Interval:    QRS Duration: 72 QT Interval:  318 QTC Calculation: 457 R Axis:   73 Text Interpretation:  Sinus tachycardia Ventricular premature complex Aberrant complex Nonspecific T abnormalities, anterior leads When compared with ECG of 05/26/2014 Nonspecific T wave abnormality is now Present Confirmed by [facility]  MD, Nunzio Cory [phone]) on 07/09/2016 10:00:30 PM       Radiology Ct Angio Chest Pe W/cm &/or Wo Cm  Result Date: 07/08/2016 CLINICAL DATA:  45 year old female with leg numbness and weakness for 3 days. Chronic back pain. EXAM: CT ANGIOGRAPHY CHEST CT ABDOMEN AND PELVIS WITH CONTRAST TECHNIQUE: Multidetector CT imaging of the chest was performed using the standard protocol during bolus administration of intravenous contrast. Multiplanar CT image reconstructions and MIPs were obtained to evaluate the vascular anatomy. Multidetector CT imaging of the abdomen and pelvis was performed using the standard protocol during bolus administration of intravenous contrast. CONTRAST:  One hundred cc Isovue 370 COMPARISON:  None. FINDINGS: CTA CHEST  FINDINGS Cardiovascular: Heart: No cardiomegaly. No pericardial fluid/thickening. No significant coronary calcifications. Aorta: Unremarkable course, caliber, contour of the thoracic aorta. No aneurysm or dissection flap. No periaortic fluid. Pulmonary arteries: No central, lobar, segmental, or proximal subsegmental filling defects. Mediastinum/Nodes: Mediastinal lymph nodes are present, none of which are enlarged by CT size criteria. Unremarkable appearance of the thoracic esophagus. Unremarkable appearance of the thoracic inlet and thyroid. Lungs/Pleura: Pattern of centrilobular nodules without significant interlobular septal thickening. No pleural effusion confluent airspace disease, or pneumothorax. Minimal atelectasis/scarring at the  lung bases. No bronchial wall thickening or endobronchial debris. Musculoskeletal: No displaced fracture. Degenerative changes of the spine. Review of the MIP images confirms the above findings. CT ABDOMEN and PELVIS FINDINGS Hepatobiliary: No focal liver abnormality is seen. No gallstones, gallbladder wall thickening, or biliary dilatation. Focal fatty infiltration at the falciform ligament. Pancreas: Unremarkable. No pancreatic ductal dilatation or surrounding inflammatory changes. Spleen: Normal in size without focal abnormality. Adrenals/Urinary Tract: Adrenal glands are unremarkable. Kidneys are normal, without renal calculi, or hydronephrosis. Unremarkable bladder. Low-density rounded cystic structures of the left kidney are most likely Bosniak 1 cysts. Stomach/Bowel: Unremarkable stomach. Unremarkable small bowel. No abnormally distended small bowel or colon. No transition point. Normal appendix. Moderate stool burden. Vascular/Lymphatic: Calcifications of the abdominal aorta. No aneurysm. No periaortic fluid. Reproductive: Uterus and bilateral adnexa are unremarkable. Other: No abdominal wall hernia or abnormality. No abdominopelvic ascites. Musculoskeletal: No displaced  fracture. Minimal degenerative changes of the spine. Review of the MIP images confirms the above findings. IMPRESSION: Chest: Study is negative for pulmonary emboli. Pattern of diffuse centrilobular ground-glass attenuation micronodularity throughout the lungs bilaterally. This is likely a manifestation of a smoking related disease such as RB (respiratory bronchiolitis), or if the patient is symptomatic, RB-ILD (respiratory bronchiolitis-interstitial lung disease). Abdomen: No acute abdominal finding. Aortic atherosclerosis. Signed, Dulcy Fanny. Earleen Newport, DO [facility] Electronically Signed   By: Corrie Mckusick D.O.   On: 07/08/2016 15:58   Mr Lumbar Spine Wo Contrast  Result Date: 07/08/2016 CLINICAL DATA:  Bilateral leg numbness and weakness for 3 days. Chronic low back pain. EXAM: MRI LUMBAR SPINE WITHOUT CONTRAST TECHNIQUE: Multiplanar, multisequence MR imaging of the lumbar spine was performed. No intravenous contrast was administered. COMPARISON:  CT scan of the abdomen and pelvis dated 07/08/2016 FINDINGS: Segmentation:  Normal. Alignment:  Normal. Vertebrae:  Normal. Conus medullaris: Extends to the L2 level and appears normal. Paraspinal and other soft tissues: Normal. Disc levels: T11-12 through L5-S1: The discs are normal. There is no disc bulge, protrusion, desiccation, or other abnormality. IMPRESSION: Normal MRI of the lumbar spine. Electronically Signed   By: Lorriane Shire M.D.   On: 07/08/2016 18:29   Ct Abdomen Pelvis W Contrast  Result Date: 07/08/2016 CLINICAL DATA:  45 year old female with leg numbness and weakness for 3 days. Chronic back pain. EXAM: CT ANGIOGRAPHY CHEST CT ABDOMEN AND PELVIS WITH CONTRAST TECHNIQUE: Multidetector CT imaging of the chest was performed using the standard protocol during bolus administration of intravenous contrast. Multiplanar CT image reconstructions and MIPs were obtained to evaluate the vascular  anatomy. Multidetector CT imaging of the abdomen and pelvis was performed using the standard protocol during bolus administration of intravenous contrast. CONTRAST:  One hundred cc Isovue 370 COMPARISON:  None. FINDINGS: CTA CHEST FINDINGS Cardiovascular: Heart: No cardiomegaly. No pericardial fluid/thickening. No significant coronary calcifications. Aorta: Unremarkable course, caliber, contour of the thoracic aorta. No aneurysm or dissection flap. No periaortic fluid. Pulmonary arteries: No central, lobar, segmental, or proximal subsegmental filling defects. Mediastinum/Nodes: Mediastinal lymph nodes are present, none of which are enlarged by CT size criteria. Unremarkable appearance of the thoracic esophagus. Unremarkable appearance of the thoracic inlet and thyroid. Lungs/Pleura: Pattern of centrilobular nodules without significant interlobular septal thickening. No pleural effusion confluent airspace disease, or pneumothorax. Minimal atelectasis/scarring at the lung bases. No bronchial wall thickening or endobronchial debris. Musculoskeletal: No displaced fracture. Degenerative changes of the spine. Review of the MIP images confirms the above findings. CT ABDOMEN and PELVIS FINDINGS Hepatobiliary:  No focal liver abnormality is seen. No gallstones, gallbladder wall thickening, or biliary dilatation. Focal fatty infiltration at the falciform ligament. Pancreas: Unremarkable. No pancreatic ductal dilatation or surrounding inflammatory changes. Spleen: Normal in size without focal abnormality. Adrenals/Urinary Tract: Adrenal glands are unremarkable. Kidneys are normal, without renal calculi, or hydronephrosis. Unremarkable bladder. Low-density rounded cystic structures of the left kidney are most likely Bosniak 1 cysts. Stomach/Bowel: Unremarkable stomach. Unremarkable small bowel. No abnormally distended small bowel or colon. No transition point. Normal appendix. Moderate stool burden. Vascular/Lymphatic:  Calcifications of the abdominal aorta. No aneurysm. No periaortic fluid. Reproductive: Uterus and bilateral adnexa are unremarkable. Other: No abdominal wall hernia or abnormality. No abdominopelvic ascites. Musculoskeletal: No displaced fracture. Minimal degenerative changes of the spine. Review of the MIP images confirms the above findings. IMPRESSION: Chest: Study is negative for pulmonary emboli. Pattern of diffuse centrilobular ground-glass attenuation micronodularity throughout the lungs bilaterally. This is likely a manifestation of a smoking related disease such as RB (respiratory bronchiolitis), or if the patient is symptomatic, RB-ILD (respiratory bronchiolitis-interstitial lung disease). Abdomen: No acute abdominal finding. Aortic atherosclerosis. Signed, Dulcy Fanny. Earleen Newport, DO [facility] Electronically Signed   By: Corrie Mckusick D.O.   On: 07/08/2016 15:58    Procedures Procedures (including critical care time)  Medications Ordered in ED Medications  sodium chloride 0.9 % bolus 1,000 mL (0 mLs Intravenous Stopped 07/08/16 1359)  ondansetron (ZOFRAN) injection 4 mg (4 mg Intravenous Given 07/08/16 1209)  morphine 4 MG/ML injection 4 mg (4 mg Intravenous Given 07/08/16 1209)  sodium chloride 0.9 % bolus 1,000 mL (0 mLs Intravenous Stopped 07/08/16 1521)  morphine 4 MG/ML injection 4 mg (4 mg Intravenous Given 07/08/16 1516)  iopamidol (ISOVUE-370) 76 % injection 100 mL (100 mLs Intravenous Contrast Given 07/08/16 1531)  cefTRIAXone (ROCEPHIN) 1 g in dextrose 5 % 50 mL IVPB (0 g Intravenous Stopped 07/08/16 1633)     Initial Impression / Assessment and Plan / ED Course  I have reviewed the triage vital signs and the nursing notes.  Pertinent labs & imaging results that were available during my care of the patient were reviewed by me and considered in my medical decision making (see chart for details).  Clinical Course   Patient  presented to the ED with lower extremity numbness and weakness. On my exam patient had mild weakness in the lower shin is however sensation was intact. No loss of bowel or bladder control.  No concern for cauda equina.  No fever, night sweats, weight loss, h/o cancer, IVDU. Patient states that she has been laying in her bed for the past 2 days and unable to get out of bed. Her tachycardia is likely due to dehydration however given that she has had prolonged immobilization edema was ordered. Patient denies any cp, sob, cough. Her d-dimer was significantly elevated. Given the significant elevated d-dimer CTA of the chest was performed that showed no PE. Patient was seen and examined by Dr. Lita Mains who was concerned for pyelonephritis in this patient. He recommended CT abdomen as well. Patient had a UA with many bacteria but no leukocytes or nitrites. Culture was performed. Patient denies any urinary symptoms except for frequency. CT of abdomen and pelvis was without any acute abnormalities. A gram of Rocephin was given. Given patient's midline L-spine tenderness and lower extremity weakness without any other explanation for her pain and MRI of the lumbar spine was ordered. MRI was unremarkable. Patient with bilateral thigh pain but  no signs of erythema, ecchymosis, edema. No calf tenderness. Low suspicion for DVT at this time. RICE protocol and pain medicine indicated and discussed with patient. I have encouraged patient to follow up with a PCP and orthopedics. She is agreeable to the above plan. Dr. Lita Mains has seen the patient and agrees with the above plan. Patient was likely dehydrated. She was given 2 boluses with improvement in her tachycardia. She was hemoconcentrated on her labs. Pt is hemodynamically stable, in NAD, & able to ambulate in the ED. Pain has been managed & has no complaints prior to dc. Pt is comfortable with above plan and is stable for discharge at this time. All questions were answered  prior to disposition. Strict return precautions for f/u to the ED were discussed.    Final Clinical Impressions(s) / ED Diagnoses   Final diagnoses:  Numbness and tingling of both lower extremities  Paresthesia    New Prescriptions Discharge Medication List as of 07/08/2016  7:02 PM       Doristine Devoid, PA-C 07/10/16 1450    Julianne Rice, MD 07/12/16 1408

## 2016-07-08 NOTE — ED Notes (Signed)
Patient not in room

## 2016-07-08 NOTE — ED Triage Notes (Signed)
She c/o bilat. Leg "numbness and weakness" x 3 days. She also c/o chronic low back pain. She is in no distress.

## 2016-07-10 LAB — URINE CULTURE

## 2016-07-13 LAB — CULTURE, BLOOD (ROUTINE X 2)
Culture: NO GROWTH
Culture: NO GROWTH

## 2016-07-27 ENCOUNTER — Ambulatory Visit: Payer: Medicaid Other | Admitting: Obstetrics & Gynecology

## 2016-07-27 ENCOUNTER — Encounter: Payer: Self-pay | Admitting: Obstetrics & Gynecology

## 2016-07-27 VITALS — BP 130/88 | HR 86 | Ht 63.0 in | Wt 131.0 lb

## 2016-07-27 DIAGNOSIS — L02211 Cutaneous abscess of abdominal wall: Secondary | ICD-10-CM | POA: Insufficient documentation

## 2016-07-27 NOTE — Progress Notes (Signed)
Patient presented for management of abscess of abdominal skin.  No GYN or pelvic concerns. She was given referral to [facility] for further management.  Osborne Oman, MD

## 2016-07-31 ENCOUNTER — Emergency Department (HOSPITAL_COMMUNITY)
Admission: EM | Admit: 2016-07-31 | Discharge: 2016-07-31 | Disposition: A | Discharge status: Home / Self Care | Payer: Self-pay | Attending: Emergency Medicine | Admitting: Emergency Medicine

## 2016-07-31 ENCOUNTER — Encounter (HOSPITAL_COMMUNITY): Payer: Self-pay | Admitting: Emergency Medicine

## 2016-07-31 DIAGNOSIS — N898 Other specified noninflammatory disorders of vagina: Secondary | ICD-10-CM | POA: Insufficient documentation

## 2016-07-31 DIAGNOSIS — B373 Candidiasis of vulva and vagina: Secondary | ICD-10-CM | POA: Insufficient documentation

## 2016-07-31 DIAGNOSIS — Z7982 Long term (current) use of aspirin: Secondary | ICD-10-CM | POA: Insufficient documentation

## 2016-07-31 DIAGNOSIS — F17210 Nicotine dependence, cigarettes, uncomplicated: Secondary | ICD-10-CM | POA: Insufficient documentation

## 2016-07-31 DIAGNOSIS — Z79899 Other long term (current) drug therapy: Secondary | ICD-10-CM | POA: Insufficient documentation

## 2016-07-31 DIAGNOSIS — B3731 Acute candidiasis of vulva and vagina: Secondary | ICD-10-CM

## 2016-07-31 DIAGNOSIS — R202 Paresthesia of skin: Secondary | ICD-10-CM | POA: Insufficient documentation

## 2016-07-31 MED ORDER — FLUCONAZOLE 150 MG PO TABS
150.0000 mg | ORAL_TABLET | Freq: Once | ORAL | Status: AC
  Administered 2016-07-31: 150 mg via ORAL
  Filled 2016-07-31: qty 1

## 2016-07-31 NOTE — Discharge Instructions (Signed)
Alternate between tylenol and motrin as needed for pain. Alternate between ice and heat as needed for pain, no more than 20 minutes at a time every hour for each. You were given diflucan today for your vaginal itching/discharge. You can use over the counter monistat if symptoms don't resolve in the next 3 days. Follow up with your primary care physician for recheck of ongoing symptoms in the next 1-2 weeks; follow up with the neurosurgeon in the next 1-2 weeks for ongoing management of your nerve pain. Return to ER for emergent changing or worsening of symptoms.

## 2016-07-31 NOTE — ED Triage Notes (Addendum)
Pt reports ongoing leg burning worse on R anterior lower leg. No known injury. Was seen a few weeks ago for same. Symptoms only slightly better. Has appointment for PCP for Jan 3. Pt reports she was referred to neurology for leg pain, but was refused by practice.  Pt adds she had an abscess drained on her L abd, and is concerned that her abscess is causing her leg pain.

## 2016-07-31 NOTE — ED Provider Notes (Signed)
[facility] DEPT Provider Note   CSN: [id number] Arrival date & time: 07/31/16  1012     History   Chief Complaint Chief Complaint  Patient presents with  . Leg Pain  . Vaginal Itching    HPI Jill Mcfarland is a 45 y.o. female with a PMHx of recurrent abscesses, anxiety, depression, and chronic back pain, who presents to the ED with multiple complaints. Her primary complaint is ongoing right foot and lower leg pain, she was seen here on 07/08/16 for this issue, states it has improved slightly but continues to cause significant discomfort. Chart review reveals she was seen on 07/08/16 in the ER for b/l leg weakness/pain and low back pain that began 2 days prior, causing her to essentially lay in bed for 2 days without getting up or doing anything; had an extensive work up including CTA chest neg for PE, MRI lumbar spine which was reported as "normal L spine", CT abd/pelv with contrast which was negative. U/A dirty, but treated as UTI; UCx resulting with "multiple species", therefore likely contaminated. She was referred to a neurosurgeon Dr. Ronnald Ramp, states that she called him and his office refused to see her because of her MRI being negative. She [facility] on August 20 2016, but comes in today because of her ongoing leg pain. She describes her pain as 10/10 constant burning pain in the right foot and lower leg, radiating up to her right hip, worse with touching the area, and on improved with ibuprofen, Tylenol, Aleve, and Excedrin. Associated symptoms include nausea.  Additionally she mentions that she had an abscess lanced on 06/22/16, and is worried that because they told her she had an "infected cyst" that it could have "ruptured inside of her" causing her leg pain. She denies any ongoing swelling, erythema, warmth, or drainage from the I&D site. She also mentions that she was given Bactrim on 06/18/16 and was prescribed Diflucan with  it, but she's had ongoing vaginal itching and believes she needs another dose of Diflucan. Endorsing having white clumpy vaginal discharge and intense vaginal itching, which she feels is due to a yeast infection. Chart review reveals she was seen 06/18/16 for LLQ abd abscess, given bactrim w/ diflucan; then seen again on 06/22/16 and had bedside U/S done on the area by Dr. Jeneen Rinks, found to be an abscess, which was I&D'd.   She denies fevers, chills, CP, SOB, abd pain, vomiting, diarrhea, constipation, melena, hematochezia, hematuria, dysuria, vaginal bleeding, arthralgias, LE swelling, numbness, tingling, focal weakness, incontinence of urine/stool, saddle anesthesia or cauda equina symptoms, hx of CA or IVDU, leg skin changes, or ongoing drainage/warmth/swelling/redness to LLQ area where she had the I&D performed. Denies any other complaints at this time.     The history is provided by the patient and medical records. No language interpreter was used.  Leg Pain   This is a recurrent problem. The current episode started more than 1 week ago. The problem occurs constantly. The problem has been gradually improving. The pain is present in the right lower leg, right ankle, right foot and right toes. Quality: burning. The pain is at a severity of 10/10. The pain is severe. Pertinent negatives include no numbness, full range of motion and no tingling. The symptoms are aggravated by contact. She has tried OTC pain medications for the symptoms. The treatment provided no relief. There has been no history of extremity trauma.  Vaginal Itching  Pertinent  negatives include no chest pain, no abdominal pain and no shortness of breath.    Past Medical History:  Diagnosis Date  . Abscess   . Anxiety    History - no current prob per pt.  . Bartholin cyst   . Bronchitis   . Depression    History - no current prob per pt.    Patient Active Problem List   Diagnosis Date Noted  . Abscess of skin of abdomen  07/27/2016  . Bartholin cyst 02/14/2011  . Furuncle 02/14/2011  . Trichomonas 02/14/2011    Past Surgical History:  Procedure Laterality Date  . BARTHOLIN CYST MARSUPIALIZATION  12/03/2011   Procedure: BARTHOLIN CYST MARSUPIALIZATION;  Surgeon: Mora Bellman, MD;  Location: [facility];  Service: Gynecology;  Laterality: Left;  Bartholin Abcess  . CARPAL TUNNEL RELEASE    . Incison and drainage     Bartholin cyst  . LEEP    . SVD     x 3  . TUBAL LIGATION    . WISDOM TOOTH EXTRACTION      OB History    Gravida Para Term Preterm AB Living   4 3 3   1 2    SAB TAB Ectopic Multiple Live Births     1             Home Medications    Prior to Admission medications   Medication Sig Start Date End Date Taking? Authorizing Provider  acetaminophen (TYLENOL) 325 MG tablet Take 650 mg by mouth every 6 (six) hours as needed (pain).    Historical Provider, MD  Aspirin-Caffeine (BC FAST PAIN RELIEF PO) Take 1 packet by mouth daily as needed (headache).    Historical Provider, MD  cyclobenzaprine (FLEXERIL) 10 MG tablet Take 1 tablet (10 mg total) by mouth 3 (three) times daily as needed for muscle spasms. Patient not taking: Reported on 07/27/2016 02/08/16   Pierce Crane Beers, PA-C  fluconazole (DIFLUCAN) 150 MG tablet Take 1 tablet (150 mg total) by mouth daily. Patient not taking: Reported on 07/27/2016 06/18/16   [name], PA-C  ibuprofen (ADVIL,MOTRIN) 800 MG tablet Take 1 tablet (800 mg total) by mouth 3 (three) times daily. 06/18/16   [name], PA-C  oxyCODONE-acetaminophen (PERCOCET/ROXICET) 5-325 MG tablet Take 2 tablets by mouth every 4 (four) hours as needed. Patient not taking: Reported on 07/27/2016 06/22/16   [name], MD    Family History Family History  Problem Relation Age of Onset  . Cancer Mother   . Asthma Son     Social History Social History  Substance Use Topics  . Smoking status: Current Every Day Smoker    Packs/day: 0.50    Years: 24.00    Types:  Cigarettes  . Smokeless tobacco: Never Used  . Alcohol use No     Allergies   Tramadol and Vicodin [hydrocodone-acetaminophen]   Review of Systems Review of Systems  Constitutional: Negative for chills and fever.  Respiratory: Negative for shortness of breath.   Cardiovascular: Negative for chest pain.  Gastrointestinal: Positive for nausea. Negative for abdominal pain, blood in stool, constipation, diarrhea and vomiting.  Genitourinary: Positive for vaginal discharge and vaginal pain (itching). Negative for difficulty urinating (no incontinence), dysuria, hematuria and vaginal bleeding.  Musculoskeletal: Positive for myalgias (R lower leg/foot). Negative for arthralgias, back pain and joint swelling.  Skin: Negative for color change.  Allergic/Immunologic: Negative for immunocompromised state.  Neurological: Negative for tingling, weakness and numbness.  No saddle anesthesia/cauda equina symptoms  Psychiatric/Behavioral: Negative for confusion.   10 Systems reviewed and are negative for acute change except as noted in the HPI.   Physical Exam Updated Vital Signs BP 119/92   Pulse 108   Temp 98.6 F (37 C) (Oral)   Resp 16   LMP 06/26/2016 (Approximate)   SpO2 95%   Physical Exam  Constitutional: She is oriented to person, place, and time. Vital signs are normal. She appears well-developed and well-nourished.  Non-toxic appearance. No distress.  Afebrile, nontoxic, NAD although tearful at times during exam  HENT:  Head: Normocephalic and atraumatic.  Mouth/Throat: Oropharynx is clear and moist and mucous membranes are normal.  Eyes: Conjunctivae and EOM are normal. Right eye exhibits no discharge. Left eye exhibits no discharge.  Neck: Normal range of motion. Neck supple. No spinous process tenderness and no muscular tenderness present. No neck rigidity. Normal range of motion present.  FROM intact without spinous process TTP, no bony stepoffs or deformities, no  paraspinous muscle TTP or muscle spasms. No rigidity or meningeal signs. No bruising or swelling.   Cardiovascular: Normal rate, regular rhythm, normal heart sounds and intact distal pulses.  Exam reveals no gallop and no friction rub.   No murmur heard. Tachycardic in triage, which resolved during exam  Pulmonary/Chest: Effort normal and breath sounds normal. No respiratory distress. She has no decreased breath sounds. She has no wheezes. She has no rhonchi. She has no rales.  Abdominal: Soft. Normal appearance and bowel sounds are normal. She exhibits no distension. There is no tenderness. There is no rigidity, no rebound, no guarding, no CVA tenderness, no tenderness at McBurney's point and negative Murphy's sign.    Soft, NTND, +BS throughout, no r/g/r, neg murphy's, neg mcburney's, no CVA TTP  Scabbed I&D site to LLQ, surrounding excoriations causing some mild erythema, but no warmth/swelling, no drainage, no fluctuance or induration, no evidence of abscess or cellulitis. Skin around I&D site mildly tender, but no other focal area of tenderness.   Musculoskeletal: Normal range of motion.       Cervical back: Normal.       Thoracic back: Normal.       Lumbar back: She exhibits tenderness and spasm. She exhibits normal range of motion, no bony tenderness and no deformity.       Back:  All spinal levels with FROM intact without spinous process TTP, no bony stepoffs or deformities, with mild R sided lumbar paraspinous muscle TTP and muscle spasms.  R lower leg without erythema/warmth/swelling, skin to lower leg and foot hyperalgesic and diffusely tender, but without focal bony TTP.  Strength and sensation grossly intact in all extremities, gait steady and nonantalgic. No overlying skin changes to back or b/l legs. Distal pulses intact. Compartments soft. No pedal edema.   Neurological: She is alert and oriented to person, place, and time. She has normal strength. No sensory deficit.  Skin: Skin  is warm, dry and intact. No rash noted.  Psychiatric: She has a normal mood and affect.  Nursing note and vitals reviewed.    ED Treatments / Results  Labs (all labs ordered are listed, but only abnormal results are displayed) Labs Reviewed - No data to display Results for orders placed or performed during the hospital encounter of 07/08/16  Culture, blood (routine x 2)  Result Value Ref Range   Specimen Description BLOOD RIGHT ANTECUBITAL    Special Requests BOTTLES DRAWN AEROBIC AND ANAEROBIC 10CC  Culture      NO GROWTH 5 DAYS Performed at [facility]    Report Status 07/13/2016 FINAL   Culture, blood (routine x 2)  Result Value Ref Range   Specimen Description BLOOD LEFT ANTECUBITAL    Special Requests BOTTLES DRAWN AEROBIC AND ANAEROBIC 10CC    Culture      NO GROWTH 5 DAYS Performed at [facility]    Report Status 07/13/2016 FINAL   Urine culture  Result Value Ref Range   Specimen Description URINE, RANDOM    Special Requests NONE    Culture MULTIPLE SPECIES PRESENT, SUGGEST RECOLLECTION (A)    Report Status 07/10/2016 FINAL   Basic metabolic panel  Result Value Ref Range   Sodium 136 135 - 145 mmol/L   Potassium 3.7 3.5 - 5.1 mmol/L   Chloride 99 (L) 101 - 111 mmol/L   CO2 28 22 - 32 mmol/L   Glucose, Bld 93 65 - 99 mg/dL   BUN 16 6 - 20 mg/dL   Creatinine, Ser 0.63 0.44 - 1.00 mg/dL   Calcium 9.2 8.9 - 10.3 mg/dL   GFR calc non Af Amer >60 >60 mL/min   GFR calc Af Amer >60 >60 mL/min   Anion gap 9 5 - 15  CBC with Differential  Result Value Ref Range   WBC 12.2 (H) 4.0 - 10.5 K/uL   RBC 5.80 (H) 3.87 - 5.11 MIL/uL   Hemoglobin 18.4 (H) 12.0 - 15.0 g/dL   HCT 52.2 (H) 36.0 - 46.0 %   MCV 90.0 78.0 - 100.0 fL   MCH 31.7 26.0 - 34.0 pg   MCHC 35.2 30.0 - 36.0 g/dL   RDW 13.1 11.5 - 15.5 %   Platelets 234 150 - 400 K/uL   Neutrophils Relative % 65 %   Neutro Abs 8.0 (H) 1.7 - 7.7 K/uL   Lymphocytes Relative 28 %   Lymphs Abs 3.4 0.7  - 4.0 K/uL   Monocytes Relative 7 %   Monocytes Absolute 0.8 0.1 - 1.0 K/uL   Eosinophils Relative 0 %   Eosinophils Absolute 0.0 0.0 - 0.7 K/uL   Basophils Relative 0 %   Basophils Absolute 0.0 0.0 - 0.1 K/uL  D-dimer, quantitative  Result Value Ref Range   D-Dimer, Quant 1.23 (H) 0.00 - 0.50 ug/mL-FEU  Urinalysis, Routine w reflex microscopic  Result Value Ref Range   Color, Urine AMBER (A) YELLOW   APPearance CLOUDY (A) CLEAR   Specific Gravity, Urine 1.017 1.005 - 1.030   pH 6.0 5.0 - 8.0   Glucose, UA NEGATIVE NEGATIVE mg/dL   Hgb urine dipstick NEGATIVE NEGATIVE   Bilirubin Urine SMALL (A) NEGATIVE   Ketones, ur NEGATIVE NEGATIVE mg/dL   Protein, ur NEGATIVE NEGATIVE mg/dL   Nitrite NEGATIVE NEGATIVE   Leukocytes, UA SMALL (A) NEGATIVE  Urine microscopic-add on  Result Value Ref Range   Squamous Epithelial / LPF 6-30 (A) NONE SEEN   WBC, UA 0-5 0 - 5 WBC/hpf   RBC / HPF 0-5 0 - 5 RBC/hpf   Bacteria, UA FEW (A) NONE SEEN  I-Stat CG4 Lactic Acid, ED  Result Value Ref Range   Lactic Acid, Venous 1.39 0.5 - 1.9 mmol/L  POC urine preg, ED  Result Value Ref Range   Preg Test, Ur NEGATIVE NEGATIVE    EKG  EKG Interpretation None       Radiology No results found. Dg Chest 2 View  Result Date: 07/08/2016 CLINICAL DATA:  Leg numbness.  Elevated D-dimer EXAM: CHEST  2 VIEW COMPARISON:  05/26/2014 FINDINGS: Heart and mediastinal contours are within normal limits. No focal opacities or effusions. No acute bony abnormality. IMPRESSION: No active disease. Electronically Signed   By: Rolm Baptise M.D.   On: 07/08/2016 13:32   Ct Angio Chest Pe W/cm &/or Wo Cm  Result Date: 07/08/2016 CLINICAL DATA:  45 year old female with leg numbness and weakness for 3 days. Chronic back pain. EXAM: CT ANGIOGRAPHY CHEST CT ABDOMEN AND PELVIS WITH CONTRAST TECHNIQUE: Multidetector CT imaging of the chest was performed using the standard protocol during bolus administration of intravenous  contrast. Multiplanar CT image reconstructions and MIPs were obtained to evaluate the vascular anatomy. Multidetector CT imaging of the abdomen and pelvis was performed using the standard protocol during bolus administration of intravenous contrast. CONTRAST:  One hundred cc Isovue 370 COMPARISON:  None. FINDINGS: CTA CHEST FINDINGS Cardiovascular: Heart: No cardiomegaly. No pericardial fluid/thickening. No significant coronary calcifications. Aorta: Unremarkable course, caliber, contour of the thoracic aorta. No aneurysm or dissection flap. No periaortic fluid. Pulmonary arteries: No central, lobar, segmental, or proximal subsegmental filling defects. Mediastinum/Nodes: Mediastinal lymph nodes are present, none of which are enlarged by CT size criteria. Unremarkable appearance of the thoracic esophagus. Unremarkable appearance of the thoracic inlet and thyroid. Lungs/Pleura: Pattern of centrilobular nodules without significant interlobular septal thickening. No pleural effusion confluent airspace disease, or pneumothorax. Minimal atelectasis/scarring at the lung bases. No bronchial wall thickening or endobronchial debris. Musculoskeletal: No displaced fracture. Degenerative changes of the spine. Review of the MIP images confirms the above findings. CT ABDOMEN and PELVIS FINDINGS Hepatobiliary: No focal liver abnormality is seen. No gallstones, gallbladder wall thickening, or biliary dilatation. Focal fatty infiltration at the falciform ligament. Pancreas: Unremarkable. No pancreatic ductal dilatation or surrounding inflammatory changes. Spleen: Normal in size without focal abnormality. Adrenals/Urinary Tract: Adrenal glands are unremarkable. Kidneys are normal, without renal calculi, or hydronephrosis. Unremarkable bladder. Low-density rounded cystic structures of the left kidney are most likely Bosniak 1 cysts. Stomach/Bowel: Unremarkable stomach. Unremarkable small bowel. No abnormally distended small bowel or  colon. No transition point. Normal appendix. Moderate stool burden. Vascular/Lymphatic: Calcifications of the abdominal aorta. No aneurysm. No periaortic fluid. Reproductive: Uterus and bilateral adnexa are unremarkable. Other: No abdominal wall hernia or abnormality. No abdominopelvic ascites. Musculoskeletal: No displaced fracture. Minimal degenerative changes of the spine. Review of the MIP images confirms the above findings. IMPRESSION: Chest: Study is negative for pulmonary emboli. Pattern of diffuse centrilobular ground-glass attenuation micronodularity throughout the lungs bilaterally. This is likely a manifestation of a smoking related disease such as RB (respiratory bronchiolitis), or if the patient is symptomatic, RB-ILD (respiratory bronchiolitis-interstitial lung disease). Abdomen: No acute abdominal finding. Aortic atherosclerosis. Signed, Dulcy Fanny. Earleen Newport, DO [facility] Electronically Signed   By: Corrie Mckusick D.O.   On: 07/08/2016 15:58   Mr Lumbar Spine Wo Contrast  Result Date: 07/08/2016 CLINICAL DATA:  Bilateral leg numbness and weakness for 3 days. Chronic low back pain. EXAM: MRI LUMBAR SPINE WITHOUT CONTRAST TECHNIQUE: Multiplanar, multisequence MR imaging of the lumbar spine was performed. No intravenous contrast was administered. COMPARISON:  CT scan of the abdomen and pelvis dated 07/08/2016 FINDINGS: Segmentation:  Normal. Alignment:  Normal. Vertebrae:  Normal. Conus medullaris: Extends to the L2 level and appears normal. Paraspinal and other soft tissues: Normal. Disc levels: T11-12 through L5-S1: The discs are normal. There is no disc bulge, protrusion, desiccation, or other abnormality. IMPRESSION: Normal  MRI of the lumbar spine. Electronically Signed   By: Lorriane Shire M.D.   On: 07/08/2016 18:29   Ct Abdomen Pelvis W Contrast  Result Date: 07/08/2016 CLINICAL DATA:  46 year old female with leg numbness and  weakness for 3 days. Chronic back pain. EXAM: CT ANGIOGRAPHY CHEST CT ABDOMEN AND PELVIS WITH CONTRAST TECHNIQUE: Multidetector CT imaging of the chest was performed using the standard protocol during bolus administration of intravenous contrast. Multiplanar CT image reconstructions and MIPs were obtained to evaluate the vascular anatomy. Multidetector CT imaging of the abdomen and pelvis was performed using the standard protocol during bolus administration of intravenous contrast. CONTRAST:  One hundred cc Isovue 370 COMPARISON:  None. FINDINGS: CTA CHEST FINDINGS Cardiovascular: Heart: No cardiomegaly. No pericardial fluid/thickening. No significant coronary calcifications. Aorta: Unremarkable course, caliber, contour of the thoracic aorta. No aneurysm or dissection flap. No periaortic fluid. Pulmonary arteries: No central, lobar, segmental, or proximal subsegmental filling defects. Mediastinum/Nodes: Mediastinal lymph nodes are present, none of which are enlarged by CT size criteria. Unremarkable appearance of the thoracic esophagus. Unremarkable appearance of the thoracic inlet and thyroid. Lungs/Pleura: Pattern of centrilobular nodules without significant interlobular septal thickening. No pleural effusion confluent airspace disease, or pneumothorax. Minimal atelectasis/scarring at the lung bases. No bronchial wall thickening or endobronchial debris. Musculoskeletal: No displaced fracture. Degenerative changes of the spine. Review of the MIP images confirms the above findings. CT ABDOMEN and PELVIS FINDINGS Hepatobiliary: No focal liver abnormality is seen. No gallstones, gallbladder wall thickening, or biliary dilatation. Focal fatty infiltration at the falciform ligament. Pancreas: Unremarkable. No pancreatic ductal dilatation or surrounding inflammatory changes. Spleen: Normal in size without focal abnormality. Adrenals/Urinary Tract: Adrenal glands are unremarkable. Kidneys are normal, without renal  calculi, or hydronephrosis. Unremarkable bladder. Low-density rounded cystic structures of the left kidney are most likely Bosniak 1 cysts. Stomach/Bowel: Unremarkable stomach. Unremarkable small bowel. No abnormally distended small bowel or colon. No transition point. Normal appendix. Moderate stool burden. Vascular/Lymphatic: Calcifications of the abdominal aorta. No aneurysm. No periaortic fluid. Reproductive: Uterus and bilateral adnexa are unremarkable. Other: No abdominal wall hernia or abnormality. No abdominopelvic ascites. Musculoskeletal: No displaced fracture. Minimal degenerative changes of the spine. Review of the MIP images confirms the above findings. IMPRESSION: Chest: Study is negative for pulmonary emboli. Pattern of diffuse centrilobular ground-glass attenuation micronodularity throughout the lungs bilaterally. This is likely a manifestation of a smoking related disease such as RB (respiratory bronchiolitis), or if the patient is symptomatic, RB-ILD (respiratory bronchiolitis-interstitial lung disease). Abdomen: No acute abdominal finding. Aortic atherosclerosis. Signed, Dulcy Fanny. Earleen Newport, DO [facility] Electronically Signed   By: Corrie Mckusick D.O.   On: 07/08/2016 15:58     Procedures Procedures (including critical care time)  Medications Ordered in ED Medications  fluconazole (DIFLUCAN) tablet 150 mg (not administered)     Initial Impression / Assessment and Plan / ED Course  I have reviewed the triage vital signs and the nursing notes.  Pertinent labs & imaging results that were available during my care of the patient were reviewed by me and considered in my medical decision making (see chart for details).  Clinical Course     45 y.o. female here with ongoing R leg paresthesias, slightly better than last visit but still bothersome. Had extensive work up on 07/08/16 for this, including MRI Lspine which was negative,  CTA chest/abd/pelv which was essentially negative aside from some ground-glass attenuation likely from smoking. Had U/A that appeared contaminated, was treated  for UTI but UCx came back with multiple species so it's likely it was contaminated. She also c/o vaginal itching since having bactrim on 06/18/16 (was given diflucan but it didn't help). Also concerned that an abscess on her abdomen wall has "ruptured inside of her" and is causing the issues she comes in for today. States she tried to see neurosurgeon but they refused to see her due to the MRI being normal. On exam, healed crusted I&D site to LLQ, surrounding excoriations making skin mildly erythematous but no warmth/swelling/fluctuance, no evidence of abscess or cellulitis. Abdominal exam otherwise benign. Mild R sided lumbar paraspinous muscle TTP, no midline spinal TTP. MAE x4, strength and sensation grossly intact. No LE swelling/skin changes. Symptoms most consistent with neuropathic pain, unclear etiology, but given extensive work up done recently, doubt need for further emergent work up today. Likely would benefit from outpatient neurology (NOT neurosurgery) visit, possibly for nerve conduction study to see where this issue is stemming from. Regarding the vaginal itching, will give diflucan, but will hold off on pelvic exam today given it's not emergently indicated. No evidence of abscess to abd, doubt need for abx today, doubt need for further imaging/labs. Discussed that she likely would benefit from gabapentin, but will defer to PCP for starting this, in order to monitor her closely. Doubt narcotics would help, in fact I feel they'd be more harmful; discussed with pt potential for hyperalgesia, etc. Discussed use of tylenol/motrin for pain, ice/heat use. F/up with PCP and neurology in 5-7 days for recheck and ongoing management. I explained the diagnosis and have given explicit precautions to return to the ER including for any other new or worsening  symptoms. The patient understands and accepts the medical plan as it's been dictated and I have answered their questions. Discharge instructions concerning home care and prescriptions have been given. The patient is STABLE and is discharged to home in good condition.   Final Clinical Impressions(s) / ED Diagnoses   Final diagnoses:  Paresthesias  Vaginal itching  Vaginal yeast infection    New Prescriptions New Prescriptions   No medications on file     Zacarias Pontes, PA-C 07/31/16 1126    Jola Schmidt, MD 07/31/16 1501

## 2016-07-31 NOTE — ED Notes (Signed)
Pt add she has a yeast infection that has not improved with diflucan

## 2016-07-31 NOTE — ED Notes (Signed)
ED Provider at bedside. 

## 2016-08-12 ENCOUNTER — Encounter (HOSPITAL_COMMUNITY): Payer: Self-pay | Admitting: Emergency Medicine

## 2016-08-12 ENCOUNTER — Emergency Department (HOSPITAL_COMMUNITY)
Admission: EM | Admit: 2016-08-12 | Discharge: 2016-08-12 | Disposition: A | Discharge status: Home / Self Care | Payer: Self-pay | Attending: Emergency Medicine | Admitting: Emergency Medicine

## 2016-08-12 DIAGNOSIS — F17210 Nicotine dependence, cigarettes, uncomplicated: Secondary | ICD-10-CM | POA: Insufficient documentation

## 2016-08-12 DIAGNOSIS — M79605 Pain in left leg: Secondary | ICD-10-CM | POA: Insufficient documentation

## 2016-08-12 DIAGNOSIS — Z7982 Long term (current) use of aspirin: Secondary | ICD-10-CM | POA: Insufficient documentation

## 2016-08-12 DIAGNOSIS — M79661 Pain in right lower leg: Secondary | ICD-10-CM | POA: Insufficient documentation

## 2016-08-12 DIAGNOSIS — M79604 Pain in right leg: Secondary | ICD-10-CM

## 2016-08-12 LAB — CBG MONITORING, ED: Glucose-Capillary: 90 mg/dL (ref 65–99)

## 2016-08-12 MED ORDER — GABAPENTIN 100 MG PO CAPS: 100.0000 mg | ORAL_CAPSULE | Freq: Every day | ORAL | 0 refills | Status: DC

## 2016-08-12 NOTE — ED Triage Notes (Signed)
Pt. Stated, My rt. Leg and foot has been hurting up into my butt since Thanksgiving

## 2016-08-12 NOTE — ED Provider Notes (Signed)
[facility] DEPT Provider Note    By signing my name below, I, Jill Mcfarland, attest that this documentation has been prepared under the direction and in the presence of Jill Carnes, PA-C. Electronically Signed: Bea Mcfarland, ED Scribe. 08/12/16. 2:26 PM.   History   Chief Complaint Chief Complaint  Patient presents with  . Leg Pain     The history is provided by the patient and medical records. No language interpreter was used.    HPI Comments:  Jill Mcfarland is a 45 y.o. female with PMHx of chronic back pain who presents to the Emergency Department complaining of bilateral leg pain, right worse than left, that has been worsening in the past month but started suddenly upon waking one morning.   She states the pain starts in her foot and radiates up the leg to her buttocks. She Was seen here on 07/08/2016 and had an extensive workup including CTA of the chest, CT abdomen pelvis with contrast, as well as MRI of the lumbar spine, none of which with any explanation for her pain. She has not taken anything for pain because it doesn't help. Wearing socks, shoes or touching the leg increases the pain. She denies alleviating factors. She denies fever, chills, nausea, vomiting, numbness, tingling or weakness of the lower extremities, bruising, wounds, rashes. No bowel or bladder incontinence.  no urinary symptoms.  No abdominal pain.  She does not have a PCP. She denies trauma, injury or fall. She denies h/o DM but was told she was borderline several years ago. She denies any past surgeries to the lower extremities but reports fracturing bones in both feet in the past.    Past Medical History:  Diagnosis Date  . Abscess   . Anxiety    History - no current prob per pt.  . Bartholin cyst   . Bronchitis   . Depression    History - no current prob per pt.    Patient Active Problem List   Diagnosis Date Noted  . Abscess of skin of abdomen 07/27/2016  . Bartholin cyst 02/14/2011  .  Furuncle 02/14/2011  . Trichomonas 02/14/2011    Past Surgical History:  Procedure Laterality Date  . BARTHOLIN CYST MARSUPIALIZATION  12/03/2011   Procedure: BARTHOLIN CYST MARSUPIALIZATION;  Surgeon: Mora Bellman, MD;  Location: [facility];  Service: Gynecology;  Laterality: Left;  Bartholin Abcess  . CARPAL TUNNEL RELEASE    . Incison and drainage     Bartholin cyst  . LEEP    . SVD     x 3  . TUBAL LIGATION    . WISDOM TOOTH EXTRACTION      OB History    Gravida Para Term Preterm AB Living   4 3 3   1 2    SAB TAB Ectopic Multiple Live Births     1             Home Medications    Prior to Admission medications   Medication Sig Start Date End Date Taking? Authorizing Provider  acetaminophen (TYLENOL) 325 MG tablet Take 650 mg by mouth every 6 (six) hours as needed (pain).    Historical Provider, MD  Aspirin-Caffeine (BC FAST PAIN RELIEF PO) Take 1 packet by mouth daily as needed (headache).    Historical Provider, MD  fluconazole (DIFLUCAN) 150 MG tablet Take 1 tablet (150 mg total) by mouth daily. Patient not taking: Reported on 07/31/2016 06/18/16   [name], PA-C  ibuprofen (ADVIL,MOTRIN) 200 MG tablet  Take 200-800 mg by mouth every 6 (six) hours as needed for headache or moderate pain.    Historical Provider, MD  ibuprofen (ADVIL,MOTRIN) 800 MG tablet Take 1 tablet (800 mg total) by mouth 3 (three) times daily. Patient not taking: Reported on 07/31/2016 06/18/16   [name], PA-C  oxyCODONE-acetaminophen (PERCOCET/ROXICET) 5-325 MG tablet Take 2 tablets by mouth every 4 (four) hours as needed. Patient not taking: Reported on 07/31/2016 06/22/16   [name], MD    Family History Family History  Problem Relation Age of Onset  . Cancer Mother   . Asthma Son     Social History Social History  Substance Use Topics  . Smoking status: Current Every Day Smoker    Packs/day: 0.50    Years: 24.00    Types: Cigarettes  . Smokeless tobacco: Never Used  .  Alcohol use No     Allergies   Tramadol and Vicodin [hydrocodone-acetaminophen]   Review of Systems Review of Systems  Constitutional: Negative for chills and fever.  Gastrointestinal: Negative for nausea and vomiting.  Musculoskeletal: Positive for arthralgias and myalgias.  Skin: Negative for color change, rash and wound.  Neurological: Negative for weakness and numbness.  All other systems reviewed and are negative.    Physical Exam Updated Vital Signs BP 127/99 (BP Location: Right Arm)   Pulse 109   Temp 98.5 F (36.9 C) (Oral)   Resp 20   Ht 5\' 2"  (1.575 m)   Wt 128 lb (58.1 kg)   LMP 06/12/2016   SpO2 98%   BMI 23.41 kg/m   Physical Exam  Constitutional: She is oriented to person, place, and time. She appears well-developed and well-nourished.  HENT:  Head: Normocephalic and atraumatic.  Mouth/Throat: Oropharynx is clear and moist.  Eyes: Conjunctivae and EOM are normal. Pupils are equal, round, and reactive to light.  Neck: Normal range of motion.  Cardiovascular: Normal rate, regular rhythm and normal heart sounds.   Pulmonary/Chest: Effort normal and breath sounds normal.  Abdominal: Soft. Bowel sounds are normal.  Musculoskeletal: Normal range of motion.  Both legs and feet are normal in appearance; no swelling or bony deformities; no overlying skin changes, warmth to touch, or rash; full ROM of hip, knees, ankles, and toes of both legs; normal strength and sensation of both legs; DP pulse and cap refill normal in both feet and ankles; ambulatory with steady gait  Neurological: She is alert and oriented to person, place, and time.  Skin: Skin is warm and dry.  Psychiatric: She has a normal mood and affect.  Nursing note and vitals reviewed.    ED Treatments / Results  DIAGNOSTIC STUDIES: Oxygen Saturation is 98% on RA, normal by my interpretation.   COORDINATION OF CARE: 1:54 PM- Will speak with Dr. Jeneen Rinks about appropriate plan of care. Pt verbalizes  understanding and agrees to plan.  Medications - No data to display  Labs (all labs ordered are listed, but only abnormal results are displayed) Labs Reviewed  CBG MONITORING, ED    EKG  EKG Interpretation None       Radiology No results found.  Procedures Procedures (including critical care time)  Medications Ordered in ED Medications - No data to display   Initial Impression / Assessment and Plan / ED Course  I have reviewed the triage vital signs and the nursing notes.  Pertinent labs & imaging results that were available during my care of the patient were reviewed by me and considered in  my medical decision making (see chart for details).  Clinical Course    45 year old female here with right leg pain. This seems to be an ongoing issue since November of this year. She's not had any change in her pain, rather remains persistent.  She's not had any new injuries, trauma, or falls.  On exam she is afebrile and nontoxic. Her legs are normal in appearance without any swelling, bony deformities, overlying skin changes, rash, erythema, warmth to touch, length discrepancy, pulse discrepancy, or altered sensation. He has no physiologic weakness. She is ambulatory with a steady gait. She is not having any symptoms or physical exam findings suggestive of DVT or claudication. Her symptoms seem to be more related to a neuropathy type issue. Her CBG is normal here. She has already had a negative MRI last month, do not see an indication for other imaging studies at this time.  Will give trial of Neurontin. She has previous schedule PCP follow-up on 08/20/2016 that I have encouraged her to keep for close re-check.  Discussed plan with patient, she acknowledged understanding and agreed with plan of care.  Return precautions given for new or worsening symptoms.  Final Clinical Impressions(s) / ED Diagnoses   Final diagnoses:  Pain of right lower extremity    New Prescriptions Discharge  Medication List as of 08/12/2016  2:31 PM    START taking these medications   Details  gabapentin (NEURONTIN) 100 MG capsule Take 1 capsule (100 mg total) by mouth at bedtime., Starting Wed 08/12/2016, Print       I personally performed the services described in this documentation, which was scribed in my presence. The recorded information has been reviewed and is accurate.   Larene Pickett, PA-C 08/12/16 [place], MD 08/20/16 [phone]

## 2016-08-12 NOTE — Discharge Instructions (Signed)
Take the prescribed medication as directed.  This medication will likely need to be increased but should be done by your primary care doctor over the next 2 weeks. Follow-up with your doctor on Jan 4th at your scheduled appt. Return to the ED for new or worsening symptoms.

## 2016-08-20 ENCOUNTER — Ambulatory Visit: Payer: Self-pay | Admitting: Family Medicine

## 2016-09-03 ENCOUNTER — Ambulatory Visit: Payer: Self-pay | Admitting: Family Medicine

## 2016-09-04 ENCOUNTER — Ambulatory Visit (INDEPENDENT_AMBULATORY_CARE_PROVIDER_SITE_OTHER): Payer: Self-pay | Admitting: Family Medicine

## 2016-09-04 ENCOUNTER — Encounter: Payer: Self-pay | Admitting: Family Medicine

## 2016-09-04 VITALS — BP 140/85 | HR 92 | Temp 98.0°F | Resp 16 | Ht 62.0 in | Wt 122.0 lb

## 2016-09-04 DIAGNOSIS — G629 Polyneuropathy, unspecified: Secondary | ICD-10-CM | POA: Insufficient documentation

## 2016-09-04 DIAGNOSIS — F17200 Nicotine dependence, unspecified, uncomplicated: Secondary | ICD-10-CM

## 2016-09-04 DIAGNOSIS — R634 Abnormal weight loss: Secondary | ICD-10-CM

## 2016-09-04 DIAGNOSIS — Z23 Encounter for immunization: Secondary | ICD-10-CM

## 2016-09-04 LAB — CBC WITH DIFFERENTIAL/PLATELET
Basophils Absolute: 0 cells/uL (ref 0–200)
Basophils Relative: 0 %
Eosinophils Absolute: 0 cells/uL — ABNORMAL LOW (ref 15–500)
Eosinophils Relative: 0 %
HCT: 45.8 % — ABNORMAL HIGH (ref 35.0–45.0)
Hemoglobin: 15.1 g/dL (ref 11.7–15.5)
Lymphocytes Relative: 26 %
Lymphs Abs: 2548 cells/uL (ref 850–3900)
MCH: 30.8 pg (ref 27.0–33.0)
MCHC: 33 g/dL (ref 32.0–36.0)
MCV: 93.3 fL (ref 80.0–100.0)
MPV: 10.1 fL (ref 7.5–12.5)
Monocytes Absolute: 490 cells/uL (ref 200–950)
Monocytes Relative: 5 %
Neutro Abs: 6762 cells/uL (ref 1500–7800)
Neutrophils Relative %: 69 %
Platelets: 276 10*3/uL (ref 140–400)
RBC: 4.91 MIL/uL (ref 3.80–5.10)
RDW: 13.7 % (ref 11.0–15.0)
WBC: 9.8 10*3/uL (ref 3.8–10.8)

## 2016-09-04 LAB — COMPLETE METABOLIC PANEL WITH GFR
ALT: 11 U/L (ref 6–29)
AST: 11 U/L (ref 10–30)
Albumin: 4.2 g/dL (ref 3.6–5.1)
Alkaline Phosphatase: 69 U/L (ref 33–115)
BUN: 17 mg/dL (ref 7–25)
CO2: 27 mmol/L (ref 20–31)
Calcium: 9.4 mg/dL (ref 8.6–10.2)
Chloride: 106 mmol/L (ref 98–110)
Creat: 0.57 mg/dL (ref 0.50–1.10)
GFR, Est African American: >89 mL/min (ref >=60)
GFR, Est Non African American: >89 mL/min (ref >=60)
Glucose, Bld: 90 mg/dL (ref 65–99)
Potassium: 4 mmol/L (ref 3.5–5.3)
Sodium: 141 mmol/L (ref 135–146)
Total Bilirubin: 0.3 mg/dL (ref 0.2–1.2)
Total Protein: 7 g/dL (ref 6.1–8.1)

## 2016-09-04 LAB — TSH: TSH: 0.77 mIU/L

## 2016-09-04 LAB — POCT URINALYSIS DIP (DEVICE)
Glucose, UA: NEGATIVE mg/dL
Ketones, ur: NEGATIVE mg/dL
Leukocytes, UA: NEGATIVE
Nitrite: NEGATIVE
Protein, ur: NEGATIVE mg/dL
Specific Gravity, Urine: 1.025 (ref 1.005–1.030)
Urobilinogen, UA: 1 mg/dL (ref 0.0–1.0)
pH: 6 (ref 5.0–8.0)

## 2016-09-04 LAB — POCT GLYCOSYLATED HEMOGLOBIN (HGB A1C): Hemoglobin A1C: 5.5

## 2016-09-04 MED ORDER — GABAPENTIN 300 MG PO CAPS: 300.0000 mg | ORAL_CAPSULE | Freq: Three times a day (TID) | ORAL | 3 refills | Status: DC

## 2016-09-04 MED ORDER — ACETAMINOPHEN 500 MG PO TABS: 500.0000 mg | ORAL_TABLET | Freq: Four times a day (QID) | ORAL | 0 refills | Status: AC | PRN

## 2016-09-04 NOTE — Progress Notes (Signed)
Subjective:    Patient ID: Jill Mcfarland, female    DOB: Feb 01, 1971, 46 y.o.   MRN: [id number]  HPI  [name], a 46 year old female with a history of neuropathy presents to establish care. Patient says that she has not had a primary provider and has been using the emergency department for all primary care needs. Onset of symptoms was gradual, starting about 6 months ago. Symptoms are currently of moderate severity. Symptoms occur intermittently and last hours. The patient denies weakness, recent falls, headaches, memory loss. She endorses fatigue and a 20 pound weight loss over the past 6 months. Symptoms are primarily to left lower extremity.   Past Medical History:  Diagnosis Date  . Abscess   . Anxiety    History - no current prob per pt.  . Bartholin cyst   . Bronchitis   . Depression    History - no current prob per pt.   Social History   Social History  . Marital status: Single    Spouse name: N/A  . Number of children: N/A  . Years of education: N/A   Occupational History  . Not on file.   Social History Main Topics  . Smoking status: Current Every Day Smoker    Packs/day: 0.50    Years: 24.00    Types: Cigarettes  . Smokeless tobacco: Never Used  . Alcohol use No  . Drug use: No  . Sexual activity: Not Currently   Other Topics Concern  . Not on file   Social History Narrative  . No narrative on file   Immunization History  Administered Date(s) Administered  . Influenza,inj,Quad PF,36+ Mos 09/04/2016  . Pneumococcal Polysaccharide-23 09/04/2016   Review of Systems  Constitutional: Negative.  Negative for fatigue, fever and unexpected weight change.  HENT: Negative.   Eyes: Negative.   Respiratory: Negative.   Cardiovascular: Negative.  Negative for chest pain, palpitations and leg swelling.  Gastrointestinal: Negative.   Endocrine: Negative.  Negative for polydipsia, polyphagia and polyuria.  Genitourinary: Negative.   Musculoskeletal:  Negative.   Skin: Negative.   Allergic/Immunologic: Negative.  Negative for immunocompromised state.  Neurological: Positive for numbness. Negative for weakness.  Hematological: Negative.   Psychiatric/Behavioral: Negative.        Objective:   Physical Exam  Constitutional: She is oriented to person, place, and time. She appears well-developed and well-nourished.  HENT:  Head: Normocephalic and atraumatic.  Right Ear: External ear normal.  Left Ear: External ear normal.  Nose: Nose normal.  Mouth/Throat: Oropharynx is clear and moist.  Eyes: Conjunctivae and EOM are normal. Pupils are equal, round, and reactive to light.  Neck: Normal range of motion. Neck supple.  Cardiovascular: Normal rate, regular rhythm, normal heart sounds and intact distal pulses.   Pulmonary/Chest: Effort normal and breath sounds normal.  Abdominal: Soft. Bowel sounds are normal.  Musculoskeletal: Normal range of motion.  Neurological: She is alert and oriented to person, place, and time. She has normal reflexes.  Skin: Skin is warm and dry.  Psychiatric: She has a normal mood and affect. Her behavior is normal. Judgment and thought content normal.      BP 140/85 (BP Location: Right Arm, Patient Position: Sitting)   Pulse 92   Temp 98 F (36.7 C) (Oral)   Resp 16   Ht 5\' 2"  (1.575 m)   Wt 122 lb (55.3 kg)   LMP 09/03/2016   SpO2 99%   BMI 22.31 kg/m  Assessment &  Plan:  1. Neuropathy (HCC) - gabapentin (NEURONTIN) 300 MG capsule; Take 1 capsule (300 mg total) by mouth 3 (three) times daily.  Dispense: 90 capsule; Refill: 3 - CBC with Differential - HIV antibody (with reflex) - COMPLETE METABOLIC PANEL WITH GFR  2. Loss of weight - TSH - HgB A1c - CBC with Differential - HIV antibody (with reflex) - Hepatitis panel, acute - COMPLETE METABOLIC PANEL WITH GFR  3. Need for immunization against influenza - Flu Vaccine QUAD 36+ mos IM (Fluarix)  4. Immunization due - Pneumococcal  polysaccharide vaccine 23-valent greater than or equal to 2yo subcutaneous/IM  5. Tobacco dependence Smoking cessation instruction/counseling given:  counseled patient on the dangers of tobacco use, advised patient to stop smoking, and reviewed strategies to maximize success    RTC: 3 months for neuropathy Dorena Dew, FNP

## 2016-09-04 NOTE — Patient Instructions (Addendum)
Neuropathic Pain Introduction Neuropathic pain is pain caused by damage to the nerves that are responsible for certain sensations in your body (sensory nerves). The pain can be caused by damage to:  The sensory nerves that send signals to your spinal cord and brain (peripheral nervous system).  The sensory nerves in your brain or spinal cord (central nervous system). Neuropathic pain can make you more sensitive to pain. What would be a minor sensation for most people may feel very painful if you have neuropathic pain. This is usually a long-term condition that can be difficult to treat. The type of pain can differ from person to person. It may start suddenly (acute), or it may develop slowly and last for a long time (chronic). Neuropathic pain may come and go as damaged nerves heal or may stay at the same level for years. It often causes emotional distress, loss of sleep, and a lower quality of life. What are the causes? The most common cause of damage to a sensory nerve is diabetes. Many other diseases and conditions can also cause neuropathic pain. Causes of neuropathic pain can be classified as:  Toxic. Many drugs and chemicals can cause toxic damage. The most common cause of toxic neuropathic pain is damage from drug treatment for cancer (chemotherapy).  Metabolic. This type of pain can happen when a disease causes imbalances that damage nerves. Diabetes is the most common of these diseases. Vitamin B deficiency caused by long-term alcohol abuse is another common cause.  Traumatic. Any injury that cuts, crushes, or stretches a nerve can cause damage and pain. A common example is feeling pain after losing an arm or leg (phantom limb pain).  Compression-related. If a sensory nerve gets trapped or compressed for a long period of time, the blood supply to the nerve can be cut off.  Vascular. Many blood vessel diseases can cause neuropathic pain by decreasing blood supply and oxygen to  nerves.  Autoimmune. This type of pain results from diseases in which the body's defense system mistakenly attacks sensory nerves. Examples of autoimmune diseases that can cause neuropathic pain include lupus and multiple sclerosis.  Infectious. Many types of viral infections can damage sensory nerves and cause pain. Shingles infection is a common cause of this type of pain.  Inherited. Neuropathic pain can be a symptom of many diseases that are passed down through families (genetic). What are the signs or symptoms? The main symptom is pain. Neuropathic pain is often described as:  Burning.  Shock-like.  Stinging.  Hot or cold.  Itching. How is this diagnosed? No single test can diagnose neuropathic pain. Your health care provider will do a physical exam and ask you about your pain. You may use a pain scale to describe how bad your pain is. You may also have tests to see if you have a high sensitivity to pain and to help find the cause and location of any sensory nerve damage. These tests may include:  Imaging studies, such as:  X-rays.  CT scan.  MRI.  Nerve conduction studies to test how well nerve signals travel through your sensory nerves (electrodiagnostic testing).  Stimulating your sensory nerves through electrodes on your skin and measuring the response in your spinal cord and brain (somatosensory evoked potentials). How is this treated? Treatment for neuropathic pain may change over time. You may need to try different treatment options or a combination of treatments. Some options include:  Over-the-counter pain relievers.  Prescription medicines. Some medicines used to treat   other conditions may also help neuropathic pain. These include medicines to:  Control seizures (anticonvulsants).  Relieve depression (antidepressants).  Prescription-strength pain relievers (narcotics). These are usually used when other pain relievers do not help.  Transcutaneous nerve  stimulation (TENS). This uses electrical currents to block painful nerve signals. The treatment is painless.  Topical and local anesthetics. These are medicines that numb the nerves. They can be injected as a nerve block or applied to the skin.  Alternative treatments, such as:  Acupuncture.  Meditation.  Massage.  Physical therapy.  Pain management programs.  Counseling. Follow these instructions at home:  Learn as much as you can about your condition.  Take medicines only as directed by your health care provider.  Work closely with all your health care providers to find what works best for you.  Have a good support system at home.  Consider joining a chronic pain support group. Contact a health care provider if:  Your pain treatments are not helping.  You are having side effects from your medicines.  You are struggling with fatigue, mood changes, depression, or anxiety. This information is not intended to replace advice given to you by your health care provider. Make sure you discuss any questions you have with your health care provider. Document Released: 04/30/2004 Document Revised: 02/21/2016 Document Reviewed: 01/11/2014  2017 Elsevier  Peripheral Neuropathy Peripheral neuropathy is a type of nerve damage. It affects nerves that carry signals between the spinal cord and other parts of the body. These are called peripheral nerves. With peripheral neuropathy, one nerve or a group of nerves may be damaged. What are the causes? Many things can damage peripheral nerves. For some people with peripheral neuropathy, the cause is unknown. Some causes include:  Diabetes. This is the most common cause of peripheral neuropathy.  Injury to a nerve.  Pressure or stress on a nerve that lasts a long time.  Too little vitamin B. Alcoholism can lead to this.  Infections.  Autoimmune diseases, such as multiple sclerosis and systemic lupus erythematosus.  Inherited nerve  diseases.  Some medicines, such as cancer drugs.  Toxic substances, such as lead and mercury.  Too little blood flowing to the legs.  Kidney disease.  Thyroid disease. What are the signs or symptoms? Different people have different symptoms. The symptoms you have will depend on which of your nerves is damaged. Common symptoms include:  Loss of feeling (numbness) in the feet and hands.  Tingling in the feet and hands.  Pain that burns.  Very sensitive skin.  Weakness.  Not being able to move a part of the body (paralysis).  Muscle twitching.  Clumsiness or poor coordination.  Loss of balance.  Not being able to control your bladder.  Feeling dizzy.  Sexual problems. How is this diagnosed? Peripheral neuropathy is a symptom, not a disease. Finding the cause of peripheral neuropathy can be hard. To figure that out, your health care provider will take a medical history and do a physical exam. A neurological exam will also be done. This involves checking things affected by your brain, spinal cord, and nerves (nervous system). For example, your health care provider will check your reflexes, how you move, and what you can feel. Other types of tests may also be ordered, such as:  Blood tests.  A test of the fluid in your spinal cord.  Imaging tests, such as CT scans or an MRI.  Electromyography (EMG). This test checks the nerves that control muscles.  Nerve  conduction velocity tests. These tests check how fast messages pass through your nerves.  Nerve biopsy. A small piece of nerve is removed. It is then checked under a microscope. How is this treated?  Medicine is often used to treat peripheral neuropathy. Medicines may include:  Pain-relieving medicines. Prescription or over-the-counter medicine may be suggested.  Antiseizure medicine. This may be used for pain.  Antidepressants. These also may help ease pain from neuropathy.  Lidocaine. This is a numbing  medicine. You might wear a patch or be given a shot.  Mexiletine. This medicine is typically used to help control irregular heart rhythms.  Surgery. Surgery may be needed to relieve pressure on a nerve or to destroy a nerve that is causing pain.  Physical therapy to help movement.  Assistive devices to help movement. Follow these instructions at home:  Only take over-the-counter or prescription medicines as directed by your health care provider. Follow the instructions carefully for any given medicines. Do not take any other medicines without first getting approval from your health care provider.  If you have diabetes, work closely with your health care provider to keep your blood sugar under control.  If you have numbness in your feet:  Check every day for signs of injury or infection. Watch for redness, warmth, and swelling.  Wear padded socks and comfortable shoes. These help protect your feet.  Do not do things that put pressure on your damaged nerve.  Do not smoke. Smoking keeps blood from getting to damaged nerves.  Avoid or limit alcohol. Too much alcohol can cause a lack of B vitamins. These vitamins are needed for healthy nerves.  Develop a good support system. Coping with peripheral neuropathy can be stressful. Talk to a mental health specialist or join a support group if you are struggling.  Follow up with your health care provider as directed. Contact a health care provider if:  You have new signs or symptoms of peripheral neuropathy.  You are struggling emotionally from dealing with peripheral neuropathy.  You have a fever. Get help right away if:  You have an injury or infection that is not healing.  You feel very dizzy or begin vomiting.  You have chest pain.  You have trouble breathing. This information is not intended to replace advice given to you by your health care provider. Make sure you discuss any questions you have with your health care  provider. Document Released: 07/24/2002 Document Revised: 01/09/2016 Document Reviewed: 04/10/2013 Elsevier Interactive Patient Education  2017 [employer].

## 2016-09-05 LAB — HEPATITIS PANEL, ACUTE
HCV Ab: NEGATIVE
Hep A IgM: NONREACTIVE
Hep B C IgM: NONREACTIVE
Hepatitis B Surface Ag: NEGATIVE

## 2016-09-05 LAB — HIV ANTIBODY (ROUTINE TESTING W REFLEX): HIV 1&2 Ab, 4th Generation: NONREACTIVE

## 2016-09-09 ENCOUNTER — Telehealth: Payer: Self-pay

## 2016-09-09 NOTE — Telephone Encounter (Signed)
Called, no answer. Left message for patient to call back. Thanks!  

## 2016-10-13 ENCOUNTER — Ambulatory Visit (INDEPENDENT_AMBULATORY_CARE_PROVIDER_SITE_OTHER): Payer: Self-pay | Admitting: Family Medicine

## 2016-10-13 ENCOUNTER — Encounter: Payer: Self-pay | Admitting: Family Medicine

## 2016-10-13 VITALS — BP 121/82 | HR 99 | Temp 97.9°F | Resp 16 | Ht 62.0 in | Wt 129.0 lb

## 2016-10-13 DIAGNOSIS — G629 Polyneuropathy, unspecified: Secondary | ICD-10-CM

## 2016-10-13 DIAGNOSIS — F418 Other specified anxiety disorders: Secondary | ICD-10-CM

## 2016-10-13 DIAGNOSIS — F419 Anxiety disorder, unspecified: Secondary | ICD-10-CM | POA: Insufficient documentation

## 2016-10-13 DIAGNOSIS — F32A Depression, unspecified: Secondary | ICD-10-CM | POA: Insufficient documentation

## 2016-10-13 DIAGNOSIS — F329 Major depressive disorder, single episode, unspecified: Secondary | ICD-10-CM | POA: Insufficient documentation

## 2016-10-13 DIAGNOSIS — F17200 Nicotine dependence, unspecified, uncomplicated: Secondary | ICD-10-CM

## 2016-10-13 DIAGNOSIS — Z23 Encounter for immunization: Secondary | ICD-10-CM

## 2016-10-13 MED ORDER — GABAPENTIN 300 MG PO CAPS: 300.0000 mg | ORAL_CAPSULE | Freq: Four times a day (QID) | ORAL | 3 refills | Status: DC

## 2016-10-13 MED ORDER — DULOXETINE HCL 20 MG PO CPEP: 20.0000 mg | ORAL_CAPSULE | Freq: Two times a day (BID) | ORAL | 2 refills | Status: DC

## 2016-10-13 MED FILL — DULoxetine HCL 20 MG CPEP: 20 | 30 days supply | Qty: 60 | Fill #0

## 2016-10-13 MED FILL — GABAPENTIN 300 MG CAPSULE: 300 | 30 days supply | Qty: 120 | Fill #0

## 2016-10-13 NOTE — Progress Notes (Signed)
Subjective:    Patient ID: Jill Mcfarland, female    DOB: Jan 11, 1971, 46 y.o.   MRN: [id number]  HPI  [name], a 46 year old female with a history of neuropathy presents to for a follow up. She says that symptoms of neuropathy have not improved on gabapentin. Onset of symptoms was gradual, starting about 6 months ago. Symptoms are currently of moderate severity. Symptoms occur intermittently and last hours. The patient denies weakness, recent falls, headaches, memory loss.  Symptoms are primarily to left lower extremity. Patient also complains of depression. She complains of anhedonia, depressed mood, difficulty concentrating, fatigue, hopelessness and insomnia. Onset was approximately several months ago.  She denies current suicidal and homicidal plan or intent. She says that she has taken Paxil, Xanax, and Zoloft in the past without sustained relief.   Past Medical History:  Diagnosis Date  . Abscess   . Anxiety    History - no current prob per pt.  . Bartholin cyst   . Bronchitis   . Depression    History - no current prob per pt.   Social History   Social History  . Marital status: Single    Spouse name: N/A  . Number of children: N/A  . Years of education: N/A   Occupational History  . Not on file.   Social History Main Topics  . Smoking status: Current Every Day Smoker    Packs/day: 0.50    Years: 24.00    Types: Cigarettes  . Smokeless tobacco: Never Used  . Alcohol use No  . Drug use: No  . Sexual activity: Not Currently   Other Topics Concern  . Not on file   Social History Narrative  . No narrative on file   Immunization History  Administered Date(s) Administered  . Influenza,inj,Quad PF,36+ Mos 09/04/2016  . Pneumococcal Polysaccharide-23 09/04/2016   Review of Systems  Constitutional: Negative.  Negative for fatigue, fever and unexpected weight change.  HENT: Negative.   Eyes: Negative.   Respiratory: Negative.   Cardiovascular: Negative.   Negative for chest pain, palpitations and leg swelling.  Gastrointestinal: Negative.   Endocrine: Negative.  Negative for polydipsia, polyphagia and polyuria.  Genitourinary: Negative.   Musculoskeletal: Negative.   Skin: Negative.   Allergic/Immunologic: Negative.  Negative for immunocompromised state.  Neurological: Positive for numbness. Negative for weakness.  Hematological: Negative.   Psychiatric/Behavioral: The patient has insomnia.        Objective:   Physical Exam  Constitutional: She is oriented to person, place, and time. She appears well-developed and well-nourished.  HENT:  Head: Normocephalic and atraumatic.  Right Ear: External ear normal.  Left Ear: External ear normal.  Nose: Nose normal.  Mouth/Throat: Oropharynx is clear and moist.  Eyes: Conjunctivae and EOM are normal. Pupils are equal, round, and reactive to light.  Neck: Normal range of motion. Neck supple.  Cardiovascular: Normal rate, regular rhythm, normal heart sounds and intact distal pulses.   Pulmonary/Chest: Effort normal and breath sounds normal.  Abdominal: Soft. Bowel sounds are normal.  Musculoskeletal:       Right knee: She exhibits decreased range of motion.       Right ankle: She exhibits decreased range of motion and abnormal pulse. Achilles tendon exhibits no pain.  Neurological: She is alert and oriented to person, place, and time. She has normal reflexes.  Skin: Skin is warm and dry.  Psychiatric: She has a normal mood and affect. Her behavior is normal. Judgment and  thought content normal.      BP 121/82 (BP Location: Right Arm, Patient Position: Sitting, Cuff Size: Normal)   Pulse 99   Temp 97.9 F (36.6 C) (Oral)   Resp 16   Ht 5\' 2"  (1.575 m)   Wt 129 lb (58.5 kg)   LMP 10/06/2016   SpO2 99%   BMI 23.59 kg/m  Assessment & Plan:  1. Neuropathy (HCC) - gabapentin (NEURONTIN) 300 MG capsule; Take 1 capsule (300 mg total) by mouth 4 (four) times daily.  Dispense: 120 capsule;  Refill: 3  2. Anxiety and depression Depression screen [facility] 2/9 10/13/2016 10/13/2016  Decreased Interest 2 2  Down, Depressed, Hopeless 2 0  PHQ - 2 Score 4 2  Altered sleeping 1 -  Tired, decreased energy 1 -  Change in appetite 1 -  Feeling bad or failure about yourself  1 -  Trouble concentrating 1 -  Moving slowly or fidgety/restless 1 -  Suicidal thoughts 0 -  PHQ-9 Score 10 -   - DULoxetine (CYMBALTA) 20 MG capsule; Take 1 capsule (20 mg total) by mouth 2 (two) times daily.  Dispense: 60 capsule; Refill: 2  3. Tobacco dependence Smoking cessation instruction/counseling given:  counseled patient on the dangers of tobacco use, advised patient to stop smoking, and reviewed strategies to maximize success  4. Need for Tdap vaccination - Tdap vaccine greater than or equal to 7yo IM  RTC: 1 month for pap smear         Dorena Dew, FNP

## 2016-11-18 MED FILL — GABAPENTIN 300 MG CAPSULE: 300 | 30 days supply | Qty: 120 | Fill #1

## 2016-11-18 MED FILL — DULoxetine HCL 20 MG CPEP: 20 | 30 days supply | Qty: 60 | Fill #1

## 2016-12-03 ENCOUNTER — Ambulatory Visit: Payer: Self-pay | Admitting: Family Medicine

## 2016-12-16 MED FILL — DULoxetine HCL 20 MG CPEP: 20 | 30 days supply | Qty: 60 | Fill #2

## 2016-12-17 MED FILL — GABAPENTIN 300 MG CAPSULE: 300 | 30 days supply | Qty: 120 | Fill #2

## 2016-12-28 ENCOUNTER — Ambulatory Visit: Payer: Self-pay

## 2017-01-18 ENCOUNTER — Other Ambulatory Visit: Payer: Self-pay | Admitting: Family Medicine

## 2017-01-18 DIAGNOSIS — G629 Polyneuropathy, unspecified: Secondary | ICD-10-CM

## 2018-10-14 ENCOUNTER — Other Ambulatory Visit: Payer: Self-pay

## 2018-10-14 ENCOUNTER — Encounter (HOSPITAL_COMMUNITY): Payer: Self-pay | Admitting: Emergency Medicine

## 2018-10-14 ENCOUNTER — Emergency Department (HOSPITAL_COMMUNITY)
Admission: EM | Admit: 2018-10-14 | Discharge: 2018-10-14 | Disposition: A | Discharge status: Home / Self Care | Payer: Self-pay | Attending: Emergency Medicine | Admitting: Emergency Medicine

## 2018-10-14 ENCOUNTER — Emergency Department (HOSPITAL_COMMUNITY): Payer: Self-pay

## 2018-10-14 DIAGNOSIS — F17210 Nicotine dependence, cigarettes, uncomplicated: Secondary | ICD-10-CM | POA: Insufficient documentation

## 2018-10-14 DIAGNOSIS — L0291 Cutaneous abscess, unspecified: Secondary | ICD-10-CM

## 2018-10-14 DIAGNOSIS — M79641 Pain in right hand: Secondary | ICD-10-CM | POA: Insufficient documentation

## 2018-10-14 DIAGNOSIS — M25551 Pain in right hip: Secondary | ICD-10-CM | POA: Insufficient documentation

## 2018-10-14 DIAGNOSIS — N611 Abscess of the breast and nipple: Secondary | ICD-10-CM | POA: Insufficient documentation

## 2018-10-14 DIAGNOSIS — R52 Pain, unspecified: Secondary | ICD-10-CM

## 2018-10-14 DIAGNOSIS — Z79899 Other long term (current) drug therapy: Secondary | ICD-10-CM | POA: Insufficient documentation

## 2018-10-14 MED ORDER — CEPHALEXIN 500 MG PO CAPS: 500.0000 mg | ORAL_CAPSULE | Freq: Two times a day (BID) | ORAL | 0 refills | Status: AC

## 2018-10-14 MED ORDER — OXYCODONE HCL 5 MG PO TABS
5.0000 mg | ORAL_TABLET | Freq: Once | ORAL | Status: AC
  Administered 2018-10-14: 5 mg via ORAL
  Filled 2018-10-14: qty 1

## 2018-10-14 NOTE — ED Triage Notes (Signed)
Pt from home complaining of right hand pain x 3 months and right hip pain x 1 month with no trauma.  Pt comes in because neither have gotten better.  Pt Aox4 NAD noted at this time.

## 2018-10-14 NOTE — ED Notes (Signed)
Patient transported to X-ray 

## 2018-10-14 NOTE — ED Notes (Signed)
Pt verbalized understanding of discharge instructions and denies any further questions at this time.   

## 2018-10-14 NOTE — ED Provider Notes (Signed)
[facility] EMERGENCY DEPARTMENT Provider Note   CSN: [id number] Arrival date & time: 10/14/18  0848    History   Chief Complaint Chief Complaint  Patient presents with  . Hand Pain  . Hip Pain    HPI Jill Mcfarland is a 48 y.o. female.     48 y/o female with a PMH Anxiety, chronic back pain presents to the ED with a chief complaint of right hip pain, right hand pain, breast abscess x few months.  Patient reports she had carpal tunnel surgery performed on her right hand several years ago, reports having pain with hand extension and hand flexion.  Patient also reports she is had a chronic right hip pain for the past few years, had MRI done in 2017 which showed no acute process.  Reports she was seen at [facility] and told she needed to have a physician evaluate her prior to having gabapentin refilled for her.  She reports is currently not taking any medication for her symptoms.  She also endorses a right breast abscess which she drained by applying warm compresses, this abscess is currently scabbing but there is erythema around the area.  Denies any fevers, limited range of motion, trauma or other complaints.     Past Medical History:  Diagnosis Date  . Abscess   . Anxiety    History - no current prob per pt.  . Bartholin cyst   . Bronchitis   . Depression    History - no current prob per pt.    Patient Active Problem List   Diagnosis Date Noted  . Anxiety and depression 10/13/2016  . Neuropathy 09/04/2016  . Tobacco dependence 09/04/2016  . Loss of weight 09/04/2016  . Abscess of skin of abdomen 07/27/2016  . Bartholin cyst 02/14/2011  . Furuncle 02/14/2011  . Trichomonas 02/14/2011    Past Surgical History:  Procedure Laterality Date  . BARTHOLIN CYST MARSUPIALIZATION  12/03/2011   Procedure: BARTHOLIN CYST MARSUPIALIZATION;  Surgeon: Mora Bellman, MD;  Location: [facility];  Service: Gynecology;  Laterality: Left;  Bartholin  Abcess  . CARPAL TUNNEL RELEASE    . Incison and drainage     Bartholin cyst  . LEEP    . SVD     x 3  . TUBAL LIGATION    . WISDOM TOOTH EXTRACTION       OB History    Gravida  4   Para  3   Term  3   Preterm      AB  1   Living  2     SAB      TAB  1   Ectopic      Multiple      Live Births               Home Medications    Prior to Admission medications   Medication Sig Start Date End Date Taking? Authorizing Provider  acetaminophen (TYLENOL) 500 MG tablet Take 1 tablet (500 mg total) by mouth every 6 (six) hours as needed. 09/04/16   Dorena Dew, FNP  cephALEXin (KEFLEX) 500 MG capsule Take 1 capsule (500 mg total) by mouth 2 (two) times daily for 7 days. 10/14/18 10/21/18  Janeece Fitting, PA-C  DULoxetine (CYMBALTA) 20 MG capsule Take 1 capsule (20 mg total) by mouth 2 (two) times daily. 10/13/16   Dorena Dew, FNP  gabapentin (NEURONTIN) 300 MG capsule TAKE 1 CAPSULE BY MOUTH 4  TIMES DAILY. 01/18/17   Dorena Dew, FNP    Family History Family History  Problem Relation Age of Onset  . Cancer Mother   . Asthma Son     Social History Social History   Tobacco Use  . Smoking status: Current Every Day Smoker    Packs/day: 0.50    Years: 24.00    Pack years: 12.00    Types: Cigarettes  . Smokeless tobacco: Never Used  Substance Use Topics  . Alcohol use: No  . Drug use: No     Allergies   Tramadol and Vicodin [hydrocodone-acetaminophen]   Review of Systems Review of Systems  Constitutional: Negative for fever.  Musculoskeletal: Positive for arthralgias and myalgias.  Skin: Positive for wound.     Physical Exam Updated Vital Signs BP 105/78 (BP Location: Right Arm)   Pulse 83   Temp 97.8 F (36.6 C) (Oral)   Resp 17   Ht 5\' 2"  (1.575 m)   Wt 53.5 kg   LMP 09/13/2018 Comment: peri-menopausal  SpO2 96%   BMI 21.58 kg/m   Physical Exam Vitals signs and nursing note reviewed.  Constitutional:      General: She  is not in acute distress.    Appearance: She is well-developed.  HENT:     Head: Normocephalic and atraumatic.     Mouth/Throat:     Pharynx: No oropharyngeal exudate.  Eyes:     Pupils: Pupils are equal, round, and reactive to light.  Neck:     Musculoskeletal: Normal range of motion.  Cardiovascular:     Rate and Rhythm: Regular rhythm.     Heart sounds: Normal heart sounds.  Pulmonary:     Effort: Pulmonary effort is normal. No respiratory distress.     Breath sounds: Normal breath sounds.  Abdominal:     General: Bowel sounds are normal. There is no distension.     Palpations: Abdomen is soft.     Tenderness: There is no abdominal tenderness.  Musculoskeletal:        General: No deformity.     Right wrist: She exhibits no tenderness.     Right hip: She exhibits tenderness. She exhibits normal range of motion and normal strength.     Right hand: She exhibits tenderness. She exhibits normal range of motion, normal capillary refill, no deformity, no laceration and no swelling. Normal sensation noted. Decreased sensation is not present in the ulnar distribution, is not present in the medial distribution and is not present in the radial distribution. Normal strength noted. She exhibits no finger abduction, no thumb/finger opposition and no wrist extension trouble.       Hands:     Right lower leg: No edema.     Left lower leg: No edema.  Skin:    General: Skin is warm and dry.     Findings: Abscess and erythema present.       Neurological:     Mental Status: She is alert and oriented to person, place, and time.      ED Treatments / Results  Labs (all labs ordered are listed, but only abnormal results are displayed) Labs Reviewed - No data to display  EKG None  Radiology Dg Hand 2 View Right  Result Date: 10/14/2018 CLINICAL DATA:  Chronic pain of the thumb and wrists. History of carpal tunnel release. EXAM: RIGHT HAND - 2 VIEW COMPARISON:  02/20/2012 FINDINGS: No  evidence of arthropathy or focal bone lesion. Specific attention to the thumb  and first carpometacarpal joint does not show any abnormality. IMPRESSION: Negative. Electronically Signed   By: Nelson Chimes M.D.   On: 10/14/2018 10:07    Procedures Procedures (including critical care time)  Medications Ordered in ED Medications  oxyCODONE (Oxy IR/ROXICODONE) immediate release tablet 5 mg (5 mg Oral Given 10/14/18 0932)     Initial Impression / Assessment and Plan / ED Course  I have reviewed the triage vital signs and the nursing notes.  Pertinent labs & imaging results that were available during my care of the patient were reviewed by me and considered in my medical decision making (see chart for details).      Patient with a previous history of chronic back pain presents to the ED with a chief complaint of right hand pain, right hip pain and breast abscess. She reports being followed by [facility] and wellness for her chronic back pain along with hip pain. Patient states she has not seen them in over two years due to waiting for 'retro medicaid to kick in". She reports no trauma or fever associated with the pains.   An xray of her right hand was ordered which showed no acute fracture or dislocation. She has full ROM, no obvious deformity. She reports having a previous carpal tunnel surgery and has not followed up with her orthopedist. According to her records which I have personally reviewed patient was managed at [facility] and wellness by [place], she was given a prescription for gabapentin 2 years ago which had 3 refills, she reports she has not been back to see them as she read online about the side effects of gabapentin which she did not want to take.  I have advised patient that I cannot prescribe narcotic medication at this time because this is a chronic complaint and she needs further follow-up.  Patient understands.  Stable vital signs, ambulatory with a steady gait.  Final Clinical  Impressions(s) / ED Diagnoses   Final diagnoses:  Right hip pain  Right hand pain  Abscess    ED Discharge Orders         Ordered    cephALEXin (KEFLEX) 500 MG capsule  2 times daily     10/14/18 1047           Janeece Fitting, PA-C 10/14/18 1053    Quintella Reichert, MD 10/15/18 [phone]

## 2018-10-14 NOTE — Discharge Instructions (Addendum)
Your x-ray today was normal, please schedule an appointment with the orthopedist who did your carpal tunnel surgery for further follow-up on your hand pain.  You are also encouraged to seek medical treatment at [facility] health and wellness for management of your chronic hip pain. I have prescribed antibiotics to help with your right breast abscess which is currently draining.  Please take 1 tablet twice a day for the next 7 days.

## 2020-01-10 ENCOUNTER — Other Ambulatory Visit: Payer: Self-pay

## 2020-01-10 ENCOUNTER — Emergency Department
Admission: EM | Admit: 2020-01-10 | Discharge: 2020-01-10 | Disposition: A | Discharge status: Home / Self Care | Payer: No Typology Code available for payment source | Attending: Emergency Medicine | Admitting: Emergency Medicine

## 2020-01-10 ENCOUNTER — Emergency Department: Payer: No Typology Code available for payment source

## 2020-01-10 ENCOUNTER — Encounter: Payer: Self-pay | Admitting: Emergency Medicine

## 2020-01-10 DIAGNOSIS — Y9389 Activity, other specified: Secondary | ICD-10-CM | POA: Diagnosis not present

## 2020-01-10 DIAGNOSIS — R519 Headache, unspecified: Secondary | ICD-10-CM | POA: Diagnosis not present

## 2020-01-10 DIAGNOSIS — Y999 Unspecified external cause status: Secondary | ICD-10-CM | POA: Insufficient documentation

## 2020-01-10 DIAGNOSIS — Y92410 Unspecified street and highway as the place of occurrence of the external cause: Secondary | ICD-10-CM | POA: Insufficient documentation

## 2020-01-10 DIAGNOSIS — S161XXA Strain of muscle, fascia and tendon at neck level, initial encounter: Secondary | ICD-10-CM | POA: Insufficient documentation

## 2020-01-10 DIAGNOSIS — Z79899 Other long term (current) drug therapy: Secondary | ICD-10-CM | POA: Diagnosis not present

## 2020-01-10 DIAGNOSIS — F17210 Nicotine dependence, cigarettes, uncomplicated: Secondary | ICD-10-CM | POA: Insufficient documentation

## 2020-01-10 DIAGNOSIS — S199XXA Unspecified injury of neck, initial encounter: Secondary | ICD-10-CM | POA: Diagnosis present

## 2020-01-10 MED ORDER — MELOXICAM 15 MG PO TABS: 15.0000 mg | ORAL_TABLET | Freq: Every day | ORAL | 0 refills | Status: DC

## 2020-01-10 MED ORDER — METHOCARBAMOL 500 MG PO TABS
1000.0000 mg | ORAL_TABLET | Freq: Once | ORAL | Status: AC
  Administered 2020-01-10: 1000 mg via ORAL
  Filled 2020-01-10: qty 2

## 2020-01-10 MED ORDER — METHOCARBAMOL 500 MG PO TABS: 500.0000 mg | ORAL_TABLET | Freq: Four times a day (QID) | ORAL | 0 refills | Status: DC

## 2020-01-10 MED ORDER — MELOXICAM 7.5 MG PO TABS
15.0000 mg | ORAL_TABLET | Freq: Once | ORAL | Status: AC
  Administered 2020-01-10: 15 mg via ORAL
  Filled 2020-01-10: qty 2
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
## 2020-01-29 ENCOUNTER — Other Ambulatory Visit: Payer: Self-pay

## 2020-01-29 ENCOUNTER — Encounter (HOSPITAL_COMMUNITY): Payer: Self-pay

## 2020-01-29 ENCOUNTER — Emergency Department (HOSPITAL_COMMUNITY)
Admission: EM | Admit: 2020-01-29 | Discharge: 2020-01-29 | Disposition: A | Discharge status: Home / Self Care | Payer: Self-pay | Attending: Emergency Medicine | Admitting: Emergency Medicine

## 2020-01-29 DIAGNOSIS — Z5321 Procedure and treatment not carried out due to patient leaving prior to being seen by health care provider: Secondary | ICD-10-CM | POA: Insufficient documentation

## 2020-01-29 DIAGNOSIS — R443 Hallucinations, unspecified: Secondary | ICD-10-CM | POA: Insufficient documentation

## 2020-01-29 NOTE — ED Triage Notes (Signed)
Patient arrived via [facility] after stating there are bugs crawling from under her mattress. Patient reporting hallucinations started last week when she ran out of xanax. Per EMS patient missing roughly 20 Xanax, she is supposed to take 4 times a day. 500cc NS given in route.

## 2020-01-29 NOTE — ED Notes (Signed)
Per EDP-if patient is not SI/HI and wanting to leave, it is her decision

## 2020-11-13 ENCOUNTER — Ambulatory Visit
Admission: EM | Admit: 2020-11-13 | Discharge: 2020-11-13 | Disposition: A | Discharge status: Home / Self Care | Payer: 59 | Attending: Emergency Medicine | Admitting: Emergency Medicine

## 2020-11-13 ENCOUNTER — Encounter: Payer: Self-pay | Admitting: Emergency Medicine

## 2020-11-13 ENCOUNTER — Other Ambulatory Visit: Payer: Self-pay

## 2020-11-13 DIAGNOSIS — N898 Other specified noninflammatory disorders of vagina: Secondary | ICD-10-CM | POA: Diagnosis not present

## 2020-11-13 DIAGNOSIS — N907 Vulvar cyst: Secondary | ICD-10-CM | POA: Insufficient documentation

## 2020-11-13 MED ORDER — IBUPROFEN 800 MG PO TABS: 800.0000 mg | ORAL_TABLET | Freq: Once | ORAL | Status: DC

## 2020-11-13 MED ORDER — NAPROXEN 500 MG PO TABS: 500.0000 mg | ORAL_TABLET | Freq: Two times a day (BID) | ORAL | 0 refills | Status: DC

## 2020-11-13 MED ORDER — DOXYCYCLINE HYCLATE 100 MG PO CAPS: 100.0000 mg | ORAL_CAPSULE | Freq: Two times a day (BID) | ORAL | 0 refills | Status: DC

## 2020-11-13 MED ORDER — FLUCONAZOLE 200 MG PO TABS: ORAL_TABLET | ORAL | 0 refills | Status: DC

## 2020-11-13 NOTE — ED Triage Notes (Signed)
Pt is present today with vaginal discomfort/ Pt states that she has had a recurrent vaginal cyst that is now causing vaginal itching and discharge. Pt states that she noticed her discomfort Saturday

## 2020-11-13 NOTE — Discharge Instructions (Addendum)
Soak in warm water to promote additional drainage from this.  Complete course of antibiotics.   Naproxen twice a day as needed for pain. Take with food.  Diflucan today and repeat at end of antibiotics, for yeast infection.  Follow up with gynecology for repeat evaluation and long term treatment.  We will notify of you any positive findings from your vaginal testing or if any changes to treatment are needed. If normal or otherwise without concern to your results, we will not call you. Please log on to your MyChart to review your results if interested in so.

## 2020-11-13 NOTE — ED Provider Notes (Signed)
[facility]    CSN: [id number] Arrival date & time: 11/13/20  5009      History   Chief Complaint Chief Complaint  Patient presents with  . Cyst  . Vaginal Itching  . Vaginal Discharge    HPI Jill Mcfarland is a 50 y.o. female.   [name] presents with complaints of painful vulvar cyst which has been present for months. No known drainage. Has increased in size. Has had these frequently, including surgery to bartholin cyst in the past. Over the past week or so noted vaginal itching and discharge. No recent antibiotics- states feels similar to yeast infections she has had in the past related to antibiotics. She is sexually active with 1 partner, denies concerns for stds.     ROS per HPI, negative if not otherwise mentioned.      Past Medical History:  Diagnosis Date  . Abscess   . Anxiety    History - no current prob per pt.  . Bartholin cyst   . Bronchitis   . Depression    History - no current prob per pt.    Patient Active Problem List   Diagnosis Date Noted  . Anxiety and depression 10/13/2016  . Neuropathy 09/04/2016  . Tobacco dependence 09/04/2016  . Loss of weight 09/04/2016  . Abscess of skin of abdomen 07/27/2016  . Bartholin cyst 02/14/2011  . Furuncle 02/14/2011  . Trichomonas 02/14/2011    Past Surgical History:  Procedure Laterality Date  . BARTHOLIN CYST MARSUPIALIZATION  12/03/2011   Procedure: BARTHOLIN CYST MARSUPIALIZATION;  Surgeon: Mora Bellman, MD;  Location: [facility];  Service: Gynecology;  Laterality: Left;  Bartholin Abcess  . CARPAL TUNNEL RELEASE    . Incison and drainage     Bartholin cyst  . LEEP    . SVD     x 3  . TUBAL LIGATION    . WISDOM TOOTH EXTRACTION      OB History    Gravida  4   Para  3   Term  3   Preterm      AB  1   Living  2     SAB      IAB  1   Ectopic      Multiple      Live Births               Home Medications    Prior to Admission medications    Medication Sig Start Date End Date Taking? Authorizing Provider  doxycycline (VIBRAMYCIN) 100 MG capsule Take 1 capsule (100 mg total) by mouth 2 (two) times daily. 11/13/20  Yes Gladys Deckard, Lanelle Bal B, NP  fluconazole (DIFLUCAN) 200 MG tablet Take once today. Take second pill at completion of antibiotics. 11/13/20  Yes Gizelle Whetsel, Lanelle Bal B, NP  naproxen (NAPROSYN) 500 MG tablet Take 1 tablet (500 mg total) by mouth 2 (two) times daily. 11/13/20  Yes Augusto Gamble B, NP  acetaminophen (TYLENOL) 500 MG tablet Take 1 tablet (500 mg total) by mouth every 6 (six) hours as needed. 09/04/16   Dorena Dew, FNP  ALPRAZolam Duanne Moron) 1 MG tablet Take 1 mg by mouth 4 (four) times daily as needed. 10/30/20   [provider]  amphetamine-dextroamphetamine (ADDERALL) 20 MG tablet Take 20 mg by mouth 3 (three) times daily. 11/08/20   [provider]  DULoxetine (CYMBALTA) 20 MG capsule Take 1 capsule (20 mg total) by mouth 2 (two) times daily. 10/13/16   Smith Robert,  Asencion Partridge, FNP  gabapentin (NEURONTIN) 300 MG capsule TAKE 1 CAPSULE BY MOUTH 4 TIMES DAILY. 01/18/17   Dorena Dew, FNP  gabapentin (NEURONTIN) 600 MG tablet Take 600 mg by mouth 4 (four) times daily. 11/01/20   [provider]  meloxicam (MOBIC) 15 MG tablet Take 1 tablet (15 mg total) by mouth daily. 01/10/20   Cuthriell, Charline Bills, PA-C  methocarbamol (ROBAXIN) 500 MG tablet Take 1 tablet (500 mg total) by mouth 4 (four) times daily. 01/10/20   Cuthriell, Charline Bills, PA-C    Family History Family History  Problem Relation Age of Onset  . Cancer Mother   . Asthma Son     Social History Social History   Tobacco Use  . Smoking status: Current Every Day Smoker    Packs/day: 0.50    Years: 24.00    Pack years: 12.00    Types: Cigarettes  . Smokeless tobacco: Never Used  Substance Use Topics  . Alcohol use: No  . Drug use: No     Allergies   Tramadol and Vicodin [hydrocodone-acetaminophen]   Review of  Systems Review of Systems   Physical Exam Triage Vital Signs ED Triage Vitals  Enc Vitals Group     BP 11/13/20 0824 109/69     Pulse Rate 11/13/20 0824 98     Resp 11/13/20 0824 17     Temp 11/13/20 0824 98 F (36.7 C)     Temp Source 11/13/20 0824 Oral     SpO2 11/13/20 0824 97 %     Weight --      Height --      Head Circumference --      Peak Flow --      Pain Score 11/13/20 0828 9     Pain Loc --      Pain Edu? --      Excl. in [place]? --    No data found.  Updated Vital Signs BP 109/69 (BP Location: Left Arm)   Pulse 98   Temp 98 F (36.7 C) (Oral)   Resp 17   LMP 02/27/2019 (Approximate)   SpO2 97%    Physical Exam Constitutional:      General: She is not in acute distress.    Appearance: She is well-developed.  Cardiovascular:     Rate and Rhythm: Normal rate.  Pulmonary:     Effort: Pulmonary effort is normal.  Genitourinary:      Comments: Large labial cyst, approximately 2 cm in diameter, soft and fluctuant without induration; superior to this is a smaller 0.5cm cyst; no active drainage from these; vaginal discharge noted externally which is creamy white appearing.  Skin:    General: Skin is warm and dry.  Neurological:     Mental Status: She is alert and oriented to person, place, and time.      UC Treatments / Results  Labs (all labs ordered are listed, but only abnormal results are displayed) Labs Reviewed  CERVICOVAGINAL ANCILLARY ONLY    EKG   Radiology No results found.  Procedures Incision and Drainage  Date/Time: 11/13/2020 9:54 PM Performed by: Zigmund Gottron, NP Authorized by: Zigmund Gottron, NP   Consent:    Consent obtained:  Verbal   Consent given by:  Patient   Risks, benefits, and alternatives were discussed: yes     Risks discussed:  Bleeding, incomplete drainage, pain and infection   Alternatives discussed:  No treatment, referral and observation Universal protocol:  Patient identity confirmed:  Verbally  with patient Location:    Type:  Cyst   Size:  2   Location:  Anogenital   Anogenital location:  Vulva Pre-procedure details:    Skin preparation:  Povidone-iodine Sedation:    Sedation type:  None Anesthesia:    Anesthesia method:  Topical application   Topical anesthesia: pain ease freeze spray. Procedure type:    Complexity:  Simple Procedure details:    Incision types:  Stab incision   Drainage:  Bloody and serous   Drainage amount:  Scant   Wound treatment:  Wound left open   Packing materials:  None Post-procedure details:    Procedure completion:  Procedure terminated at patient's request   (including critical care time)  Medications Ordered in UC Medications - No data to display  Initial Impression / Assessment and Plan / UC Course  I have reviewed the triage vital signs and the nursing notes.  Pertinent labs & imaging results that were available during my care of the patient were reviewed by me and considered in my medical decision making (see chart for details).     Vaginal discharge, cytology pending. Large painful cyst, stab incision although patient did nto tolerate well so minimal drainage today, warm compresses recommended in order to promote additional drainage. Gyne follow up recommended as patient has many small cysts present as well.  Final Clinical Impressions(s) / UC Diagnoses   Final diagnoses:  Vaginal discharge  Vulvar cyst     Discharge Instructions     Soak in warm water to promote additional drainage from this.  Complete course of antibiotics.   Naproxen twice a day as needed for pain. Take with food.  Diflucan today and repeat at end of antibiotics, for yeast infection.  Follow up with gynecology for repeat evaluation and long term treatment.  We will notify of you any positive findings from your vaginal testing or if any changes to treatment are needed. If normal or otherwise without concern to your results, we will not call you. Please  log on to your MyChart to review your results if interested in so.      ED Prescriptions    Medication Sig Dispense Auth. Provider   naproxen (NAPROSYN) 500 MG tablet Take 1 tablet (500 mg total) by mouth 2 (two) times daily. 30 tablet Augusto Gamble B, NP   doxycycline (VIBRAMYCIN) 100 MG capsule Take 1 capsule (100 mg total) by mouth 2 (two) times daily. 20 capsule Augusto Gamble B, NP   fluconazole (DIFLUCAN) 200 MG tablet Take once today. Take second pill at completion of antibiotics. 2 tablet Zigmund Gottron, NP     PDMP not reviewed this encounter.   Zigmund Gottron, NP 11/13/20 2155

## 2020-11-14 LAB — CERVICOVAGINAL ANCILLARY ONLY
Bacterial Vaginitis (gardnerella): NEGATIVE
Candida Glabrata: NEGATIVE
Candida Vaginitis: NEGATIVE
Chlamydia: NEGATIVE
Comment: NEGATIVE
Comment: NEGATIVE
Comment: NEGATIVE
Comment: NEGATIVE
Comment: NEGATIVE
Comment: NORMAL
Neisseria Gonorrhea: NEGATIVE
Trichomonas: POSITIVE — AB

## 2020-11-15 ENCOUNTER — Telehealth (HOSPITAL_COMMUNITY): Payer: Self-pay | Admitting: Emergency Medicine

## 2020-11-15 MED ORDER — METRONIDAZOLE 500 MG PO TABS: 500.0000 mg | ORAL_TABLET | Freq: Two times a day (BID) | ORAL | 0 refills | Status: DC

## 2020-11-19 NOTE — Progress Notes (Signed)
Contacted patient by phone.  Verified identity using two identifiers.  Provided positive result.  Reviewed safe sex practices, notifying partners, and refraining from sexual activities for 7 days from time of treatment.  Patient verified understanding, all questions answered.   Patient became very agitated on the phone when she states she will need a note for Sunday due to having to miss work.  This RN explained that we would not be able to provide a note for that far out and patient began to raise her voice with this RN and demanded she be contacted by the provider who saw her.  I provided her Patient Experience's number to share her concerns, patient verbalized understanding

## 2021-02-16 ENCOUNTER — Encounter: Payer: Self-pay | Admitting: Emergency Medicine

## 2021-02-16 ENCOUNTER — Other Ambulatory Visit: Payer: Self-pay

## 2021-02-16 ENCOUNTER — Ambulatory Visit
Admission: EM | Admit: 2021-02-16 | Discharge: 2021-02-16 | Disposition: A | Discharge status: Home / Self Care | Payer: 59 | Attending: Family Medicine | Admitting: Family Medicine

## 2021-02-16 DIAGNOSIS — N898 Other specified noninflammatory disorders of vagina: Secondary | ICD-10-CM | POA: Diagnosis not present

## 2021-02-16 MED ORDER — FLUCONAZOLE 150 MG PO TABS: 150.0000 mg | ORAL_TABLET | Freq: Once | ORAL | 0 refills | Status: AC

## 2021-02-16 NOTE — ED Notes (Signed)
Patient tapping on phone throughout assessment.

## 2021-02-16 NOTE — ED Triage Notes (Signed)
6/30 symptoms started.  Report having the same symptoms in march.  Patient reports vaginal itching.  Patient has vaginal discharge

## 2021-02-16 NOTE — ED Provider Notes (Signed)
[facility]    CSN: [id number] Arrival date & time: 02/16/21  0802      History   Chief Complaint Chief Complaint  Patient presents with   Vaginal Pain    HPI 50 year old female presents with the above complaint.  Patient reports that her symptoms started on 6/30.  She reports vaginal discharge and itching.  She states that she is having vaginal irritation as well.  She states that it is very uncomfortable.  No abdominal pain.  No fever.  Desires STD testing today as well.  No relieving factors.  No medications or interventions tried.  Past Medical History:  Diagnosis Date   Abscess    Anxiety    History - no current prob per pt.   Bartholin cyst    Bronchitis    Depression    History - no current prob per pt.    Patient Active Problem List   Diagnosis Date Noted   Anxiety and depression 10/13/2016   Neuropathy 09/04/2016   Tobacco dependence 09/04/2016   Loss of weight 09/04/2016   Abscess of skin of abdomen 07/27/2016   Bartholin cyst 02/14/2011   Furuncle 02/14/2011   Trichomonas 02/14/2011    Past Surgical History:  Procedure Laterality Date   BARTHOLIN CYST MARSUPIALIZATION  12/03/2011   Procedure: BARTHOLIN CYST MARSUPIALIZATION;  Surgeon: Mora Bellman, MD;  Location: [facility];  Service: Gynecology;  Laterality: Left;  Bartholin Abcess   CARPAL TUNNEL RELEASE     Incison and drainage     Bartholin cyst   LEEP     SVD     x 3   TUBAL LIGATION     WISDOM TOOTH EXTRACTION      OB History     Gravida  4   Para  3   Term  3   Preterm      AB  1   Living  2      SAB      IAB  1   Ectopic      Multiple      Live Births               Home Medications    Prior to Admission medications   Medication Sig Start Date End Date Taking? Authorizing Provider  ALPRAZolam Duanne Moron) 1 MG tablet Take 1 mg by mouth 4 (four) times daily as needed. 10/30/20  Yes [provider]  amphetamine-dextroamphetamine (ADDERALL) 20 MG  tablet Take 20 mg by mouth 3 (three) times daily. 11/08/20  Yes [provider]  fluconazole (DIFLUCAN) 150 MG tablet Take 1 tablet (150 mg total) by mouth once for 1 dose. Repeat dose in 72 hours. 02/16/21 02/16/21 Yes Saraya Tirey G, DO  acetaminophen (TYLENOL) 500 MG tablet Take 1 tablet (500 mg total) by mouth every 6 (six) hours as needed. 09/04/16   Dorena Dew, FNP  gabapentin (NEURONTIN) 300 MG capsule TAKE 1 CAPSULE BY MOUTH 4 TIMES DAILY. 01/18/17   Dorena Dew, FNP  gabapentin (NEURONTIN) 600 MG tablet Take 600 mg by mouth 4 (four) times daily. 11/01/20   [provider]    Family History Family History  Problem Relation Age of Onset   Cancer Mother    Asthma Son     Social History Social History   Tobacco Use   Smoking status: Every Day    Packs/day: 0.50    Years: 24.00    Pack years: 12.00    Types: Cigarettes  Smokeless tobacco: Never  Vaping Use   Vaping Use: Never used  Substance Use Topics   Alcohol use: No   Drug use: No     Allergies   Tramadol and Vicodin [hydrocodone-acetaminophen]   Review of Systems Review of Systems  Constitutional: Negative.   Gastrointestinal: Negative.   Genitourinary:  Positive for vaginal discharge.    Physical Exam Triage Vital Signs ED Triage Vitals  Enc Vitals Group     BP      Pulse      Resp      Temp      Temp src      SpO2      Weight      Height      Head Circumference      Peak Flow      Pain Score      Pain Loc      Pain Edu?      Excl. in [place]?    Updated Vital Signs BP 101/67 (BP Location: Left Arm)   Pulse (!) 104   Temp 98.2 F (36.8 C) (Oral)   Resp 20   LMP 02/27/2019 (Approximate)   SpO2 93%   Visual Acuity Right Eye Distance:   Left Eye Distance:   Bilateral Distance:    Right Eye Near:   Left Eye Near:    Bilateral Near:     Physical Exam Vitals and nursing note reviewed.  Constitutional:      General: She is not in acute distress.    Appearance:  Normal appearance. She is not ill-appearing.  HENT:     Head: Normocephalic and atraumatic.  Eyes:     General:        Right eye: No discharge.        Left eye: No discharge.     Conjunctiva/sclera: Conjunctivae normal.  Cardiovascular:     Rate and Rhythm: Regular rhythm. Tachycardia present.  Pulmonary:     Effort: Pulmonary effort is normal. No respiratory distress.  Abdominal:     General: There is no distension.     Palpations: Abdomen is soft.     Tenderness: There is no abdominal tenderness.  Neurological:     Mental Status: She is alert.     UC Treatments / Results  Labs (all labs ordered are listed, but only abnormal results are displayed) Labs Reviewed  CERVICOVAGINAL ANCILLARY ONLY    EKG   Radiology No results found.  Procedures Procedures (including critical care time)  Medications Ordered in UC Medications - No data to display  Initial Impression / Assessment and Plan / UC Course  I have reviewed the triage vital signs and the nursing notes.  Pertinent labs & imaging results that were available during my care of the patient were reviewed by me and considered in my medical decision making (see chart for details).    50 year old female presents with vaginal discharge.  Likely yeast vaginitis.  Treating empirically with Diflucan.  Awaiting test results.  Final Clinical Impressions(s) / UC Diagnoses   Final diagnoses:  Vaginal discharge     Discharge Instructions      Medication as prescribed.  Check mychart for results of your testing. You will receive a call with positive results.  Take care  Dr. Lacinda Axon      ED Prescriptions     Medication Sig Dispense Auth. Provider   fluconazole (DIFLUCAN) 150 MG tablet Take 1 tablet (150 mg total) by mouth once for 1 dose. Repeat  dose in 72 hours. 2 tablet Coral Spikes, DO      PDMP not reviewed this encounter.   Coral Spikes, [place] 02/16/21 [phone]

## 2021-02-16 NOTE — Discharge Instructions (Addendum)
Medication as prescribed.  Check mychart for results of your testing. You will receive a call with positive results.  Take care  Dr. Lacinda Axon

## 2021-02-18 ENCOUNTER — Telehealth (HOSPITAL_COMMUNITY): Payer: Self-pay | Admitting: Emergency Medicine

## 2021-02-18 LAB — CERVICOVAGINAL ANCILLARY ONLY
Bacterial Vaginitis (gardnerella): NEGATIVE
Candida Glabrata: NEGATIVE
Candida Vaginitis: NEGATIVE
Chlamydia: NEGATIVE
Comment: NEGATIVE
Comment: NEGATIVE
Comment: NEGATIVE
Comment: NEGATIVE
Comment: NEGATIVE
Comment: NORMAL
Neisseria Gonorrhea: NEGATIVE
Trichomonas: POSITIVE — AB

## 2021-02-18 MED ORDER — METRONIDAZOLE 500 MG PO TABS: 500.0000 mg | ORAL_TABLET | Freq: Two times a day (BID) | ORAL | 0 refills | Status: DC

## 2021-12-16 ENCOUNTER — Ambulatory Visit (HOSPITAL_COMMUNITY)
Admission: EM | Admit: 2021-12-16 | Discharge: 2021-12-16 | Disposition: A | Discharge status: Home / Self Care | Payer: 59

## 2021-12-16 ENCOUNTER — Encounter (HOSPITAL_COMMUNITY): Payer: Self-pay

## 2021-12-16 DIAGNOSIS — A084 Viral intestinal infection, unspecified: Secondary | ICD-10-CM

## 2021-12-16 MED ORDER — ONDANSETRON 4 MG PO TBDP: 4.0000 mg | ORAL_TABLET | Freq: Three times a day (TID) | ORAL | 0 refills | Status: DC | PRN

## 2021-12-16 NOTE — ED Triage Notes (Signed)
2 day h/o emesis, sweats, nausea and one day of diarrhea and HA. No meds taken. ?

## 2021-12-16 NOTE — Discharge Instructions (Addendum)
-   Your symptoms are consistent with a stomach bug; please be sure you are drinking plenty of fluids ?-If the nausea returns, you can use Zofran under your tongue every 8 hours as needed ?-Please seek care if your symptoms worsen and you develop nausea/vomiting/diarrhea and are unable to keep fluids down for more than 48 hours ?

## 2021-12-16 NOTE — ED Provider Notes (Signed)
?[facility] ? ? ? ?CSN: [id number] ?Arrival date & time: 12/16/21  1457 ? ? ?  ? ?History   ?Chief Complaint ?Chief Complaint  ?Patient presents with  ? Nausea  ? ? ?HPI ?Jill Mcfarland is a 51 y.o. female.  ? ?Patient presents with 1 day of nausea/vomiting, diarrhea.  She reports she had multiple episodes of vomiting and diarrhea today.  Did not see any blood in her vomit or stool.  She reports today, she is feeling a lot better.  Denies abdominal pain.  She is having some body aches and sweats, decreased appetite.  She has been drinking liquids today and keeping them down.  Denies any known sick contacts.  Denies any recent antibiotic use.  No changes to chronic medications. ? ? ? ? ?Past Medical History:  ?Diagnosis Date  ? Abscess   ? Anxiety   ? History - no current prob per pt.  ? Bartholin cyst   ? Bronchitis   ? Depression   ? History - no current prob per pt.  ? ? ?Patient Active Problem List  ? Diagnosis Date Noted  ? Anxiety and depression 10/13/2016  ? Neuropathy 09/04/2016  ? Tobacco dependence 09/04/2016  ? Loss of weight 09/04/2016  ? Abscess of skin of abdomen 07/27/2016  ? Bartholin cyst 02/14/2011  ? Furuncle 02/14/2011  ? Trichomonas 02/14/2011  ? ? ?Past Surgical History:  ?Procedure Laterality Date  ? BARTHOLIN CYST MARSUPIALIZATION  12/03/2011  ? Procedure: BARTHOLIN CYST MARSUPIALIZATION;  Surgeon: Mora Bellman, MD;  Location: [facility];  Service: Gynecology;  Laterality: Left;  Bartholin Abcess  ? CARPAL TUNNEL RELEASE    ? Incison and drainage    ? Bartholin cyst  ? LEEP    ? SVD    ? x 3  ? TUBAL LIGATION    ? WISDOM TOOTH EXTRACTION    ? ? ?OB History   ? ? Gravida  ?4  ? Para  ?3  ? Term  ?3  ? Preterm  ?   ? AB  ?1  ? Living  ?2  ?  ? ? SAB  ?   ? IAB  ?1  ? Ectopic  ?   ? Multiple  ?   ? Live Births  ?   ?   ?  ?  ? ? ? ?Home Medications   ? ?Prior to Admission medications   ?Medication Sig Start Date End Date Taking? Authorizing Provider  ?ondansetron (ZOFRAN-ODT) 4 MG  disintegrating tablet Take 1 tablet (4 mg total) by mouth every 8 (eight) hours as needed for nausea or vomiting. 12/16/21  Yes Eulogio Bear, NP  ?acetaminophen (TYLENOL) 500 MG tablet Take 1 tablet (500 mg total) by mouth every 6 (six) hours as needed. 09/04/16   Dorena Dew, FNP  ?ALPRAZolam (XANAX) 1 MG tablet Take 1 mg by mouth 4 (four) times daily as needed. 10/30/20   [provider]  ?amphetamine-dextroamphetamine (ADDERALL) 20 MG tablet Take 20 mg by mouth 3 (three) times daily. 11/08/20   [provider]  ?gabapentin (NEURONTIN) 300 MG capsule TAKE 1 CAPSULE BY MOUTH 4 TIMES DAILY. 01/18/17   Dorena Dew, FNP  ?gabapentin (NEURONTIN) 600 MG tablet Take 600 mg by mouth 4 (four) times daily. 11/01/20   [provider]  ?sertraline (ZOLOFT) 100 MG tablet Take 100 mg by mouth daily. 12/02/21   [provider]  ? ? ?Family History ?Family History  ?Problem Relation Age of  Onset  ? Cancer Mother   ? Asthma Son   ? ? ?Social History ?Social History  ? ?Tobacco Use  ? Smoking status: Every Day  ?  Packs/day: 0.50  ?  Years: 24.00  ?  Pack years: 12.00  ?  Types: Cigarettes  ? Smokeless tobacco: Never  ?Vaping Use  ? Vaping Use: Never used  ?Substance Use Topics  ? Alcohol use: No  ? Drug use: No  ? ? ? ?Allergies   ?Tramadol and Vicodin [hydrocodone-acetaminophen] ? ? ?Review of Systems ?Review of Systems ?Per HPI ? ?Physical Exam ?Triage Vital Signs ?ED Triage Vitals [12/16/21 1535]  ?Enc Vitals Group  ?   BP 109/79  ?   Pulse Rate (!) 106  ?   Resp 17  ?   Temp 97.7 ?F (36.5 ?C)  ?   Temp Source Oral  ?   SpO2 96 %  ?   Weight   ?   Height   ?   Head Circumference   ?   Peak Flow   ?   Pain Score 0  ?   Pain Loc   ?   Pain Edu?   ?   Excl. in [place]?   ? ?No data found. ? ?Updated Vital Signs ?BP 109/79 (BP Location: Left Arm)   Pulse (!) 106   Temp 97.7 ?F (36.5 ?C) (Oral)   Resp 17   LMP 02/27/2019 (Approximate)   SpO2 96%  ? ?Visual Acuity ?Right Eye Distance:    ?Left Eye Distance:   ?Bilateral Distance:   ? ?Right Eye Near:   ?Left Eye Near:    ?Bilateral Near:    ? ?Physical Exam ?Vitals and nursing note reviewed.  ?Constitutional:   ?   General: She is not in acute distress. ?   Appearance: Normal appearance. She is not toxic-appearing.  ?HENT:  ?   Head: Normocephalic and atraumatic.  ?   Mouth/Throat:  ?   Mouth: Mucous membranes are moist.  ?   Pharynx: Oropharynx is clear.  ?Eyes:  ?   General: No scleral icterus. ?   Extraocular Movements: Extraocular movements intact.  ?Cardiovascular:  ?   Rate and Rhythm: Regular rhythm. Tachycardia present.  ?Pulmonary:  ?   Effort: Pulmonary effort is normal. No respiratory distress.  ?   Breath sounds: Normal breath sounds. No wheezing, rhonchi or rales.  ?Abdominal:  ?   General: Abdomen is flat. Bowel sounds are normal. There is no distension.  ?   Palpations: Abdomen is soft.  ?   Tenderness: There is no abdominal tenderness. There is no guarding.  ?Musculoskeletal:  ?   Cervical back: Normal range of motion.  ?Lymphadenopathy:  ?   Cervical: No cervical adenopathy.  ?Skin: ?   General: Skin is warm and dry.  ?   Capillary Refill: Capillary refill takes less than 2 seconds.  ?   Coloration: Skin is not jaundiced or pale.  ?   Findings: No erythema.  ?Neurological:  ?   Mental Status: She is alert and oriented to person, place, and time.  ?Psychiatric:     ?   Mood and Affect: Mood is anxious.     ?   Behavior: Behavior is cooperative.  ? ? ? ?UC Treatments / Results  ?Labs ?(all labs ordered are listed, but only abnormal results are displayed) ?Labs Reviewed - No data to display ? ?EKG ? ? ?Radiology ?No results found. ? ?Procedures ?Procedures (including critical  care time) ? ?Medications Ordered in UC ?Medications - No data to display ? ?Initial Impression / Assessment and Plan / UC Course  ?I have reviewed the triage vital signs and the nursing notes. ? ?Pertinent labs & imaging results that were available during my  care of the patient were reviewed by me and considered in my medical decision making (see chart for details). ? ?  ?Symptoms consistent with viral gastroenteritis.  Continue pushing hydration with water, sugar-free electrolyte solutions.  Can use ondansetron 4 mg under the tongue every 8 hours as needed for nausea/vomiting.  Encourage eating bland diet for the next couple of days, then can return to normal as long as she is feeling better.  If symptoms worsen and she develops nausea/vomiting and unable to keep fluids down, for more than 48 hours, go to emergency room. Will give note for work. ?Final Clinical Impressions(s) / UC Diagnoses  ? ?Final diagnoses:  ?Viral gastroenteritis  ? ?Discharge Instructions   ?None ?  ? ?ED Prescriptions   ? ? Medication Sig Dispense Auth. Provider  ? ondansetron (ZOFRAN-ODT) 4 MG disintegrating tablet Take 1 tablet (4 mg total) by mouth every 8 (eight) hours as needed for nausea or vomiting. 20 tablet Eulogio Bear, NP  ? ?  ? ?PDMP not reviewed this encounter. ?  ?Eulogio Bear, NP ?12/16/21 1612 ? ?

## 2022-03-17 ENCOUNTER — Encounter: Payer: Self-pay | Admitting: Emergency Medicine

## 2022-03-17 ENCOUNTER — Emergency Department
Admission: EM | Admit: 2022-03-17 | Discharge: 2022-03-17 | Discharge status: Against Medical Advice | Payer: 59 | Attending: Emergency Medicine | Admitting: Emergency Medicine

## 2022-03-17 DIAGNOSIS — X58XXXA Exposure to other specified factors, initial encounter: Secondary | ICD-10-CM | POA: Diagnosis not present

## 2022-03-17 DIAGNOSIS — Z5321 Procedure and treatment not carried out due to patient leaving prior to being seen by health care provider: Secondary | ICD-10-CM | POA: Diagnosis not present

## 2022-03-17 DIAGNOSIS — T18128A Food in esophagus causing other injury, initial encounter: Secondary | ICD-10-CM | POA: Insufficient documentation

## 2022-03-17 LAB — BASIC METABOLIC PANEL
Anion gap: 8 (ref 5–15)
BUN: 15 mg/dL (ref 6–20)
CO2: 30 mmol/L (ref 22–32)
Calcium: 9.1 mg/dL (ref 8.9–10.3)
Chloride: 105 mmol/L (ref 98–111)
Creatinine, Ser: 0.62 mg/dL (ref 0.44–1.00)
GFR, Estimated: >60 mL/min (ref >60)
Glucose, Bld: 111 mg/dL — ABNORMAL HIGH (ref 70–99)
Potassium: 3.5 mmol/L (ref 3.5–5.1)
Sodium: 143 mmol/L (ref 135–145)

## 2022-03-17 LAB — CBC WITH DIFFERENTIAL/PLATELET
Abs Immature Granulocytes: 0.01 10*3/uL (ref 0.00–0.07)
Basophils Absolute: 0 10*3/uL (ref 0.0–0.1)
Basophils Relative: 1 %
Eosinophils Absolute: 0.1 10*3/uL (ref 0.0–0.5)
Eosinophils Relative: 1 %
HCT: 39.9 % (ref 36.0–46.0)
Hemoglobin: 13 g/dL (ref 12.0–15.0)
Immature Granulocytes: 0 %
Lymphocytes Relative: 29 %
Lymphs Abs: 1.8 10*3/uL (ref 0.7–4.0)
MCH: 29.1 pg (ref 26.0–34.0)
MCHC: 32.6 g/dL (ref 30.0–36.0)
MCV: 89.3 fL (ref 80.0–100.0)
Monocytes Absolute: 0.4 10*3/uL (ref 0.1–1.0)
Monocytes Relative: 7 %
Neutro Abs: 3.8 10*3/uL (ref 1.7–7.7)
Neutrophils Relative %: 62 %
Platelets: 237 10*3/uL (ref 150–400)
RBC: 4.47 MIL/uL (ref 3.87–5.11)
RDW: 12.7 % (ref 11.5–15.5)
WBC: 6.1 10*3/uL (ref 4.0–10.5)
nRBC: 0 % (ref 0.0–0.2)

## 2022-03-17 MED ORDER — GLUCAGON HCL RDNA (DIAGNOSTIC) 1 MG IJ SOLR
1.0000 mg | Freq: Once | INTRAMUSCULAR | Status: AC
  Administered 2022-03-17: 1 mg via INTRAVENOUS
  Filled 2022-03-17: qty 1

## 2022-03-17 MED ORDER — ONDANSETRON HCL 4 MG/2ML IJ SOLN
4.0000 mg | Freq: Once | INTRAMUSCULAR | Status: AC
  Administered 2022-03-17: 4 mg via INTRAVENOUS
  Filled 2022-03-17: qty 2

## 2022-03-17 NOTE — ED Notes (Signed)
Attempted to move pt from triage 3 to subwait, pt abruptly walked out styating she was going home.  Ambulated out of ED triage area.

## 2022-03-17 NOTE — ED Triage Notes (Signed)
Pt reports eating chicken pot pie for dinner and it went down wrong. States she has had trouble swallowing over the past 6 months that has worsened.

## 2022-03-17 NOTE — ED Notes (Signed)
Pt left ER at this time.  IV removed prior to pt leaving.   Attempted to persuade pt to stay without success.

## 2022-03-17 NOTE — ED Provider Triage Note (Signed)
  Emergency Medicine Provider Triage Evaluation Note  Jill Mcfarland , a 51 y.o.female,  was evaluated in triage.  Pt complains of food bolus impaction.  Reports was eating a chicken pot pie this evening when part of it got stuck in her throat.  She has been spitting up frequently since.  Reports this occurs her quite often, however usually goes down on its own.  Denies any other symptoms at this time.   Review of Systems  Positive: Esophageal food bolus Negative: Denies fever, chest pain, vomiting, shortness of breath  Physical Exam   Vitals:   03/17/22 1825  BP: (!) 146/92  Pulse: (!) 107  Resp: 16  Temp: 98.2 F (36.8 C)  SpO2: 93%   Gen:   Awake, appears uncomfortable. Resp:  Normal effort  MSK:   Moves extremities without difficulty  Other:    Medical Decision Making  Given the patient's initial medical screening exam, the following diagnostic evaluation has been ordered. The patient will be placed in the appropriate treatment space, once one is available, to complete the evaluation and treatment. I have discussed the plan of care with the patient and I have advised the patient that an ED physician or mid-level practitioner will reevaluate their condition after the test results have been received, as the results may give them additional insight into the type of treatment they may need.    Diagnostics: Labs  Treatments: Ondansetron, glucagon   Teodoro Spray, [place] 03/17/22 1836

## 2022-03-19 ENCOUNTER — Ambulatory Visit
Admission: EM | Admit: 2022-03-19 | Discharge: 2022-03-19 | Disposition: A | Discharge status: Home / Self Care | Payer: Commercial Managed Care - HMO | Attending: Emergency Medicine | Admitting: Emergency Medicine

## 2022-03-19 ENCOUNTER — Other Ambulatory Visit: Payer: Self-pay

## 2022-03-19 ENCOUNTER — Emergency Department: Payer: Commercial Managed Care - HMO

## 2022-03-19 ENCOUNTER — Emergency Department: Payer: Commercial Managed Care - HMO | Admitting: Certified Registered"

## 2022-03-19 ENCOUNTER — Encounter
Admission: EM | Disposition: A | Discharge status: Home / Self Care | Payer: Self-pay | Source: Home / Self Care | Attending: Emergency Medicine

## 2022-03-19 DIAGNOSIS — Z20822 Contact with and (suspected) exposure to covid-19: Secondary | ICD-10-CM | POA: Diagnosis not present

## 2022-03-19 DIAGNOSIS — K449 Diaphragmatic hernia without obstruction or gangrene: Secondary | ICD-10-CM | POA: Insufficient documentation

## 2022-03-19 DIAGNOSIS — K259 Gastric ulcer, unspecified as acute or chronic, without hemorrhage or perforation: Secondary | ICD-10-CM | POA: Insufficient documentation

## 2022-03-19 DIAGNOSIS — K3189 Other diseases of stomach and duodenum: Secondary | ICD-10-CM | POA: Insufficient documentation

## 2022-03-19 DIAGNOSIS — F17210 Nicotine dependence, cigarettes, uncomplicated: Secondary | ICD-10-CM | POA: Diagnosis not present

## 2022-03-19 DIAGNOSIS — T18128A Food in esophagus causing other injury, initial encounter: Secondary | ICD-10-CM | POA: Insufficient documentation

## 2022-03-19 DIAGNOSIS — F419 Anxiety disorder, unspecified: Secondary | ICD-10-CM | POA: Diagnosis not present

## 2022-03-19 DIAGNOSIS — K317 Polyp of stomach and duodenum: Secondary | ICD-10-CM | POA: Insufficient documentation

## 2022-03-19 DIAGNOSIS — K222 Esophageal obstruction: Secondary | ICD-10-CM | POA: Insufficient documentation

## 2022-03-19 DIAGNOSIS — X58XXXA Exposure to other specified factors, initial encounter: Secondary | ICD-10-CM | POA: Diagnosis not present

## 2022-03-19 DIAGNOSIS — R634 Abnormal weight loss: Secondary | ICD-10-CM | POA: Insufficient documentation

## 2022-03-19 DIAGNOSIS — J449 Chronic obstructive pulmonary disease, unspecified: Secondary | ICD-10-CM | POA: Insufficient documentation

## 2022-03-19 DIAGNOSIS — F32A Depression, unspecified: Secondary | ICD-10-CM | POA: Insufficient documentation

## 2022-03-19 DIAGNOSIS — K2960 Other gastritis without bleeding: Secondary | ICD-10-CM | POA: Insufficient documentation

## 2022-03-19 DIAGNOSIS — K224 Dyskinesia of esophagus: Secondary | ICD-10-CM | POA: Diagnosis not present

## 2022-03-19 HISTORY — PX: ESOPHAGOGASTRODUODENOSCOPY (EGD) WITH PROPOFOL: SHX5813

## 2022-03-19 LAB — BASIC METABOLIC PANEL
Anion gap: 8 (ref 5–15)
BUN: 29 mg/dL — ABNORMAL HIGH (ref 6–20)
CO2: 26 mmol/L (ref 22–32)
Calcium: 8.5 mg/dL — ABNORMAL LOW (ref 8.9–10.3)
Chloride: 111 mmol/L (ref 98–111)
Creatinine, Ser: 0.63 mg/dL (ref 0.44–1.00)
GFR, Estimated: >60 mL/min (ref >60)
Glucose, Bld: 181 mg/dL — ABNORMAL HIGH (ref 70–99)
Potassium: 3.3 mmol/L — ABNORMAL LOW (ref 3.5–5.1)
Sodium: 145 mmol/L (ref 135–145)

## 2022-03-19 LAB — CBC WITH DIFFERENTIAL/PLATELET
Abs Immature Granulocytes: 0.08 10*3/uL — ABNORMAL HIGH (ref 0.00–0.07)
Basophils Absolute: 0 10*3/uL (ref 0.0–0.1)
Basophils Relative: 0 %
Eosinophils Absolute: 0 10*3/uL (ref 0.0–0.5)
Eosinophils Relative: 0 %
HCT: 38.3 % (ref 36.0–46.0)
Hemoglobin: 12.4 g/dL (ref 12.0–15.0)
Immature Granulocytes: 1 %
Lymphocytes Relative: 7 %
Lymphs Abs: 1 10*3/uL (ref 0.7–4.0)
MCH: 28.7 pg (ref 26.0–34.0)
MCHC: 32.4 g/dL (ref 30.0–36.0)
MCV: 88.7 fL (ref 80.0–100.0)
Monocytes Absolute: 0.8 10*3/uL (ref 0.1–1.0)
Monocytes Relative: 6 %
Neutro Abs: 12.2 10*3/uL — ABNORMAL HIGH (ref 1.7–7.7)
Neutrophils Relative %: 86 %
Platelets: 227 10*3/uL (ref 150–400)
RBC: 4.32 MIL/uL (ref 3.87–5.11)
RDW: 13.1 % (ref 11.5–15.5)
WBC: 14.1 10*3/uL — ABNORMAL HIGH (ref 4.0–10.5)
nRBC: 0 % (ref 0.0–0.2)

## 2022-03-19 LAB — GROUP A STREP BY PCR: Group A Strep by PCR: NOT DETECTED

## 2022-03-19 LAB — SARS CORONAVIRUS 2 BY RT PCR: SARS Coronavirus 2 by RT PCR: NEGATIVE

## 2022-03-19 SURGERY — ESOPHAGOGASTRODUODENOSCOPY (EGD) WITH PROPOFOL
Anesthesia: General

## 2022-03-19 MED ORDER — LIDOCAINE HCL (CARDIAC) PF 100 MG/5ML IV SOSY
PREFILLED_SYRINGE | INTRAVENOUS | Status: DC | PRN
  Administered 2022-03-19: 50 mg via INTRAVENOUS

## 2022-03-19 MED ORDER — DEXMEDETOMIDINE (PRECEDEX) IN NS 20 MCG/5ML (4 MCG/ML) IV SYRINGE
PREFILLED_SYRINGE | INTRAVENOUS | Status: DC | PRN
  Administered 2022-03-19: 12 ug via INTRAVENOUS

## 2022-03-19 MED ORDER — FENTANYL CITRATE (PF) 100 MCG/2ML IJ SOLN
INTRAMUSCULAR | Status: AC
  Filled 2022-03-19: qty 2

## 2022-03-19 MED ORDER — PROPOFOL 10 MG/ML IV BOLUS
INTRAVENOUS | Status: DC | PRN
  Administered 2022-03-19: 130 mg via INTRAVENOUS

## 2022-03-19 MED ORDER — FENTANYL CITRATE (PF) 100 MCG/2ML IJ SOLN
INTRAMUSCULAR | Status: DC | PRN
  Administered 2022-03-19 (×2): 50 ug via INTRAVENOUS

## 2022-03-19 MED ORDER — OMEPRAZOLE 40 MG PO CPDR: 40.0000 mg | DELAYED_RELEASE_CAPSULE | Freq: Two times a day (BID) | ORAL | 1 refills | Status: DC

## 2022-03-19 MED ORDER — MIDAZOLAM HCL 2 MG/2ML IJ SOLN
INTRAMUSCULAR | Status: DC | PRN
  Administered 2022-03-19: 2 mg via INTRAVENOUS

## 2022-03-19 MED ORDER — SUCCINYLCHOLINE CHLORIDE 200 MG/10ML IV SOSY
PREFILLED_SYRINGE | INTRAVENOUS | Status: DC | PRN
  Administered 2022-03-19: 100 mg via INTRAVENOUS

## 2022-03-19 MED ORDER — PROPOFOL 500 MG/50ML IV EMUL
INTRAVENOUS | Status: DC | PRN
  Administered 2022-03-19: 150 ug/kg/min via INTRAVENOUS

## 2022-03-19 MED ORDER — PROPOFOL 10 MG/ML IV BOLUS
INTRAVENOUS | Status: AC
  Filled 2022-03-19: qty 20

## 2022-03-19 MED ORDER — GLUCAGON HCL RDNA (DIAGNOSTIC) 1 MG IJ SOLR
1.0000 mg | Freq: Once | INTRAMUSCULAR | Status: AC
  Administered 2022-03-19: 1 mg via INTRAVENOUS
  Filled 2022-03-19: qty 1

## 2022-03-19 MED ORDER — KETOROLAC TROMETHAMINE 15 MG/ML IJ SOLN
15.0000 mg | Freq: Once | INTRAMUSCULAR | Status: AC
  Administered 2022-03-19: 15 mg via INTRAVENOUS
  Filled 2022-03-19: qty 1

## 2022-03-19 MED ORDER — PANTOPRAZOLE SODIUM 40 MG IV SOLR
40.0000 mg | Freq: Once | INTRAVENOUS | Status: AC
  Administered 2022-03-19: 40 mg via INTRAVENOUS
  Filled 2022-03-19: qty 10

## 2022-03-19 MED ORDER — GLYCOPYRROLATE 0.2 MG/ML IJ SOLN
INTRAMUSCULAR | Status: DC | PRN
  Administered 2022-03-19: .2 mg via INTRAVENOUS

## 2022-03-19 MED ORDER — ONDANSETRON HCL 4 MG/2ML IJ SOLN
INTRAMUSCULAR | Status: DC | PRN
  Administered 2022-03-19: 4 mg via INTRAVENOUS

## 2022-03-19 MED ORDER — SODIUM CHLORIDE 0.9 % IV BOLUS
1000.0000 mL | Freq: Once | INTRAVENOUS | Status: AC
  Administered 2022-03-19: 1000 mL via INTRAVENOUS

## 2022-03-19 MED ORDER — SODIUM CHLORIDE 0.9 % IV SOLN
INTRAVENOUS | Status: DC
Start: 2022-03-19 — End: 2022-03-19

## 2022-03-19 MED ORDER — DEXAMETHASONE SODIUM PHOSPHATE 10 MG/ML IJ SOLN
INTRAMUSCULAR | Status: DC | PRN
  Administered 2022-03-19: 4 mg via INTRAVENOUS

## 2022-03-19 MED ORDER — MIDAZOLAM HCL 2 MG/2ML IJ SOLN
INTRAMUSCULAR | Status: AC
  Filled 2022-03-19: qty 2

## 2022-03-19 NOTE — ED Provider Notes (Signed)
[facility] Provider Note    Event Date/Time   First MD Initiated Contact with Patient 03/19/22 1106     (approximate)   History   Chief Complaint: Dysphagia   HPI  Jill Mcfarland is a 51 y.o. female with no significant past medical history who comes ED complaining of throat pain for the past 2 days, started after eating chicken pot pie, feels like it may have gotten stuck.  She has not been able to swallow her saliva and has had to spit out her secretions over the last 2 days, not able to eat or drink anything.  Denies any fever.  No chest pain or shortness of breath.  Denies history of esophageal stricture, GERD, or other esophageal pathology.     Physical Exam   Triage Vital Signs: ED Triage Vitals  Enc Vitals Group     BP 03/19/22 1024 (!) 151/103     Pulse Rate 03/19/22 1024 (!) 103     Resp 03/19/22 1024 18     Temp 03/19/22 1024 98.6 F (37 C)     Temp Source 03/19/22 1024 Oral     SpO2 03/19/22 1024 97 %     Weight 03/19/22 1024 100 lb (45.4 kg)     Height 03/19/22 1024 '5\' 2"'$  (1.575 m)     Head Circumference --      Peak Flow --      Pain Score 03/19/22 1030 4     Pain Loc --      Pain Edu? --      Excl. in [place]? --     Most recent vital signs: Vitals:   03/19/22 1024 03/19/22 1317  BP: (!) 151/103 129/81  Pulse: (!) 103 (!) 111  Resp: 18 18  Temp: 98.6 F (37 C)   SpO2: 97% 100%    General: Awake, no distress.  CV:  Good peripheral perfusion.  Tachycardia, heart rate 105 Resp:  Normal effort.  Clear to auscultation bilaterally Abd:  No distention.  Soft nontender Other:  Mucous membranes.  Oropharynx normal.  No neck swelling, floor mouth edema, or submental induration.  ED Results / Procedures / Treatments   Labs (all labs ordered are listed, but only abnormal results are displayed) Labs Reviewed  CBC WITH DIFFERENTIAL/PLATELET - Abnormal; Notable for the following components:      Result Value   WBC 14.1 (*)     Neutro Abs 12.2 (*)    Abs Immature Granulocytes 0.08 (*)    All other components within normal limits  BASIC METABOLIC PANEL - Abnormal; Notable for the following components:   Potassium 3.3 (*)    Glucose, Bld 181 (*)    BUN 29 (*)    Calcium 8.5 (*)    All other components within normal limits  SARS CORONAVIRUS 2 BY RT PCR  GROUP A STREP BY PCR     EKG    RADIOLOGY X-ray chest interpreted by me, appears normal.  Radiology report reviewed  X-ray soft tissue neck unremarkable.   PROCEDURES:  Procedures   MEDICATIONS ORDERED IN ED: Medications  sodium chloride 0.9 % bolus 1,000 mL (1,000 mLs Intravenous New Bag/Given 03/19/22 1209)  pantoprazole (PROTONIX) injection 40 mg (40 mg Intravenous Given 03/19/22 1206)  ketorolac (TORADOL) 15 MG/ML injection 15 mg (15 mg Intravenous Given 03/19/22 1207)  glucagon (human recombinant) (GLUCAGEN) injection 1 mg (1 mg Intravenous Given 03/19/22 1206)     IMPRESSION / MDM / ASSESSMENT AND PLAN /  ED COURSE  I reviewed the triage vital signs and the nursing notes.                              Differential diagnosis includes, but is not limited to, esophageal food impaction, GERD, dehydration, AKI, electrolyte abnormality, viral illness  Patient's presentation is most consistent with acute presentation with potential threat to life or bodily function.  Patient presents with unremitting throat pain and p.o. intolerance after eating.  Low suspicious for food impaction.  X-rays and labs are all unremarkable.  Patient given IV fluids Toradol Protonix and glucagon for symptom relief.  Attempted to try sips of soda, but she is still not able to tolerate any liquids by mouth and has to spit out everything that she tries to swallow.  Case discussed with gastroenterology Dr. Virgina Jock who will evaluate for endoscopy.       FINAL CLINICAL IMPRESSION(S) / ED DIAGNOSES   Final diagnoses:  Esophageal obstruction due to food impaction     Rx / DC  Orders   ED Discharge Orders     None        Note:  This document was prepared using Dragon voice recognition software and may include unintentional dictation errors.   Carrie Mew, MD 03/19/22 [phone]

## 2022-03-19 NOTE — Transfer of Care (Signed)
Immediate Anesthesia Transfer of Care Note  Patient: Jill Mcfarland  Procedure(s) Performed: ESOPHAGOGASTRODUODENOSCOPY (EGD) WITH PROPOFOL  Patient Location: PACU and Endoscopy Unit  Anesthesia Type:General  Level of Consciousness: awake  Airway & Oxygen Therapy: Patient Spontanous Breathing and Patient connected to face mask oxygen  Post-op Assessment: Report given to RN  Post vital signs: stable  Last Vitals:  Vitals Value Taken Time  BP 112/90 03/19/22 1609  Temp    Pulse 117 03/19/22 1609  Resp 17 03/19/22 1609  SpO2 97 % 03/19/22 1609    Last Pain:  Vitals:   03/19/22 1459  TempSrc:   PainSc: 3          Complications: No notable events documented.

## 2022-03-19 NOTE — Anesthesia Procedure Notes (Signed)
Procedure Name: Intubation Date/Time: 03/19/2022 3:29 PM  Performed by: Lerry Liner, CRNAPre-anesthesia Checklist: Patient identified, Emergency Drugs available, Suction available and Patient being monitored Patient Re-evaluated:Patient Re-evaluated prior to induction Oxygen Delivery Method: Circle system utilized Preoxygenation: Pre-oxygenation with 100% oxygen Induction Type: IV induction, Cricoid Pressure applied and Rapid sequence Laryngoscope Size: McGraph and 3 Grade View: Grade I Tube type: Oral Tube size: 6.5 mm Number of attempts: 1 Airway Equipment and Method: Stylet and Oral airway Placement Confirmation: ETT inserted through vocal cords under direct vision, positive ETCO2 and breath sounds checked- equal and bilateral Secured at: 20 cm Tube secured with: Tape Dental Injury: Teeth and Oropharynx as per pre-operative assessment

## 2022-03-19 NOTE — Consult Note (Addendum)
Inpatient Consultation   Patient ID: Jill Mcfarland is a 51 y.o. female.  Requesting Provider: Carrie Mew, MD- Emergency Medicine  Date of Admission: 03/19/2022  Date of Consult: 03/19/22   Reason for Consultation: food impaction   Patient's Chief Complaint:   Chief Complaint  Patient presents with   Dysphagia    51 year old Caucasian female with anxiety and depression who presents to the hospital with potential food impaction for which GI is consulted.  Patient ate chicken pot pie approximately 2 days ago for dinner and has not been able to tolerate liquids or solids since.  Presented today for these issues.  She is currently spitting into a suction canister.  Emergency department trialed her on glucagon and ginger ale which the patient did not respond to.  She notes increasing dysphagia to solids and pills over the last year.  She denies any odynophagia.  She has lost approximately 15 to 20 pounds.  Bowel movements have been moving regularly without melena hematochezia diarrhea or constipation  Smokes 1 pack of cigarettes every 2 to 3 days denies tobacco and alcohol use.  Daily BC powders, occasional Aleve.  Only prescription medications are Adderall and Xanax both last taken 2 days ago per the patient.  Denies  Anti-plt agents, and anticoagulants Denies family history of gastrointestinal disease and malignancy Previous Endoscopies: none  Patient provided friend's [name]) name and number for communication [phone]     Past Medical History:  Diagnosis Date   Abscess    Anxiety    History - no current prob per pt.   Bartholin cyst    Bronchitis    Depression    History - no current prob per pt.    Past Surgical History:  Procedure Laterality Date   BARTHOLIN CYST MARSUPIALIZATION  12/03/2011   Procedure: BARTHOLIN CYST MARSUPIALIZATION;  Surgeon: Mora Bellman, MD;  Location: [facility];  Service: Gynecology;  Laterality: Left;  Bartholin Abcess    CARPAL TUNNEL RELEASE     Incison and drainage     Bartholin cyst   LEEP     SVD     x 3   TUBAL LIGATION     WISDOM TOOTH EXTRACTION      Allergies  Allergen Reactions   Tramadol Other (See Comments)    Causes inside of mouth to become raw   Vicodin [Hydrocodone-Acetaminophen] Itching    Family History  Problem Relation Age of Onset   Cancer Mother    Asthma Son     Social History   Tobacco Use   Smoking status: Every Day    Packs/day: 0.50    Years: 24.00    Total pack years: 12.00    Types: Cigarettes   Smokeless tobacco: Never  Vaping Use   Vaping Use: Never used  Substance Use Topics   Alcohol use: No   Drug use: No     Pertinent GI related history and allergies were reviewed with the patient  Review of Systems  Constitutional:  Positive for unexpected weight change (lost 15-20 lbs). Negative for activity change, appetite change, chills, diaphoresis, fatigue and fever.  HENT:  Positive for trouble swallowing (worsening for a year). Negative for voice change.   Respiratory:  Negative for shortness of breath and wheezing.   Cardiovascular:  Negative for chest pain, palpitations and leg swelling.  Gastrointestinal:  Negative for abdominal distention, abdominal pain, anal bleeding, blood in stool, constipation, diarrhea, nausea, rectal pain and vomiting.  Musculoskeletal:  Negative for  arthralgias and myalgias.  Skin:  Negative for color change and pallor.  Neurological:  Negative for dizziness, syncope and weakness.  Psychiatric/Behavioral:  Negative for confusion.   All other systems reviewed and are negative.    Medications Home Medications No current facility-administered medications on file prior to encounter.   Current Outpatient Medications on File Prior to Encounter  Medication Sig Dispense Refill   acetaminophen (TYLENOL) 500 MG tablet Take 1 tablet (500 mg total) by mouth every 6 (six) hours as needed. 30 tablet 0   ALPRAZolam (XANAX) 1 MG  tablet Take 1 mg by mouth 4 (four) times daily as needed.     amphetamine-dextroamphetamine (ADDERALL) 20 MG tablet Take 20 mg by mouth 3 (three) times daily.     gabapentin (NEURONTIN) 300 MG capsule TAKE 1 CAPSULE BY MOUTH 4 TIMES DAILY. 120 capsule 3   gabapentin (NEURONTIN) 600 MG tablet Take 600 mg by mouth 4 (four) times daily.     ondansetron (ZOFRAN-ODT) 4 MG disintegrating tablet Take 1 tablet (4 mg total) by mouth every 8 (eight) hours as needed for nausea or vomiting. 20 tablet 0   sertraline (ZOLOFT) 100 MG tablet Take 100 mg by mouth daily.     Pertinent GI related medications were reviewed with the patient  Inpatient Medications No current facility-administered medications for this encounter.  Current Outpatient Medications:    acetaminophen (TYLENOL) 500 MG tablet, Take 1 tablet (500 mg total) by mouth every 6 (six) hours as needed., Disp: 30 tablet, Rfl: 0   ALPRAZolam (XANAX) 1 MG tablet, Take 1 mg by mouth 4 (four) times daily as needed., Disp: , Rfl:    amphetamine-dextroamphetamine (ADDERALL) 20 MG tablet, Take 20 mg by mouth 3 (three) times daily., Disp: , Rfl:    gabapentin (NEURONTIN) 300 MG capsule, TAKE 1 CAPSULE BY MOUTH 4 TIMES DAILY., Disp: 120 capsule, Rfl: 3   gabapentin (NEURONTIN) 600 MG tablet, Take 600 mg by mouth 4 (four) times daily., Disp: , Rfl:    ondansetron (ZOFRAN-ODT) 4 MG disintegrating tablet, Take 1 tablet (4 mg total) by mouth every 8 (eight) hours as needed for nausea or vomiting., Disp: 20 tablet, Rfl: 0   sertraline (ZOLOFT) 100 MG tablet, Take 100 mg by mouth daily., Disp: , Rfl:       Objective   Vitals:   03/19/22 1024 03/19/22 1030 03/19/22 1317  BP: (!) 151/103  129/81  Pulse: (!) 103  (!) 111  Resp: 18  18  Temp: 98.6 F (37 C)    TempSrc: Oral    SpO2: 97%  100%  Weight: 45.4 kg 47.6 kg   Height: '5\' 2"'$  (1.575 m) '5\' 2"'$  (1.575 m)      Physical Exam Vitals and nursing note reviewed.  Constitutional:      General: She is  not in acute distress.    Appearance: She is ill-appearing. She is not toxic-appearing or diaphoretic.     Comments: Thin appearing, appears older than stated age  HENT:     Head: Normocephalic and atraumatic.     Nose: Nose normal.     Mouth/Throat:     Mouth: Mucous membranes are moist.     Pharynx: Oropharynx is clear.     Comments: Poor dentition Eyes:     General: No scleral icterus.    Extraocular Movements: Extraocular movements intact.  Cardiovascular:     Rate and Rhythm: Regular rhythm. Tachycardia present.     Heart sounds: Normal heart sounds. No murmur  heard.    No friction rub. No gallop.  Pulmonary:     Effort: Pulmonary effort is normal. No respiratory distress.     Breath sounds: No wheezing, rhonchi or rales.     Comments: Diminished bilaterally Abdominal:     General: Abdomen is flat. Bowel sounds are normal. There is no distension.     Palpations: Abdomen is soft.     Tenderness: There is no abdominal tenderness. There is no guarding or rebound.  Musculoskeletal:     Cervical back: Neck supple.     Right lower leg: No edema.     Left lower leg: No edema.  Skin:    General: Skin is warm and dry.     Coloration: Skin is not jaundiced or pale.  Neurological:     General: No focal deficit present.     Mental Status: She is alert and oriented to person, place, and time. Mental status is at baseline.  Psychiatric:     Comments: Anxious, borderline tearful     Laboratory Data Recent Labs  Lab 03/17/22 1846 03/19/22 1136  WBC 6.1 14.1*  HGB 13.0 12.4  HCT 39.9 38.3  PLT 237 227   Recent Labs  Lab 03/17/22 1846 03/19/22 1136  NA 143 145  K 3.5 3.3*  CL 105 111  CO2 30 26  BUN 15 29*  CALCIUM 9.1 8.5*  GLUCOSE 111* 181*   No results for input(s): "INR" in the last 168 hours.  No results for input(s): "LIPASE" in the last 72 hours.      Imaging Studies: DG Neck Soft Tissue  Result Date: 03/19/2022 CLINICAL DATA:  Shortness of breath and  difficulty swallowing. EXAM: NECK SOFT TISSUES - 1+ VIEW COMPARISON:  None Available. FINDINGS: There is no evidence of retropharyngeal soft tissue swelling or epiglottic enlargement. The cervical airway is unremarkable and no radio-opaque foreign body identified. IMPRESSION: Unremarkable soft tissue examination of the neck. Electronically Signed   By: Marijo Sanes M.D.   On: 03/19/2022 12:08   DG Chest 2 View  Result Date: 03/19/2022 CLINICAL DATA:  Chest pain and difficulty breathing. EXAM: CHEST - 2 VIEW COMPARISON:  Chest x-ray and chest CT 07/08/2016 FINDINGS: The cardiac silhouette, mediastinal and hilar contours are within normal limits stable. The lungs are clear of an acute process. No pulmonary lesions or pleural effusions. The bony thorax is intact. IMPRESSION: No acute cardiopulmonary findings. Electronically Signed   By: Marijo Sanes M.D.   On: 03/19/2022 12:06    Assessment:   # Suspected food impaction - dysphagia worsening over the last year - 15-20 lbs weight loss; no odynophagia - unable to tolerate secretions  # Abnormal weight loss- as above  # Anxiety/Depression - only on xanax per patient  # Tobacco dependence  Plan:  Urgent EGD for food impaction today. Will need intubation which has been communicated to the patient. Labs and imaging reviewed Pt will need outpatient colonoscopy given weight loss and has never undergone colonoscopy Recommend she undergo CT Chest and likely Abdomen/Pelvis with her PCP given tobacco hx and weight loss Pt was given glucagon and ginger ale by Emergency Department with no improvement Keep NPO  Esophagogastroduodenoscopy with possible biopsy, control of bleeding, polypectomy, and interventions as necessary has been discussed with the patient/patient representative. Informed consent was obtained from the patient/patient representative after explaining the indication, nature, and risks of the procedure including but not limited to death,  bleeding, perforation, missed neoplasm/lesions, cardiorespiratory compromise, and reaction to  medications. Opportunity for questions was given and appropriate answers were provided. Patient/patient representative has verbalized understanding is amenable to undergoing the procedure.  I personally performed the service.  Management of other medical comorbidities as per primary team  Thank you for allowing [place] to participate in this patient's care. Please don't hesitate to call if any questions or concerns arise.   Annamaria Helling, DO [facility]  Portions of the record may have been created with voice recognition software. Occasional wrong-word or 'sound-a-like' substitutions may have occurred due to the inherent limitations of voice recognition software.  Read the chart carefully and recognize, using context, where substitutions may have occurred.

## 2022-03-19 NOTE — H&P (View-Only) (Signed)
Inpatient Consultation   Patient ID: Jill Mcfarland is a 51 y.o. female.  Requesting Provider: Carrie Mew, MD- Emergency Medicine  Date of Admission: 03/19/2022  Date of Consult: 03/19/22   Reason for Consultation: food impaction   Patient's Chief Complaint:   Chief Complaint  Patient presents with   Dysphagia    51 year old Caucasian female with anxiety and depression who presents to the hospital with potential food impaction for which GI is consulted.  Patient ate chicken pot pie approximately 2 days ago for dinner and has not been able to tolerate liquids or solids since.  Presented today for these issues.  She is currently spitting into a suction canister.  Emergency department trialed her on glucagon and ginger ale which the patient did not respond to.  She notes increasing dysphagia to solids and pills over the last year.  She denies any odynophagia.  She has lost approximately 15 to 20 pounds.  Bowel movements have been moving regularly without melena hematochezia diarrhea or constipation  Smokes 1 pack of cigarettes every 2 to 3 days denies tobacco and alcohol use.  Daily BC powders, occasional Aleve.  Only prescription medications are Adderall and Xanax both last taken 2 days ago per the patient.  Denies  Anti-plt agents, and anticoagulants Denies family history of gastrointestinal disease and malignancy Previous Endoscopies: none  Patient provided friend's [name]) name and number for communication [phone]     Past Medical History:  Diagnosis Date   Abscess    Anxiety    History - no current prob per pt.   Bartholin cyst    Bronchitis    Depression    History - no current prob per pt.    Past Surgical History:  Procedure Laterality Date   BARTHOLIN CYST MARSUPIALIZATION  12/03/2011   Procedure: BARTHOLIN CYST MARSUPIALIZATION;  Surgeon: Mora Bellman, MD;  Location: [facility];  Service: Gynecology;  Laterality: Left;  Bartholin Abcess    CARPAL TUNNEL RELEASE     Incison and drainage     Bartholin cyst   LEEP     SVD     x 3   TUBAL LIGATION     WISDOM TOOTH EXTRACTION      Allergies  Allergen Reactions   Tramadol Other (See Comments)    Causes inside of mouth to become raw   Vicodin [Hydrocodone-Acetaminophen] Itching    Family History  Problem Relation Age of Onset   Cancer Mother    Asthma Son     Social History   Tobacco Use   Smoking status: Every Day    Packs/day: 0.50    Years: 24.00    Total pack years: 12.00    Types: Cigarettes   Smokeless tobacco: Never  Vaping Use   Vaping Use: Never used  Substance Use Topics   Alcohol use: No   Drug use: No     Pertinent GI related history and allergies were reviewed with the patient  Review of Systems  Constitutional:  Positive for unexpected weight change (lost 15-20 lbs). Negative for activity change, appetite change, chills, diaphoresis, fatigue and fever.  HENT:  Positive for trouble swallowing (worsening for a year). Negative for voice change.   Respiratory:  Negative for shortness of breath and wheezing.   Cardiovascular:  Negative for chest pain, palpitations and leg swelling.  Gastrointestinal:  Negative for abdominal distention, abdominal pain, anal bleeding, blood in stool, constipation, diarrhea, nausea, rectal pain and vomiting.  Musculoskeletal:  Negative for  arthralgias and myalgias.  Skin:  Negative for color change and pallor.  Neurological:  Negative for dizziness, syncope and weakness.  Psychiatric/Behavioral:  Negative for confusion.   All other systems reviewed and are negative.    Medications Home Medications No current facility-administered medications on file prior to encounter.   Current Outpatient Medications on File Prior to Encounter  Medication Sig Dispense Refill   acetaminophen (TYLENOL) 500 MG tablet Take 1 tablet (500 mg total) by mouth every 6 (six) hours as needed. 30 tablet 0   ALPRAZolam (XANAX) 1 MG  tablet Take 1 mg by mouth 4 (four) times daily as needed.     amphetamine-dextroamphetamine (ADDERALL) 20 MG tablet Take 20 mg by mouth 3 (three) times daily.     gabapentin (NEURONTIN) 300 MG capsule TAKE 1 CAPSULE BY MOUTH 4 TIMES DAILY. 120 capsule 3   gabapentin (NEURONTIN) 600 MG tablet Take 600 mg by mouth 4 (four) times daily.     ondansetron (ZOFRAN-ODT) 4 MG disintegrating tablet Take 1 tablet (4 mg total) by mouth every 8 (eight) hours as needed for nausea or vomiting. 20 tablet 0   sertraline (ZOLOFT) 100 MG tablet Take 100 mg by mouth daily.     Pertinent GI related medications were reviewed with the patient  Inpatient Medications No current facility-administered medications for this encounter.  Current Outpatient Medications:    acetaminophen (TYLENOL) 500 MG tablet, Take 1 tablet (500 mg total) by mouth every 6 (six) hours as needed., Disp: 30 tablet, Rfl: 0   ALPRAZolam (XANAX) 1 MG tablet, Take 1 mg by mouth 4 (four) times daily as needed., Disp: , Rfl:    amphetamine-dextroamphetamine (ADDERALL) 20 MG tablet, Take 20 mg by mouth 3 (three) times daily., Disp: , Rfl:    gabapentin (NEURONTIN) 300 MG capsule, TAKE 1 CAPSULE BY MOUTH 4 TIMES DAILY., Disp: 120 capsule, Rfl: 3   gabapentin (NEURONTIN) 600 MG tablet, Take 600 mg by mouth 4 (four) times daily., Disp: , Rfl:    ondansetron (ZOFRAN-ODT) 4 MG disintegrating tablet, Take 1 tablet (4 mg total) by mouth every 8 (eight) hours as needed for nausea or vomiting., Disp: 20 tablet, Rfl: 0   sertraline (ZOLOFT) 100 MG tablet, Take 100 mg by mouth daily., Disp: , Rfl:       Objective   Vitals:   03/19/22 1024 03/19/22 1030 03/19/22 1317  BP: (!) 151/103  129/81  Pulse: (!) 103  (!) 111  Resp: 18  18  Temp: 98.6 F (37 C)    TempSrc: Oral    SpO2: 97%  100%  Weight: 45.4 kg 47.6 kg   Height: '5\' 2"'$  (1.575 m) '5\' 2"'$  (1.575 m)      Physical Exam Vitals and nursing note reviewed.  Constitutional:      General: She is  not in acute distress.    Appearance: She is ill-appearing. She is not toxic-appearing or diaphoretic.     Comments: Thin appearing, appears older than stated age  HENT:     Head: Normocephalic and atraumatic.     Nose: Nose normal.     Mouth/Throat:     Mouth: Mucous membranes are moist.     Pharynx: Oropharynx is clear.     Comments: Poor dentition Eyes:     General: No scleral icterus.    Extraocular Movements: Extraocular movements intact.  Cardiovascular:     Rate and Rhythm: Regular rhythm. Tachycardia present.     Heart sounds: Normal heart sounds. No murmur  heard.    No friction rub. No gallop.  Pulmonary:     Effort: Pulmonary effort is normal. No respiratory distress.     Breath sounds: No wheezing, rhonchi or rales.     Comments: Diminished bilaterally Abdominal:     General: Abdomen is flat. Bowel sounds are normal. There is no distension.     Palpations: Abdomen is soft.     Tenderness: There is no abdominal tenderness. There is no guarding or rebound.  Musculoskeletal:     Cervical back: Neck supple.     Right lower leg: No edema.     Left lower leg: No edema.  Skin:    General: Skin is warm and dry.     Coloration: Skin is not jaundiced or pale.  Neurological:     General: No focal deficit present.     Mental Status: She is alert and oriented to person, place, and time. Mental status is at baseline.  Psychiatric:     Comments: Anxious, borderline tearful     Laboratory Data Recent Labs  Lab 03/17/22 1846 03/19/22 1136  WBC 6.1 14.1*  HGB 13.0 12.4  HCT 39.9 38.3  PLT 237 227   Recent Labs  Lab 03/17/22 1846 03/19/22 1136  NA 143 145  K 3.5 3.3*  CL 105 111  CO2 30 26  BUN 15 29*  CALCIUM 9.1 8.5*  GLUCOSE 111* 181*   No results for input(s): "INR" in the last 168 hours.  No results for input(s): "LIPASE" in the last 72 hours.      Imaging Studies: DG Neck Soft Tissue  Result Date: 03/19/2022 CLINICAL DATA:  Shortness of breath and  difficulty swallowing. EXAM: NECK SOFT TISSUES - 1+ VIEW COMPARISON:  None Available. FINDINGS: There is no evidence of retropharyngeal soft tissue swelling or epiglottic enlargement. The cervical airway is unremarkable and no radio-opaque foreign body identified. IMPRESSION: Unremarkable soft tissue examination of the neck. Electronically Signed   By: Marijo Sanes M.D.   On: 03/19/2022 12:08   DG Chest 2 View  Result Date: 03/19/2022 CLINICAL DATA:  Chest pain and difficulty breathing. EXAM: CHEST - 2 VIEW COMPARISON:  Chest x-ray and chest CT 07/08/2016 FINDINGS: The cardiac silhouette, mediastinal and hilar contours are within normal limits stable. The lungs are clear of an acute process. No pulmonary lesions or pleural effusions. The bony thorax is intact. IMPRESSION: No acute cardiopulmonary findings. Electronically Signed   By: Marijo Sanes M.D.   On: 03/19/2022 12:06    Assessment:   # Suspected food impaction - dysphagia worsening over the last year - 15-20 lbs weight loss; no odynophagia - unable to tolerate secretions  # Abnormal weight loss- as above  # Anxiety/Depression - only on xanax per patient  # Tobacco dependence  Plan:  Urgent EGD for food impaction today. Will need intubation which has been communicated to the patient. Labs and imaging reviewed Pt will need outpatient colonoscopy given weight loss and has never undergone colonoscopy Recommend she undergo CT Chest and likely Abdomen/Pelvis with her PCP given tobacco hx and weight loss Pt was given glucagon and ginger ale by Emergency Department with no improvement Keep NPO  Esophagogastroduodenoscopy with possible biopsy, control of bleeding, polypectomy, and interventions as necessary has been discussed with the patient/patient representative. Informed consent was obtained from the patient/patient representative after explaining the indication, nature, and risks of the procedure including but not limited to death,  bleeding, perforation, missed neoplasm/lesions, cardiorespiratory compromise, and reaction to  medications. Opportunity for questions was given and appropriate answers were provided. Patient/patient representative has verbalized understanding is amenable to undergoing the procedure.  I personally performed the service.  Management of other medical comorbidities as per primary team  Thank you for allowing [place] to participate in this patient's care. Please don't hesitate to call if any questions or concerns arise.   Annamaria Helling, DO [facility]  Portions of the record may have been created with voice recognition software. Occasional wrong-word or 'sound-a-like' substitutions may have occurred due to the inherent limitations of voice recognition software.  Read the chart carefully and recognize, using context, where substitutions may have occurred.

## 2022-03-19 NOTE — ED Notes (Signed)
13 yof with a c/c of throat swelling for the past couple of days. The pt advised she can not swallow. The pt was warm, pink, and dry lying in a right sided recumbent position in the bed. The pt was alert and oriented x 4.

## 2022-03-19 NOTE — Interval H&P Note (Signed)
History and Physical Interval Note: Consult Note from 03/19/22  was reviewed and there was no interval change after seeing and examining the patient.  Written consent was obtained from the patient after discussion of risks, benefits, and alternatives. Patient has consented to proceed with Esophagogastroduodenoscopy with possible intervention   03/19/2022 2:52 PM  Jill Mcfarland  has presented today for surgery, with the diagnosis of food impaction.  The various methods of treatment have been discussed with the patient and family. After consideration of risks, benefits and other options for treatment, the patient has consented to  Procedure(s): ESOPHAGOGASTRODUODENOSCOPY (EGD) WITH PROPOFOL (N/A) as a surgical intervention.  The patient's history has been reviewed, patient examined, no change in status, stable for surgery.  I have reviewed the patient's chart and labs.  Questions were answered to the patient's satisfaction.     Annamaria Helling

## 2022-03-19 NOTE — ED Triage Notes (Signed)
Pt in from home due to feeling of throat closing up starting 2 days ago; pt denies allergies, asthma, COPD. Pt reports this happens intermittently where phlegm builds up and she starts "vomiting" because of it and feels like she cannot clear her throat to breathe; phlegm is clear per pt. Pt's resp reg/unlabored, skin dry and calmly sitting in chair. Pt is spitting up thick phlegm every few minutes.

## 2022-03-19 NOTE — ED Notes (Signed)
The pt was given a yanker hooked up to the suction to assist with her heavy sputum.

## 2022-03-19 NOTE — Anesthesia Postprocedure Evaluation (Addendum)
Anesthesia Post Note  Patient: Jill Mcfarland  Procedure(s) Performed: ESOPHAGOGASTRODUODENOSCOPY (EGD) WITH PROPOFOL  Patient location during evaluation: Endoscopy Anesthesia Type: General Level of consciousness: awake and alert Pain management: pain level controlled Vital Signs Assessment: post-procedure vital signs reviewed and stable Respiratory status: spontaneous breathing, nonlabored ventilation, respiratory function stable and patient connected to nasal cannula oxygen Cardiovascular status: blood pressure returned to baseline and stable Postop Assessment: no apparent nausea or vomiting Anesthetic complications: no   No notable events documented.   Last Vitals:  Vitals:   03/19/22 1620 03/19/22 1628  BP: (!) 109/90 116/86  Pulse: (!) 110 (!) 107  Resp: 19 16  Temp:    SpO2: 98% 100%    Last Pain:  Vitals:   03/19/22 1628  TempSrc:   PainSc: 4                  Precious Haws Antione Obar

## 2022-03-19 NOTE — Op Note (Addendum)
[facility] Patient Name: Jill Mcfarland Procedure Date: 03/19/2022 2:43 PM MRN: [id number] Account #: [id number] Date of Birth: [birth year] Admit Type: Inpatient Age: 51 Room: [facility] ENDO ROOM 4 Gender: Female Note Status: Finalized Instrument Name: Altamese Cabal Endoscope [id number] Procedure:             Upper GI endoscopy Indications:           Foreign body in the esophagus Providers:             Annamaria Helling DO, DO Referring MD:          No Local Md, MD (Referring MD) Medicines:             Monitored Anesthesia Care Complications:         No immediate complications. Estimated blood loss:                         Minimal. Procedure:             Pre-Anesthesia Assessment:                        - Prior to the procedure, a History and Physical was                         performed, and patient medications and allergies were                         reviewed. The patient is competent. The risks and                         benefits of the procedure and the sedation options and                         risks were discussed with the patient. All questions                         were answered and informed consent was obtained.                         Patient identification and proposed procedure were                         verified by the physician, the nurse, the                         anesthesiologist, the anesthetist and the technician                         in the endoscopy suite. Mental Status Examination:                         alert and oriented. Airway Examination: normal                         oropharyngeal airway and neck mobility. Respiratory                         Examination: clear to auscultation. CV Examination:  RRR, no murmurs, no S3 or S4. Prophylactic                         Antibiotics: The patient does not require prophylactic                         antibiotics. Prior Anticoagulants: The patient has                          taken no previous anticoagulant or antiplatelet                         agents. ASA Grade Assessment: E - Emergency. After                         reviewing the risks and benefits, the patient was                         deemed in satisfactory condition to undergo the                         procedure. The anesthesia plan was to use general                         anesthesia. Immediately prior to administration of                         medications, the patient was re-assessed for adequacy                         to receive sedatives. The heart rate, respiratory                         rate, oxygen saturations, blood pressure, adequacy of                         pulmonary ventilation, and response to care were                         monitored throughout the procedure. The physical                         status of the patient was re-assessed after the                         procedure.                        After obtaining informed consent, the endoscope was                         passed under direct vision. Throughout the procedure,                         the patient's blood pressure, pulse, and oxygen                         saturations were monitored continuously. The Endoscope  was introduced through the mouth, and advanced to the                         second part of duodenum. The upper GI endoscopy was                         accomplished without difficulty. The patient tolerated                         the procedure well. Findings:      A single 8 to 10 mm sessile polyp with no bleeding was found in the       duodenal bulb. Biopsies were taken with a cold forceps for histology.       Estimated blood loss was minimal.      The exam of the duodenum was otherwise normal.      A small hiatal hernia was present. Estimated blood loss: none.      Diffuse moderate inflammation characterized by erosions and granularity       was found in the entire  examined stomach. Biopsies were taken with a       cold forceps for histology. Estimated blood loss was minimal.      One non-bleeding superficial gastric ulcer with no stigmata of bleeding       was found on the greater curvature of the stomach. The lesion was 2 mm       in largest dimension. Estimated blood loss: none.      Diffuse atrophic mucosa was found in the entire examined stomach.       Estimated blood loss: none.      The Z-line was regular. Estimated blood loss: none.      Abnormal motility was noted in the esophagus. There is spasticity of the       esophageal body. The distal esophagus/lower esophageal sphincter is       open. Estimated blood loss: none.      Food was found in the upper third of the esophagus. Food impaction noted       15cm from incisors Removal of food was accomplished. removed with 3prong       device and roth net with multiple passes. The rest of the food remnants       was able to be moved and lavaged into the stomach. Estimated blood loss       was minimal.      One benign-appearing, intrinsic moderate (circumferential scarring or       stenosis; an endoscope may pass) stenosis was found 15 to 17 cm from the       incisors. This stenosis measured 1.2 cm (inner diameter) x 2 cm (in       length). The stenosis was traversed. Due to inflammation from food       impaction, dilation was not performed. Estimated blood loss was minimal.       Biopsies were taken with a cold forceps for histology. Biopsies from mid       esophagus to rule out eoe Estimated blood loss was minimal. Impression:            - A single duodenal polyp. Biopsied.                        - Small hiatal hernia.                        -  Gastritis. Biopsied.                        - Non-bleeding gastric ulcer with no stigmata of                         bleeding.                        - Gastric mucosal atrophy.                        - Z-line regular.                        - Abnormal  esophageal motility.                        - Food in the upper third of the esophagus. Removal                         was successful.                        - Benign-appearing esophageal stenosis. Biopsied. Recommendation:        - Patient has a contact number available for                         emergencies. The signs and symptoms of potential                         delayed complications were discussed with the patient.                         Return to normal activities tomorrow. Written                         discharge instructions were provided to the patient.                        - Discharge patient to home.                        - Liquids the next 24 hours. soft foods until dilated-                         ie eggs, applesauce. Moisten and chew food well.                        - No aspirin, ibuprofen, naproxen, or other                         non-steroidal anti-inflammatory drugs.                        - Will order omeprazole 40 mg capsules to be taken                         twice a day. You will need to break capsules open and  take for 8 weeks.                        - Await pathology results.                        - Repeat upper endoscopy in 4 weeks for retreatment.                        - Return to GI office at appointment to be scheduled.                        - Need to establish care with primary care provider to                         evaluate for cause of weight loss.                        - The findings and recommendations were discussed with                         the patient.                        - The findings and recommendations were discussed with                         the designated responsible adult. Procedure Code(s):     --- Professional ---                        [phone], Esophagogastroduodenoscopy, flexible,                         transoral; with removal of foreign body(s)                        43239,  Esophagogastroduodenoscopy, flexible,                         transoral; with biopsy, single or multiple Diagnosis Code(s):     --- Professional ---                        K31.7, Polyp of stomach and duodenum                        K44.9, Diaphragmatic hernia without obstruction or                         gangrene                        K29.70, Gastritis, unspecified, without bleeding                        K25.9, Gastric ulcer, unspecified as acute or chronic,                         without hemorrhage or perforation                        K31.89,  Other diseases of stomach and duodenum                        K22.4, Dyskinesia of esophagus                        T18.128A, Food in esophagus causing other injury,                         initial encounter                        K22.2, Esophageal obstruction                        T18.108A, Unspecified foreign body in esophagus                         causing other injury, initial encounter CPT copyright 2019 [employer]. All rights reserved. The codes documented in this report are preliminary and upon coder review may  be revised to meet current compliance requirements. Attending Participation:      I personally performed the entire procedure. Volney American, DO Annamaria Helling DO, DO 03/19/2022 4:16:12 PM This report has been signed electronically. Number of Addenda: 0 Note Initiated On: 03/19/2022 2:43 PM Estimated Blood Loss:  Estimated blood loss was minimal.      [facility]

## 2022-03-19 NOTE — Anesthesia Preprocedure Evaluation (Signed)
Anesthesia Evaluation  Patient identified by MRN, date of birth, ID band Patient awake    Reviewed: Allergy & Precautions, NPO status , Patient's Chart, lab work & pertinent test results  History of Anesthesia Complications Negative for: history of anesthetic complications  Airway Mallampati: III  TM Distance: >3 FB Neck ROM: full    Dental  (+) Missing   Pulmonary COPD, Current Smoker,     + decreased breath sounds      Cardiovascular (-) angina(-) Past MI Normal cardiovascular exam     Neuro/Psych negative neurological ROS  negative psych ROS   GI/Hepatic negative GI ROS, Neg liver ROS,   Endo/Other  negative endocrine ROS  Renal/GU      Musculoskeletal   Abdominal   Peds  Hematology negative hematology ROS (+)   Anesthesia Other Findings Food Bolus  Past Medical History: No date: Abscess No date: Anxiety     Comment:  History - no current prob per pt. No date: Bartholin cyst No date: Bronchitis No date: Depression     Comment:  History - no current prob per pt.  Past Surgical History: 12/03/2011: BARTHOLIN CYST MARSUPIALIZATION     Comment:  Procedure: BARTHOLIN CYST MARSUPIALIZATION;  Surgeon:               Mora Bellman, MD;  Location: [facility];  Service:               Gynecology;  Laterality: Left;  Bartholin Abcess No date: CARPAL TUNNEL RELEASE No date: Incison and drainage     Comment:  Bartholin cyst No date: LEEP No date: SVD     Comment:  x 3 No date: TUBAL LIGATION No date: WISDOM TOOTH EXTRACTION  BMI    Body Mass Index: 19.20 kg/m      Reproductive/Obstetrics negative OB ROS                             Anesthesia Physical Anesthesia Plan  ASA: 3 and emergent  Anesthesia Plan: General ETT and Rapid Sequence   Post-op Pain Management:    Induction: Intravenous  PONV Risk Score and Plan: Ondansetron, Dexamethasone, Midazolam and Treatment may vary  due to age or medical condition  Airway Management Planned: Oral ETT  Additional Equipment:   Intra-op Plan:   Post-operative Plan: Extubation in OR  Informed Consent: I have reviewed the patients History and Physical, chart, labs and discussed the procedure including the risks, benefits and alternatives for the proposed anesthesia with the patient or authorized representative who has indicated his/her understanding and acceptance.     Dental Advisory Given  Plan Discussed with: Anesthesiologist, CRNA and Surgeon  Anesthesia Plan Comments: (Patient consented for risks of anesthesia including but not limited to:  - adverse reactions to medications - damage to eyes, teeth, lips or other oral mucosa - nerve damage due to positioning  - sore throat or hoarseness - Damage to heart, brain, nerves, lungs, other parts of body or loss of life  Patient voiced understanding.)        Anesthesia Quick Evaluation

## 2022-03-20 ENCOUNTER — Encounter: Payer: Self-pay | Admitting: Gastroenterology

## 2022-03-23 LAB — SURGICAL PATHOLOGY

## 2022-04-06 ENCOUNTER — Emergency Department
Admission: EM | Admit: 2022-04-06 | Discharge: 2022-04-06 | Disposition: A | Discharge status: Home / Self Care | Payer: Commercial Managed Care - HMO | Attending: Emergency Medicine | Admitting: Emergency Medicine

## 2022-04-06 ENCOUNTER — Other Ambulatory Visit: Payer: Self-pay

## 2022-04-06 DIAGNOSIS — W57XXXA Bitten or stung by nonvenomous insect and other nonvenomous arthropods, initial encounter: Secondary | ICD-10-CM | POA: Insufficient documentation

## 2022-04-06 DIAGNOSIS — L03115 Cellulitis of right lower limb: Secondary | ICD-10-CM | POA: Diagnosis not present

## 2022-04-06 MED ORDER — CLINDAMYCIN HCL 300 MG PO CAPS: 300.0000 mg | ORAL_CAPSULE | Freq: Four times a day (QID) | ORAL | 0 refills | Status: AC

## 2022-04-06 MED ORDER — OXYCODONE-ACETAMINOPHEN 5-325 MG PO TABS: 1.0000 | ORAL_TABLET | Freq: Four times a day (QID) | ORAL | 0 refills | Status: DC | PRN

## 2022-04-06 MED ORDER — FLUCONAZOLE 150 MG PO TABS: 150.0000 mg | ORAL_TABLET | Freq: Once | ORAL | 0 refills | Status: DC

## 2022-04-06 MED ORDER — FLUCONAZOLE 150 MG PO TABS: 150.0000 mg | ORAL_TABLET | Freq: Once | ORAL | 0 refills | Status: AC

## 2022-04-06 MED ORDER — CLINDAMYCIN HCL 300 MG PO CAPS: 300.0000 mg | ORAL_CAPSULE | Freq: Four times a day (QID) | ORAL | 0 refills | Status: DC

## 2022-04-06 NOTE — ED Provider Notes (Signed)
[facility] Provider Note  Patient Contact: 6:16 PM (approximate)   History   Insect Bite   HPI  Jill Mcfarland is a 51 y.o. female who presents the emergency department complaining of possible infection to the right knee.  Patient reports that she has a either insect bite or boil to the outside of her knee.  The knee joint itself does not hurt.  No fevers or chills.  No streaking to the area.  No history of recurrent skin infections.  No recent antibiotic use.     Physical Exam   Triage Vital Signs: ED Triage Vitals  Enc Vitals Group     BP --      Pulse --      Resp --      Temp --      Temp src --      SpO2 --      Weight 04/06/22 1815 105 lb (47.6 kg)     Height 04/06/22 1815 '5\' 2"'$  (1.575 m)     Head Circumference --      Peak Flow --      Pain Score 04/06/22 1814 10     Pain Loc --      Pain Edu? --      Excl. in [place]? --     Most recent vital signs: Vitals:   04/06/22 1817  BP: (!) 141/130  Pulse: (!) 115  Resp: 18  Temp: 98 F (36.7 C)  SpO2: 100%     General: Alert and in no acute distress.  Cardiovascular:  Good peripheral perfusion Respiratory: Normal respiratory effort without tachypnea or retractions. Lungs CTAB.  Musculoskeletal: Full range of motion to all extremities.  Visualization of the right knee reveals an erythematous area consistent with cellulitis to the outside of the knee.  The joint itself is not edematous, no ballottement.  Full range of motion to the knee.  There is no fluctuance or induration concerning for drainable abscess.  Area measures approximately 3.5 cm in diameter Neurologic:  No gross focal neurologic deficits are appreciated.  Skin:   No rash noted Other:   ED Results / Procedures / Treatments   Labs (all labs ordered are listed, but only abnormal results are displayed) Labs Reviewed - No data to display   EKG     RADIOLOGY    No results found.  PROCEDURES:  Critical Care  performed: No  Procedures   MEDICATIONS ORDERED IN ED: Medications - No data to display   IMPRESSION / MDM / [place] / ED COURSE  I reviewed the triage vital signs and the nursing notes.                              Differential diagnosis includes, but is not limited to, cellulitis, abscess, septic arthritis, gout    Patient's presentation is most consistent with acute presentation with potential threat to life or bodily function.   Patient's diagnosis is consistent with cellulitis lower extremity. Patient with cellulitis to R knee. No evidence of septic arthritis at this time. No drainable fluid collection. Patient will be prescribed antibiotics at this time. Strict return precautions were discussed with the patient. Follow-up with PCP as needed. Patient is given ED precautions to return to the ED for any worsening or new symptoms.        FINAL CLINICAL IMPRESSION(S) / ED DIAGNOSES   Final diagnoses:  Cellulitis  of right lower extremity     Rx / DC Orders   ED Discharge Orders          Ordered    clindamycin (CLEOCIN) 300 MG capsule  4 times daily,   Status:  Discontinued        04/06/22 1818    oxyCODONE-acetaminophen (PERCOCET/ROXICET) 5-325 MG tablet  Every 6 hours PRN        04/06/22 1819    fluconazole (DIFLUCAN) 150 MG tablet   Once,   Status:  Discontinued        04/06/22 1819    fluconazole (DIFLUCAN) 150 MG tablet   Once        04/06/22 1821    clindamycin (CLEOCIN) 300 MG capsule  4 times daily        04/06/22 1821             Note:  This document was prepared using Dragon voice recognition software and may include unintentional dictation errors.   Brynda Peon 04/06/22 Ladoris Gene    Duffy Bruce, MD 04/06/22 [phone]

## 2022-04-06 NOTE — ED Triage Notes (Signed)
Pt to ED via POV from home. Pt reports she noticed her right knee swelling and becoming red around a insect bite. Pt unsure of what may have bit her. Pt denies injury to knee. Pt also has wound on left hip.

## 2022-04-08 ENCOUNTER — Emergency Department (HOSPITAL_COMMUNITY)
Admission: EM | Admit: 2022-04-08 | Discharge: 2022-04-09 | Discharge status: Against Medical Advice | Payer: Commercial Managed Care - HMO | Attending: Emergency Medicine | Admitting: Emergency Medicine

## 2022-04-08 DIAGNOSIS — Z5321 Procedure and treatment not carried out due to patient leaving prior to being seen by health care provider: Secondary | ICD-10-CM | POA: Insufficient documentation

## 2022-04-08 DIAGNOSIS — S80261A Insect bite (nonvenomous), right knee, initial encounter: Secondary | ICD-10-CM | POA: Diagnosis present

## 2022-04-08 DIAGNOSIS — W57XXXA Bitten or stung by nonvenomous insect and other nonvenomous arthropods, initial encounter: Secondary | ICD-10-CM | POA: Diagnosis not present

## 2022-04-09 ENCOUNTER — Other Ambulatory Visit: Payer: Self-pay

## 2022-04-09 ENCOUNTER — Encounter (HOSPITAL_COMMUNITY): Payer: Self-pay | Admitting: Emergency Medicine

## 2022-04-09 LAB — COMPREHENSIVE METABOLIC PANEL
ALT: 17 U/L (ref 0–44)
AST: 22 U/L (ref 15–41)
Albumin: 3.2 g/dL — ABNORMAL LOW (ref 3.5–5.0)
Alkaline Phosphatase: 96 U/L (ref 38–126)
Anion gap: 7 (ref 5–15)
BUN: 9 mg/dL (ref 6–20)
CO2: 30 mmol/L (ref 22–32)
Calcium: 9.1 mg/dL (ref 8.9–10.3)
Chloride: 100 mmol/L (ref 98–111)
Creatinine, Ser: 0.57 mg/dL (ref 0.44–1.00)
GFR, Estimated: >60 mL/min (ref >60)
Glucose, Bld: 110 mg/dL — ABNORMAL HIGH (ref 70–99)
Potassium: 4.3 mmol/L (ref 3.5–5.1)
Sodium: 137 mmol/L (ref 135–145)
Total Bilirubin: 0.3 mg/dL (ref 0.3–1.2)
Total Protein: 7.5 g/dL (ref 6.5–8.1)

## 2022-04-09 LAB — CBC WITH DIFFERENTIAL/PLATELET
Abs Immature Granulocytes: 0.01 10*3/uL (ref 0.00–0.07)
Basophils Absolute: 0 10*3/uL (ref 0.0–0.1)
Basophils Relative: 1 %
Eosinophils Absolute: 0.2 10*3/uL (ref 0.0–0.5)
Eosinophils Relative: 4 %
HCT: 38.4 % (ref 36.0–46.0)
Hemoglobin: 12.1 g/dL (ref 12.0–15.0)
Immature Granulocytes: 0 %
Lymphocytes Relative: 35 %
Lymphs Abs: 1.5 10*3/uL (ref 0.7–4.0)
MCH: 28.5 pg (ref 26.0–34.0)
MCHC: 31.5 g/dL (ref 30.0–36.0)
MCV: 90.4 fL (ref 80.0–100.0)
Monocytes Absolute: 0.3 10*3/uL (ref 0.1–1.0)
Monocytes Relative: 8 %
Neutro Abs: 2.2 10*3/uL (ref 1.7–7.7)
Neutrophils Relative %: 52 %
Platelets: 267 10*3/uL (ref 150–400)
RBC: 4.25 MIL/uL (ref 3.87–5.11)
RDW: 12.7 % (ref 11.5–15.5)
WBC: 4.2 10*3/uL (ref 4.0–10.5)
nRBC: 0 % (ref 0.0–0.2)

## 2022-04-09 LAB — LACTIC ACID, PLASMA: Lactic Acid, Venous: 1.4 mmol/L (ref 0.5–1.9)

## 2022-04-09 NOTE — ED Notes (Signed)
X2 no response for vitals 

## 2022-04-09 NOTE — ED Triage Notes (Signed)
Pt presents with insect bite to right knee. Area around is swollen, red and warm to touch. Pt seen at [facility] for same, started on abx, states symptoms have worsened. Denies fevers.

## 2022-04-10 ENCOUNTER — Emergency Department
Admission: EM | Admit: 2022-04-10 | Discharge: 2022-04-10 | Disposition: A | Discharge status: Home / Self Care | Payer: Commercial Managed Care - HMO | Attending: Emergency Medicine | Admitting: Emergency Medicine

## 2022-04-10 ENCOUNTER — Other Ambulatory Visit: Payer: Self-pay

## 2022-04-10 ENCOUNTER — Emergency Department: Payer: Commercial Managed Care - HMO

## 2022-04-10 DIAGNOSIS — L03115 Cellulitis of right lower limb: Secondary | ICD-10-CM | POA: Diagnosis not present

## 2022-04-10 DIAGNOSIS — L089 Local infection of the skin and subcutaneous tissue, unspecified: Secondary | ICD-10-CM | POA: Diagnosis present

## 2022-04-10 LAB — CBC WITH DIFFERENTIAL/PLATELET
Abs Immature Granulocytes: 0.02 10*3/uL (ref 0.00–0.07)
Basophils Absolute: 0 10*3/uL (ref 0.0–0.1)
Basophils Relative: 1 %
Eosinophils Absolute: 0.1 10*3/uL (ref 0.0–0.5)
Eosinophils Relative: 1 %
HCT: 42.1 % (ref 36.0–46.0)
Hemoglobin: 13.4 g/dL (ref 12.0–15.0)
Immature Granulocytes: 0 %
Lymphocytes Relative: 33 %
Lymphs Abs: 1.8 10*3/uL (ref 0.7–4.0)
MCH: 28 pg (ref 26.0–34.0)
MCHC: 31.8 g/dL (ref 30.0–36.0)
MCV: 87.9 fL (ref 80.0–100.0)
Monocytes Absolute: 0.3 10*3/uL (ref 0.1–1.0)
Monocytes Relative: 6 %
Neutro Abs: 3.2 10*3/uL (ref 1.7–7.7)
Neutrophils Relative %: 59 %
Platelets: 321 10*3/uL (ref 150–400)
RBC: 4.79 MIL/uL (ref 3.87–5.11)
RDW: 12.7 % (ref 11.5–15.5)
WBC: 5.4 10*3/uL (ref 4.0–10.5)
nRBC: 0 % (ref 0.0–0.2)

## 2022-04-10 LAB — COMPREHENSIVE METABOLIC PANEL
ALT: 17 U/L (ref 0–44)
AST: 19 U/L (ref 15–41)
Albumin: 3.6 g/dL (ref 3.5–5.0)
Alkaline Phosphatase: 115 U/L (ref 38–126)
Anion gap: 7 (ref 5–15)
BUN: 10 mg/dL (ref 6–20)
CO2: 31 mmol/L (ref 22–32)
Calcium: 9.4 mg/dL (ref 8.9–10.3)
Chloride: 102 mmol/L (ref 98–111)
Creatinine, Ser: 0.56 mg/dL (ref 0.44–1.00)
GFR, Estimated: >60 mL/min (ref >60)
Glucose, Bld: 108 mg/dL — ABNORMAL HIGH (ref 70–99)
Potassium: 4.6 mmol/L (ref 3.5–5.1)
Sodium: 140 mmol/L (ref 135–145)
Total Bilirubin: 0.2 mg/dL — ABNORMAL LOW (ref 0.3–1.2)
Total Protein: 8.2 g/dL — ABNORMAL HIGH (ref 6.5–8.1)

## 2022-04-10 MED ORDER — SULFAMETHOXAZOLE-TRIMETHOPRIM 800-160 MG PO TABS: 1.0000 | ORAL_TABLET | Freq: Two times a day (BID) | ORAL | 0 refills | Status: DC

## 2022-04-10 MED ORDER — VANCOMYCIN HCL IN DEXTROSE 1-5 GM/200ML-% IV SOLN
1000.0000 mg | Freq: Once | INTRAVENOUS | Status: AC
Start: 2022-04-10 — End: 2022-04-10
  Administered 2022-04-10: 1000 mg via INTRAVENOUS
  Filled 2022-04-10: qty 200

## 2022-04-10 MED ORDER — FLUCONAZOLE 150 MG PO TABS: ORAL_TABLET | ORAL | 0 refills | Status: DC

## 2022-04-10 NOTE — ED Notes (Signed)
See triage note  Presents with pain and redness to right knee  States she was seen couple of days ago  Thinks area is worse   Having less pain but increased drainage

## 2022-04-10 NOTE — ED Triage Notes (Signed)
Pt presents to ED with c/o of R knee wound infection. Pt states seen for the same recently and and given clindamycin. Pt unsure of what wound is from. Pt states she has been taking her ABX as RX'ed. No fever or chills noted.

## 2022-04-10 NOTE — Discharge Instructions (Signed)
Follow up with orthopedics if not better in 3 days Return if worsening

## 2022-04-10 NOTE — ED Provider Notes (Signed)
[facility] Provider Note    Event Date/Time   First MD Initiated Contact with Patient 04/10/22 1436     (approximate)   History   Wound Infection (R knee)   HPI  Jill Mcfarland is a 51 y.o. female with history of abscess cyst, Bartholin cyst, bronchitis, presents to the emergency department with concerns of continued right knee infection.  Patient was given clindamycin on 04/06/2022, was told to come back to the emergency department if not improving.  Patient states the area looks worse than it did a few days ago.  No fever or chills.      Physical Exam   Triage Vital Signs: ED Triage Vitals  Enc Vitals Group     BP 04/10/22 1402 112/79     Pulse Rate 04/10/22 1402 (!) 117     Resp 04/10/22 1402 16     Temp 04/10/22 1402 98.2 F (36.8 C)     Temp Source 04/10/22 1402 Oral     SpO2 04/10/22 1402 94 %     Weight 04/10/22 1403 120 lb (54.4 kg)     Height 04/10/22 1403 '5\' 2"'$  (1.575 m)     Head Circumference --      Peak Flow --      Pain Score 04/10/22 1411 10     Pain Loc --      Pain Edu? --      Excl. in [place]? --     Most recent vital signs: Vitals:   04/10/22 1402  BP: 112/79  Pulse: (!) 117  Resp: 16  Temp: 98.2 F (36.8 C)  SpO2: 94%     General: Awake, no distress.   CV:  Good peripheral perfusion. regular rate and  rhythm Resp:  Normal effort.  Abd:  No distention.   Other:  Skin on the right knee has a open area with tissue inflamed, no actual pus is draining, serosanguineous fluid is oozing, neurovascular is intact, patient can move the knee but does reproduce pain   ED Results / Procedures / Treatments   Labs (all labs ordered are listed, but only abnormal results are displayed) Labs Reviewed  COMPREHENSIVE METABOLIC PANEL - Abnormal; Notable for the following components:      Result Value   Glucose, Bld 108 (*)    Total Protein 8.2 (*)    Total Bilirubin 0.2 (*)    All other components within normal limits   AEROBIC CULTURE W GRAM STAIN (SUPERFICIAL SPECIMEN)  CBC WITH DIFFERENTIAL/PLATELET     EKG     RADIOLOGY X-ray of the right knee    PROCEDURES:   Procedures   MEDICATIONS ORDERED IN ED: Medications  vancomycin (VANCOCIN) IVPB 1000 mg/200 mL premix (0 mg Intravenous Stopped 04/10/22 1634)     IMPRESSION / MDM / [place] / ED COURSE  I reviewed the triage vital signs and the nursing notes.                              Differential diagnosis includes, but is not limited to, cellulitis, septic joint, antibiotic resistance  Patient's presentation is most consistent with acute complicated illness / injury requiring diagnostic workup.   Patient's labs are reassuring at this time, CBC metabolic panel are normal  X-ray of the right knee to assess for osteomyelitis or fluid in the joint  We will give the patient vancomycin 1 g IV as she has  failed outpatient on clindamycin  xray of the right knee independently reviewed and interpreted by me as being negative for osteomyelitis or effusion  Patient's labs were reassuring along with x-rays reassuring, I did offer the patient admission due to failed outpatient therapy.  Patient states she would rather try a different antibiotic and see if the area begins to clear up.  I did place her on Bactrim DS twice daily for 7 days.  Gave her Diflucan in case she gets a yeast infection.  We did a wound culture to assess for bacteria.  She is to return emergency department if worsening, follow-up with her regular doctor or orthopedics if not improving in 3 to 4 days.  She is in agreement with this treatment plan.  Discharged in stable condition.   FINAL CLINICAL IMPRESSION(S) / ED DIAGNOSES   Final diagnoses:  Cellulitis of right lower extremity     Rx / DC Orders   ED Discharge Orders          Ordered    sulfamethoxazole-trimethoprim (BACTRIM DS) 800-160 MG tablet  2 times daily        04/10/22 1641    fluconazole  (DIFLUCAN) 150 MG tablet        04/10/22 1641             Note:  This document was prepared using Dragon voice recognition software and may include unintentional dictation errors.    Versie Starks, PA-C 04/10/22 1733    Carrie Mew, MD 04/11/22 [phone]

## 2022-04-15 LAB — AEROBIC CULTURE W GRAM STAIN (SUPERFICIAL SPECIMEN)

## 2023-07-30 ENCOUNTER — Ambulatory Visit (HOSPITAL_COMMUNITY): Payer: Commercial Managed Care - HMO

## 2023-11-23 ENCOUNTER — Ambulatory Visit: Payer: Medicaid Other | Admitting: Nurse Practitioner

## 2023-11-23 ENCOUNTER — Encounter: Payer: Self-pay | Admitting: Nurse Practitioner

## 2023-11-23 VITALS — BP 99/68 | HR 89 | Temp 99.1°F | Ht 62.0 in | Wt 108.2 lb

## 2023-11-23 DIAGNOSIS — Z13220 Encounter for screening for lipoid disorders: Secondary | ICD-10-CM

## 2023-11-23 DIAGNOSIS — G629 Polyneuropathy, unspecified: Secondary | ICD-10-CM

## 2023-11-23 DIAGNOSIS — M79644 Pain in right finger(s): Secondary | ICD-10-CM

## 2023-11-23 DIAGNOSIS — F419 Anxiety disorder, unspecified: Secondary | ICD-10-CM

## 2023-11-23 DIAGNOSIS — Z1231 Encounter for screening mammogram for malignant neoplasm of breast: Secondary | ICD-10-CM

## 2023-11-23 DIAGNOSIS — Z1321 Encounter for screening for nutritional disorder: Secondary | ICD-10-CM

## 2023-11-23 DIAGNOSIS — H5213 Myopia, bilateral: Secondary | ICD-10-CM

## 2023-11-23 DIAGNOSIS — Z8711 Personal history of peptic ulcer disease: Secondary | ICD-10-CM

## 2023-11-23 DIAGNOSIS — F32A Depression, unspecified: Secondary | ICD-10-CM | POA: Diagnosis not present

## 2023-11-23 DIAGNOSIS — Z1211 Encounter for screening for malignant neoplasm of colon: Secondary | ICD-10-CM

## 2023-11-23 DIAGNOSIS — Z716 Tobacco abuse counseling: Secondary | ICD-10-CM

## 2023-11-23 DIAGNOSIS — Z122 Encounter for screening for malignant neoplasm of respiratory organs: Secondary | ICD-10-CM

## 2023-11-23 NOTE — Progress Notes (Unsigned)
 New Patient Office Visit  Subjective   Patient ID: Jill Mcfarland, female    DOB: [birth year]  Age: 53 y.o. MRN: [id number]  CC:  Chief Complaint  Patient presents with   Establish Care  Discussed the use of a AI scribe software for clinical note transcription with the patient, who gave verbal consent to proceed.   HPI Jill Mcfarland presents to establish care and discuss multiple health concerns.    She has a painful condition in her right hand, particularly affecting her thumb, which has been ongoing for about a year. 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 She has a history of carpal tunnel surgery on the same hand, but the current issue involves significant pain when moving the thumb and difficulty making a fist.  Patient has history of esophageal obstruction in 2023 and had EGD that showed  polyp of stomach and duodenum, gastritis, gastric ulcer and diaphragmatic hernia.   She has been diagnosed with neuropathy in both legs, from the knees down, which has been persistent. She was evaluated for diabetes due to the neuropathy but was not found to be diabetic. She is currently taking gabapentin, which helps with the neuropathy symptoms. She was supposed to repeat endoscopy but  lost follow up.  She is currently under the care of Dr. Milagros Evener, her psychiatrist for anxirt and depression. She states the symptoms are stable with medication.   She has a history of seasonal allergies, which are exacerbated by her work environment at [employer], causing her nose to run excessively. She has tried various treatments, but they have not been effective.  She mentions having a spot on her eye that was identified during an eye exam, but she has not followed up on this issue. She wears contacts for distance vision but needs glasses for reading and has difficulty managing her contacts due to the lack of glasses.  She has a history of smoking, currently  smoking a pack of cigarettes every three days, down from over a pack a day for at least ten years. She does not drink alcohol and uses marijuana infrequently, about once a month.  She experiences occasional constipation, which she manages with Dulcolax and stool softeners. She notes a change in her bowel habits since she stopped drinking coffee, which previously acted as a laxative for her.  Health Maintenance  Topic Date Due   Cervical Cancer Screening (HPV/Pap Cotest)  Never done   Colonoscopy  Never done   Pneumococcal Vaccine 69-45 Years old (2 of 2 - PCV) 09/04/2017   MAMMOGRAM  Never done   Zoster Vaccines- Shingrix (1 of 2) Never done   COVID-19 Vaccine (1 - 2024-25 season) 12/08/2023 (Originally 04/18/2023)   DTaP/Tdap/Td (2 - Td or Tdap) 10/13/2026   Hepatitis C Screening  Completed   HIV Screening  Completed   HPV VACCINES  Aged Out   Meningococcal B Vaccine  Aged Out    There are no preventive care reminders to display for this patient.  Outpatient Encounter Medications as of 11/23/2023  Medication Sig   ALPRAZolam (XANAX) 1 MG tablet Take 1 mg by mouth 4 (four) times daily as needed.   amphetamine-dextroamphetamine (ADDERALL) 20 MG tablet Take 20 mg by mouth 3 (three) times daily.   gabapentin (NEURONTIN) 600 MG tablet Take 600 mg by mouth 4 (four) times daily.   sertraline (ZOLOFT) 100 MG tablet Take 100 mg by mouth daily.   [DISCONTINUED] fluconazole (  DIFLUCAN) 150 MG tablet Take one now and one in a week   [DISCONTINUED] ondansetron (ZOFRAN-ODT) 4 MG disintegrating tablet Take 1 tablet (4 mg total) by mouth every 8 (eight) hours as needed for nausea or vomiting.   [DISCONTINUED] oxyCODONE-acetaminophen (PERCOCET/ROXICET) 5-325 MG tablet Take 1 tablet by mouth every 6 (six) hours as needed for severe pain.   [DISCONTINUED] sulfamethoxazole-trimethoprim (BACTRIM DS) 800-160 MG tablet Take 1 tablet by mouth 2 (two) times daily.   acetaminophen (TYLENOL) 500 MG tablet Take 1  tablet (500 mg total) by mouth every 6 (six) hours as needed. (Patient not taking: Reported on 11/23/2023)   [DISCONTINUED] omeprazole (PRILOSEC) 40 MG capsule Take 1 capsule (40 mg total) by mouth 2 (two) times daily before a meal. Break capsule and place contents in applesauce/yogurt.   No facility-administered encounter medications on file as of 11/23/2023.    Past Medical History:  Diagnosis Date   Abscess    Anxiety    History - no current prob per pt.   Bartholin cyst    Bronchitis    Depression    History - no current prob per pt.    Past Surgical History:  Procedure Laterality Date   BARTHOLIN CYST MARSUPIALIZATION  12/03/2011   Procedure: BARTHOLIN CYST MARSUPIALIZATION;  Surgeon: Catalina Antigua, MD;  Location: [facility];  Service: Gynecology;  Laterality: Left;  Bartholin Abcess   CARPAL TUNNEL RELEASE     ESOPHAGOGASTRODUODENOSCOPY (EGD) WITH PROPOFOL N/A 03/19/2022   Procedure: ESOPHAGOGASTRODUODENOSCOPY (EGD) WITH PROPOFOL;  Surgeon: Jaynie Collins, DO;  Location: [facility];  Service: Gastroenterology;  Laterality: N/A;   Incison and drainage     Bartholin cyst   LEEP     SVD     x 3   TUBAL LIGATION     WISDOM TOOTH EXTRACTION      Family History  Problem Relation Age of Onset   Cancer Mother    COPD Father    Alcohol abuse Sister    Asthma Son     Social History   Socioeconomic History   Marital status: Single    Spouse name: Not on file   Number of children: Not on file   Years of education: Not on file   Highest education level: Not on file  Occupational History   Not on file  Tobacco Use   Smoking status: Every Day    Current packs/day: 0.50    Average packs/day: 0.5 packs/day for 24.0 years (12.0 ttl pk-yrs)    Types: Cigarettes   Smokeless tobacco: Never   Tobacco comments:    1 pack last for 3 days as on 11/23/23  Vaping Use   Vaping status: Never Used  Substance and Sexual Activity   Alcohol use: No   Drug use: Yes    Types:  Marijuana    Comment: once a month   Sexual activity: Yes    Partners: Male  Other Topics Concern   Not on file  Social History Narrative   Lives with friend. Work at [employer]. Has two boys (25 and 27)   Social Drivers of Corporate investment banker Strain: Not on file  Food Insecurity: Not on file  Transportation Needs: Not on file  Physical Activity: Not on file  Stress: Not on file  Social Connections: Not on file  Intimate Partner Violence: Not on file    ROS Negative unless indicated in HPI.      Objective    BP 99/68   Pulse  89   Temp 99.1 F (37.3 C)   Ht 5\' 2"  (1.575 m)   Wt 108 lb 3.2 oz (49.1 kg)   LMP 02/27/2019 (Approximate)   SpO2 98%   BMI 19.79 kg/m   Physical Exam Constitutional:      Appearance: Normal appearance.  HENT:     Right Ear: Tympanic membrane normal.     Left Ear: Tympanic membrane normal.     Mouth/Throat:     Mouth: Mucous membranes are moist.  Eyes:     Conjunctiva/sclera: Conjunctivae normal.     Pupils: Pupils are equal, round, and reactive to light.  Cardiovascular:     Rate and Rhythm: Normal rate and regular rhythm.     Pulses: Normal pulses.     Heart sounds: Normal heart sounds.  Pulmonary:     Effort: Pulmonary effort is normal.     Breath sounds: Normal breath sounds.  Abdominal:     General: Bowel sounds are normal.     Palpations: Abdomen is soft.  Musculoskeletal:     Cervical back: Normal range of motion. No tenderness.     Comments: Right thumb tenderness.  Skin:    General: Skin is warm.     Findings: No bruising.  Neurological:     General: No focal deficit present.     Mental Status: She is alert and oriented to person, place, and time. Mental status is at baseline.  Psychiatric:        Mood and Affect: Mood normal.        Behavior: Behavior normal.        Thought Content: Thought content normal.        Judgment: Judgment normal.         Assessment & Plan:  Anxiety and depression Assessment  & Plan: Stable at present. Followed by Dr. Milagros Evener. -Continue the current medication. -Will get records from psychiatry.   Orders: -     CBC with Differential/Platelet -     Comprehensive metabolic panel with GFR -     TSH  Myopia of both eyes  Screening mammogram, encounter for -     3D Screening Mammogram, Left and Right; Future  Encounter for screening colonoscopy  Encounter for vitamin deficiency screening -     VITAMIN D 25 Hydroxy (Vit-D Deficiency, Fractures)  Screening, lipid -     Lipid panel  Tobacco abuse counseling Assessment & Plan: Smoking history over ten years, reduced to one pack every three days.  -Smoking cessation was discussed, 5-7 minutes was spent of this topic specifically.  - Order placed for lung cancer screening.    Orders: -     Ambulatory Referral for Lung Cancer Scre  Screening for lung cancer -     Ambulatory Referral for Lung Cancer Scre  Pain of right thumb Assessment & Plan: Chronic thumb pain for over a year, worsened by work. History of carpal tunnel surgery. - Refer to specialist for further evaluation.   H/O gastric ulcer Assessment & Plan: H/o of gastric ulcer, gastritis and esophageal obstruction. EGD in 2023, Needs repeat EGD. -Referral sent to GI for EGD and screening colonoscopy.   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 Gabapentin effective.  -Continue gabapentin.       Return in about 1 month (around 12/23/2023) for physical.   Kara Dies, NP

## 2023-11-24 LAB — LIPID PANEL
Cholesterol: 159 mg/dL (ref 0–200)
HDL: 55.6 mg/dL (ref >39.00)
LDL Cholesterol: 88 mg/dL (ref 0–99)
NonHDL: 103.55
Total CHOL/HDL Ratio: 3
Triglycerides: 79 mg/dL (ref 0.0–149.0)
VLDL: 15.8 mg/dL (ref 0.0–40.0)

## 2023-11-24 LAB — CBC WITH DIFFERENTIAL/PLATELET
Basophils Absolute: 0 10*3/uL (ref 0.0–0.1)
Basophils Relative: 0.8 % (ref 0.0–3.0)
Eosinophils Absolute: 0.1 10*3/uL (ref 0.0–0.7)
Eosinophils Relative: 1.9 % (ref 0.0–5.0)
HCT: 40.7 % (ref 36.0–46.0)
Hemoglobin: 13.5 g/dL (ref 12.0–15.0)
Lymphocytes Relative: 48.2 % — ABNORMAL HIGH (ref 12.0–46.0)
Lymphs Abs: 2.6 10*3/uL (ref 0.7–4.0)
MCHC: 33.3 g/dL (ref 30.0–36.0)
MCV: 90 fl (ref 78.0–100.0)
Monocytes Absolute: 0.4 10*3/uL (ref 0.1–1.0)
Monocytes Relative: 7.2 % (ref 3.0–12.0)
Neutro Abs: 2.3 10*3/uL (ref 1.4–7.7)
Neutrophils Relative %: 41.9 % — ABNORMAL LOW (ref 43.0–77.0)
Platelets: 268 10*3/uL (ref 150.0–400.0)
RBC: 4.52 Mil/uL (ref 3.87–5.11)
RDW: 13.3 % (ref 11.5–15.5)
WBC: 5.4 10*3/uL (ref 4.0–10.5)

## 2023-11-24 LAB — VITAMIN D 25 HYDROXY (VIT D DEFICIENCY, FRACTURES): VITD: 16.46 ng/mL — ABNORMAL LOW (ref 30.00–100.00)

## 2023-11-24 LAB — COMPREHENSIVE METABOLIC PANEL WITH GFR
ALT: 19 U/L (ref 0–35)
AST: 22 U/L (ref 0–37)
Albumin: 4 g/dL (ref 3.5–5.2)
Alkaline Phosphatase: 90 U/L (ref 39–117)
BUN: 17 mg/dL (ref 6–23)
CO2: 32 meq/L (ref 19–32)
Calcium: 9.2 mg/dL (ref 8.4–10.5)
Chloride: 102 meq/L (ref 96–112)
Creatinine, Ser: 0.58 mg/dL (ref 0.40–1.20)
GFR: 104.52 mL/min (ref >60.00)
Glucose, Bld: 94 mg/dL (ref 70–99)
Potassium: 3.8 meq/L (ref 3.5–5.1)
Sodium: 142 meq/L (ref 135–145)
Total Bilirubin: 0.2 mg/dL (ref 0.2–1.2)
Total Protein: 7 g/dL (ref 6.0–8.3)

## 2023-11-24 LAB — TSH: TSH: 2.06 u[IU]/mL (ref 0.35–5.50)

## 2023-11-25 DIAGNOSIS — Z716 Tobacco abuse counseling: Secondary | ICD-10-CM | POA: Insufficient documentation

## 2023-11-25 DIAGNOSIS — M79644 Pain in right finger(s): Secondary | ICD-10-CM | POA: Insufficient documentation

## 2023-11-25 DIAGNOSIS — Z8711 Personal history of peptic ulcer disease: Secondary | ICD-10-CM | POA: Insufficient documentation

## 2023-11-25 NOTE — Assessment & Plan Note (Signed)
 Chronic thumb pain for over a year, worsened by work. History of carpal tunnel surgery. - Refer to specialist for further evaluation.

## 2023-11-25 NOTE — Assessment & Plan Note (Signed)
 Stable at present. Followed by Dr. Milagros Evener. -Continue the current medication. -Will get records from psychiatry.

## 2023-11-25 NOTE — Assessment & Plan Note (Addendum)
 Smoking history over ten years, reduced to one pack every three days.  -Smoking cessation was discussed, 5-7 minutes was spent of this topic specifically.  - Order placed for lung cancer screening.

## 2023-11-25 NOTE — Assessment & Plan Note (Signed)
 Peripheral neuropathy in legs from knees down. Gabapentin effective.  -Continue gabapentin.

## 2023-11-25 NOTE — Assessment & Plan Note (Signed)
 H/o of gastric ulcer, gastritis and esophageal obstruction. EGD in 2023, Needs repeat EGD. -Referral sent to GI for EGD and screening colonoscopy.

## 2023-11-26 ENCOUNTER — Telehealth: Payer: Self-pay

## 2023-11-26 NOTE — Telephone Encounter (Signed)
 Spoke to pt she stated hat her thumb was swollen and needs a referral the referral has been placed and she wanted to be seen sooner than waiting for the referral office to reach out so I informed her that she could go to [facility] to their walk in clinic number was provided and walk in hours as well. Pt stated that she would call to be certain of hours. Pt also stated that she needed a note for work on 11/25/23. Spoke to [place] and she stated that it was ok to provide work note for pt. Work note sent via my chart

## 2023-11-26 NOTE — Telephone Encounter (Signed)
 Requested records from Dr. Roel Cluck office at [phone] and phone number [phone].

## 2023-11-26 NOTE — Telephone Encounter (Signed)
 Please inform pt the referral has been placed for specialist and she would hear back in a week for thumb pain. If she has acute symptoms need to be evaluated.

## 2023-11-26 NOTE — Telephone Encounter (Signed)
 Copied from CRM [id number]. Topic: Referral - Request for Referral >> Nov 26, 2023  3:17 PM Ann Held wrote: Did the patient discuss referral with their provider in the last year? Yes (If No - schedule appointment) (If Yes - send message)  Appointment offered? No  Type of order/referral and detailed reason for visit: Swollen Thumb   Preference of office, provider, location: No  If referral order, have you been seen by this specialty before? No (If Yes, this issue or another issue? When? Where?  Can we respond through MyChart? Yes

## 2023-11-29 ENCOUNTER — Telehealth: Payer: Self-pay

## 2023-11-29 ENCOUNTER — Telehealth: Payer: Self-pay | Admitting: Nurse Practitioner

## 2023-11-29 NOTE — Telephone Encounter (Signed)
 Copied from CRM [id number]. Topic: Medical Record Request - Other >> Nov 29, 2023  9:32 AM Annalee Kiang wrote: Reason for CRM:Dr.Ruth's office states they received a visit summary for the pt for 04/08 on 04/11 there is a note that states please send pts record. They want to know if you guys are requesting or releasing the pts chart? Please advise Callback # [phone] Isa Manuel

## 2023-11-29 NOTE — Telephone Encounter (Signed)
 Called Jill Mcfarland to let her know that you are requesting records on this Patient and she states we need to fax a paper release with the Patient signature on it to them and the provider will review it and then decide if he will  release the records to you. Patient needs to come in and sign.

## 2023-11-29 NOTE — Telephone Encounter (Signed)
 Called Jill Mcfarland's office regarding the release of medical records and they state they have to have a signed medical release of records from the Patient faxed to their office then he will review and decide whether to release the records or not. Then sent Patient a my chart message requesting she come by the office to sign the release of medical records sheet.

## 2023-12-06 ENCOUNTER — Encounter: Payer: Self-pay | Admitting: Nurse Practitioner

## 2024-01-04 ENCOUNTER — Encounter: Admitting: Nurse Practitioner

## 2024-01-04 ENCOUNTER — Telehealth: Payer: Self-pay

## 2024-01-04 NOTE — Telephone Encounter (Signed)
 I called patient to let her know that Tona Francis, NP, has had an emergency and we need to reschedule her 2:40pm visit.  Patient's mobile voicemail is full.  I called her home number and the person who answered states patient no longer lives there and they gave me her cell phone number, which matches the one we have on file for her.  I sent a message to patient via text/email.  [id number] - when patient calls back, please help her reschedule her 01/04/2024 visit with Tona Francis, NP.

## 2024-01-14 ENCOUNTER — Other Ambulatory Visit: Payer: Self-pay | Admitting: Medical Genetics

## 2024-01-14 ENCOUNTER — Encounter: Payer: Self-pay | Admitting: Nurse Practitioner

## 2024-01-14 NOTE — Telephone Encounter (Signed)
 Called Patient to let her know that we can not write a work note if we have not seen her. Tona Francis referred her to [facility] but she went to [facility] and got an injection. I advised that they are taking care of her hand so she needs to contact them to let them know. Patient was hollering on the phone then got mad and hung up.

## 2024-01-14 NOTE — Telephone Encounter (Signed)
 Please call pt that since we have not seen her we would not able to provide work note.

## 2024-01-19 ENCOUNTER — Other Ambulatory Visit

## 2024-01-31 ENCOUNTER — Telehealth: Payer: Self-pay | Admitting: Nurse Practitioner

## 2024-01-31 NOTE — Telephone Encounter (Signed)
 Tona Francis, NP will not be in the office for your scheduled visit on 02/04/24. Please give the office a call back at [phone] and we will reschedule your appointment.  Thank you  Could not leave message, vm is full. [facility]

## 2024-02-03 ENCOUNTER — Telehealth: Payer: Self-pay

## 2024-02-03 NOTE — Telephone Encounter (Signed)
 Called Patient to find out where she would like to have her Mammogram but her voicemail box is Full.

## 2024-02-04 ENCOUNTER — Encounter: Admitting: Nurse Practitioner

## 2024-02-10 ENCOUNTER — Encounter: Admitting: Nurse Practitioner

## 2024-04-28 ENCOUNTER — Other Ambulatory Visit (HOSPITAL_COMMUNITY): Payer: Self-pay

## 2024-04-28 MED ORDER — AMPHETAMINE-DEXTROAMPHETAMINE 20 MG PO TABS
20.0000 mg | ORAL_TABLET | Freq: Three times a day (TID) | ORAL | 0 refills | Status: AC
  Filled 2024-04-28: qty 90, 30d supply, fill #0

## 2024-04-29 ENCOUNTER — Other Ambulatory Visit (HOSPITAL_COMMUNITY): Payer: Self-pay

## 2024-05-04 ENCOUNTER — Ambulatory Visit (HOSPITAL_COMMUNITY): Admission: EM | Admit: 2024-05-04 | Discharge: 2024-05-04 | Discharge status: Against Medical Advice

## 2024-05-04 NOTE — ED Notes (Signed)
No answer from lobby  

## 2024-05-26 ENCOUNTER — Ambulatory Visit: Admitting: Nurse Practitioner

## 2024-06-12 ENCOUNTER — Ambulatory Visit: Payer: Self-pay

## 2024-06-12 NOTE — Telephone Encounter (Signed)
 FYI Only or Action Required?: FYI only for provider.  Patient was last seen in primary care on 11/23/2023 by Vincente Saber, NP.  Called Nurse Triage reporting finger swelling.  Symptoms began several months ago.  Interventions attempted: Nothing.  Symptoms are: gradually worsening.  Triage Disposition: See PCP Within 2 Weeks  Patient/caregiver understands and will follow disposition?: Yes Reason for Disposition  Nursing judgment or information in reference  Answer Assessment - Initial Assessment Questions Patient states would like to see orthopedic  1. REASON FOR CALL: What is your main concern right now?     Thumbs are swelling and painful  2. ONSET: When did the swelling start?     Right hand been like this a year, left started a few months ago  3. FUNCTIONAL IMPAIRMENT: How have things been going for you overall? Have you had more difficulty than usual doing your normal daily activities? (e.g., self-care, school, work, interactions)     Yes, hard to write, or pick up packages at work  Protocols used: No Guideline Available-A-AH  Copied from KEYSPAN #[id number]. Topic: Clinical - Red Word Triage >> Jun 12, 2024  1:11 PM China J wrote: [facility] that prompted transfer to Nurse Triage: Patient's thumbs are swollen. She would like an appointment with her provider to speak about receiving an appointment with orthopedics.

## 2024-06-12 NOTE — Telephone Encounter (Signed)
 Patient is scheduled on 06/15/24.

## 2024-06-15 ENCOUNTER — Ambulatory Visit: Payer: Self-pay

## 2024-06-15 ENCOUNTER — Ambulatory Visit (INDEPENDENT_AMBULATORY_CARE_PROVIDER_SITE_OTHER): Payer: Self-pay | Admitting: Nurse Practitioner

## 2024-06-15 DIAGNOSIS — Z91199 Patient's noncompliance with other medical treatment and regimen due to unspecified reason: Secondary | ICD-10-CM

## 2024-06-15 NOTE — Progress Notes (Signed)
Patient did not show for the scheduled appointment.

## 2024-06-15 NOTE — Telephone Encounter (Signed)
 Noted

## 2024-06-15 NOTE — Telephone Encounter (Signed)
 Scheduled to see Dr. Onesimo on 06/19/2024.

## 2024-06-15 NOTE — Telephone Encounter (Signed)
 FYI Only or Action Required?: FYI only for provider: appointment scheduled on 06/19/2024 1 PM.  Patient was last seen in primary care on 11/23/2023 by Vincente Saber, NP.  Called Nurse Triage reporting Thumb Pain.  Symptoms began several months ago.  Interventions attempted: Rest, hydration, or home remedies.  Symptoms are: unchanged.  Triage Disposition: See PCP Within 2 Weeks  Patient/caregiver understands and will follow disposition?: Yes  Patient missed her appointment that was scheduled for today. Reports bilateral thumb pain.   Copied from CRM #[id number]. Topic: Clinical - Red Word Triage >> Jun 15, 2024 11:34 AM Victoria A wrote: [facility] that prompted transfer to Nurse Triage: Patient missed appointment this morning -still having pain and swelling in both thumbs Reason for Disposition  Finger pain is a chronic symptom (recurrent or ongoing AND present > 4 weeks)  Answer Assessment - Initial Assessment Questions 1. ONSET: When did the pain start?      Chronic pain to thumbs 2. LOCATION and RADIATION: Where is the pain located?  (e.g., fingertip, around nail, joint, entire      Entire thumb 3. SEVERITY: How bad is the pain? What does it keep you from doing?   (Scale 1-10; or mild, moderate, severe)     8 out of 10 4. APPEARANCE: What does the finger look like? (e.g., redness, swelling, bruising, pallor)     swelling 5. WORK OR EXERCISE: Has there been any recent work or exercise that involved this part (i.e., fingers or hand) of the body?     no 6. CAUSE: What do you think is causing the pain?     unsure 7. AGGRAVATING FACTORS: What makes the pain worse? (e.g., using computer)     Job makes the pain worse 8. OTHER SYMPTOMS: Do you have any other symptoms? (e.g., fever, neck pain, numbness)     No other symptoms.  Protocols used: Finger Pain-A-AH

## 2024-06-16 ENCOUNTER — Other Ambulatory Visit: Payer: Self-pay | Admitting: Medical Genetics

## 2024-06-16 DIAGNOSIS — Z006 Encounter for examination for normal comparison and control in clinical research program: Secondary | ICD-10-CM

## 2024-06-19 ENCOUNTER — Encounter: Payer: Self-pay | Admitting: Nurse Practitioner

## 2024-06-19 ENCOUNTER — Ambulatory Visit: Admitting: Internal Medicine

## 2024-06-21 ENCOUNTER — Ambulatory Visit: Admitting: Internal Medicine

## 2024-06-21 ENCOUNTER — Encounter: Payer: Self-pay | Admitting: Internal Medicine

## 2024-06-21 VITALS — BP 136/82 | HR 104 | Temp 98.4°F | Ht 62.0 in | Wt 105.0 lb

## 2024-06-21 DIAGNOSIS — H18892 Other specified disorders of cornea, left eye: Secondary | ICD-10-CM | POA: Diagnosis not present

## 2024-06-21 DIAGNOSIS — M79644 Pain in right finger(s): Secondary | ICD-10-CM

## 2024-06-21 DIAGNOSIS — M79645 Pain in left finger(s): Secondary | ICD-10-CM

## 2024-06-21 NOTE — Patient Instructions (Signed)
  VISIT SUMMARY: Today, we discussed your chronic thumb pain and other symptoms. You have been experiencing severe pain in your right thumb and less severe pain in your left thumb, which affects your work. We also addressed your eye irritation, likely caused by sleeping in contact lenses.  YOUR PLAN: -CHRONIC RIGHT THUMB PAIN AND BILATERAL THUMB OSTEOARTHRITIS: You have chronic pain in your right thumb and osteoarthritis in both thumbs, which is a condition where the cartilage in the joints wears down over time. We have ordered x-rays to assess the extent of the osteoarthritis and referred you to an orthopedic specialist for further evaluation. For pain management, you can use Voltaren gel up to four times a day.  -TRIGGER FINGER, LEFT THUMB: Trigger finger is a condition where your thumb catches or locks when you bend it. We have referred you to an orthopedic specialist for further evaluation and management.  -CONTACT LENS-RELATED EYE IRRITATION, RIGHT EYE: Your eye irritation is likely due to sleeping in your contact lenses. To prevent further irritation or infection, avoid sleeping in your contacts and consider getting glasses. We have referred you to an ophthalmologist for further evaluation of your eye irritation and the spot behind your eye.  INSTRUCTIONS: Please follow up with the orthopedic specialist for your thumb pain and trigger finger. Additionally, schedule an appointment with the ophthalmologist for your eye irritation and further evaluation of the spot behind your eye. Use Voltaren gel up to four times a day for thumb pain management and avoid sleeping in your contact lenses.                      Contains text generated by Abridge.                                 Contains text generated by Abridge.

## 2024-06-21 NOTE — Assessment & Plan Note (Signed)
-   Patient with pain in bilateral thumbs.  She has a history of chronic right thumb pain for which she was referred to orthopedics this year.  She did receive 1 steroid injection for this but refused the other. 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  No swelling or erythema noted - Will refer back to [facility] (Dr. Francisco) for further evaluation - Will obtain x-rays of both right and left thumbs - Recommend Voltaren gel to help with the pain in addition to the Tylenol  as well as occasional ibuprofen  - No further workup at this time

## 2024-06-21 NOTE — Progress Notes (Signed)
 Acute Office Visit  Subjective:     Patient ID: Jill Mcfarland, female    DOB: 1971-03-08, 53 y.o.   MRN: [id number]  Chief Complaint  Patient presents with   Acute Visit    Bilateral thumb pain 8/10    Discussed the use of AI scribe software for clinical note transcription with the patient, who gave verbal consent to proceed.  History of Present Illness Jill Mcfarland is a 53 year old female who presents with chronic thumb pain.  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[employer], requiring frequent use of thumbs - Right thumb received one corticosteroid injection from orthopedics; second injection declined as patient was not prepared for that - Analgesics including Tylenol  and ibuprofen  taken once daily, providing mild pain relief - Left thumb exhibits difficulty straightening once bent described as 'popping in and out of place' - No prior thumb x-rays performed  Ocular symptoms - Woke up with eye matted and irritated after sleeping in contact lenses - Symptom improves with rest - No discharge or pus from the eye - History of a spot behind the eye noted on optometry visit this year.  They told her this requires further evaluation by ophthalmology    Review of Systems  Constitutional: Negative.   HENT: Negative.    Eyes:        Left eye redness and irritation  Respiratory: Negative.    Cardiovascular: Negative.   Musculoskeletal:        Bilateral thumb pain-present in the right for years and now worsening.  Newer pain in the left thumb with catching of the finger when she bends it  Neurological: Negative.   Psychiatric/Behavioral: Negative.          Objective:     BP 136/82   Pulse (!) 104   Temp 98.4 F (36.9 C)   Ht 5' 2 (1.575 m)   Wt 105 lb (47.6 kg)   LMP 02/27/2019 (Approximate)   SpO2 95%   BMI 19.20 kg/m    Physical Exam Constitutional:      Appearance: Normal appearance.  HENT:     Head: Normocephalic and atraumatic.  Cardiovascular:     Rate and Rhythm: Normal rate and regular rhythm.     Heart sounds: Normal heart sounds.  Pulmonary:     Effort: Pulmonary effort is normal.     Breath sounds: Normal breath sounds. No wheezing, rhonchi or rales.  Abdominal:     General: Bowel sounds are normal. There is no distension.     Palpations: Abdomen is soft.     Tenderness: There is no abdominal tenderness. There is no guarding or rebound.  Musculoskeletal:     Comments: Tenderness to palpation over entire right thumb worse at the first MCP joint as well as the interphalangeal joint.  No swelling or erythema noted  Mild tenderness over the left thumb at the interphalangeal joint.  No swelling or erythema noted.  Patient with possible trigger finger in the left thumb (difficulty straining left thumb with popping sensation when it straightens)  Neurological:     Mental Status: She is alert.  Psychiatric:  Mood and Affect: Mood normal.        Behavior: Behavior normal.     No results found for any visits on 06/21/24.      Assessment & Plan:   Problem List Items Addressed This Visit       Other   Bilateral thumb pain - Primary   - Patient with pain in bilateral thumbs.  She has a history of chronic right thumb pain for which she was referred to orthopedics this year.  She did receive 1 steroid injection for this but refused the other. 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  No  swelling or erythema noted - Will refer back to [facility] (Dr. Francisco) for further evaluation - Will obtain x-rays of both right and left thumbs - Recommend Voltaren gel to help with the pain in addition to the Tylenol  as well as occasional ibuprofen  - No further workup at this time       Relevant Orders   DG Finger Thumb Left   DG Finger Thumb Right   Ambulatory referral to Orthopedic Surgery   Corneal erythema of left eye   - Patient states she woke up this morning with left eye irritation and matting -She fell asleep with her contact lenses and took the left contact lens out today -On exam, mild erythema noted over the left eye with extraocular movements intact.  No pain with EOM.  Mild watering noted from the left eye.  Patient states that symptoms have improved since removing the contact in the morning.  No purulent discharge. -I suspect patient has conjunctival irritation from the more contact lenses in overnight -Patient cautioned against doing this and she states that she will obtain glasses to sleep in at night -Patient to follow-up with ophthalmology (will call [facility]) if symptoms persist.  She states that she also was told by an optometrist that she has a spot in her eye that needs to be further evaluated by ophthalmology.  Recommended follow-up with them -Return precautions given to the patient -No further workup at this time       No orders of the defined types were placed in this encounter.   No follow-ups on file.  Daryan Cagley, MD

## 2024-06-21 NOTE — Assessment & Plan Note (Addendum)
-   Patient states she woke up this morning with left eye irritation and matting -She fell asleep with her contact lenses and took the left contact lens out today -On exam, mild erythema noted over the left eye with extraocular movements intact.  No pain with EOM.  Mild watering noted from the left eye.  Patient states that symptoms have improved since removing the contact in the morning.  No purulent discharge. -I suspect patient has conjunctival irritation from the more contact lenses in overnight -Patient cautioned against doing this and she states that she will obtain glasses to sleep in at night -Patient to follow-up with ophthalmology (will call [facility]) if symptoms persist.  She states that she also was told by an optometrist that she has a spot in her eye that needs to be further evaluated by ophthalmology.  Recommended follow-up with them -Return precautions given to the patient -No further workup at this time

## 2024-07-28 ENCOUNTER — Ambulatory Visit
Admission: RE | Admit: 2024-07-28 | Discharge: 2024-07-28 | Disposition: A | Source: Ambulatory Visit | Attending: Internal Medicine

## 2024-07-28 ENCOUNTER — Telehealth: Payer: Self-pay

## 2024-07-28 ENCOUNTER — Ambulatory Visit
Admission: RE | Admit: 2024-07-28 | Discharge: 2024-07-28 | Disposition: A | Attending: Internal Medicine | Admitting: Internal Medicine

## 2024-07-28 DIAGNOSIS — M79644 Pain in right finger(s): Secondary | ICD-10-CM

## 2024-07-28 NOTE — Telephone Encounter (Signed)
 This Patient was seen in the office on 06/21/24 for bilateral thumb pain. You ordered x rays which she just went and got today and now she is calling saying her thumbs are bothering her really bad and she can not go back to work today. Patient wants you to write a work note and I explained that work notes are usually provided if the Patient is seen in the office. Patient states well your office sent me to get these x rays.

## 2024-07-28 NOTE — Telephone Encounter (Signed)
 Copied from CRM #[id number]. Topic: General - Other >> Jul 28, 2024  3:24 PM Jill Mcfarland wrote: Reason for CRM: Patient called in wanting to know if she could get a doctor note for work for today, would like a call back

## 2024-07-31 NOTE — Telephone Encounter (Signed)
 Called Patient regarding Dr. Yates message and the Patient got an attitude and starting yelling that she is not going back to [facility] and she told him that due to they think she is a child and refused treatment and she did not she just ask that they do one hand injection at a time. Patient stated she is supposed to be a Patient here so why does she need to go to [facility]. Patient was talking over me and started saying just give her the xray results because they said they were sending them right over. I let the Patient know the x ray results are not back yet and she started saying well they said they were sending them right over and I ftried to explain but she was not listening. Patient then started yelling about she is not on medicaid like some of these and we are her doctor or supposed to be so we need to treat her. She said she pays for insurance through her job which is [employer] and by the way she works everyday and pays her own way. Patient then said I was wasting her time and mine and that she does not know about mine but her time is precious and valuable. I was trying to explain that our office can not write her a work note when she was seen for the issue on 06/21/24 Patient just kept getting more upset. Patient stated do not call back until you have my x ray results and have a and hung up.

## 2024-08-01 NOTE — Telephone Encounter (Signed)
 Noted

## 2024-08-02 ENCOUNTER — Ambulatory Visit: Payer: Self-pay | Admitting: Internal Medicine

## 2024-08-02 ENCOUNTER — Telehealth: Payer: Self-pay

## 2024-08-02 DIAGNOSIS — M79644 Pain in right finger(s): Secondary | ICD-10-CM

## 2024-08-02 NOTE — Telephone Encounter (Signed)
 Copied from CRM [id number]. Topic: Clinical - Lab/Test Results >> Aug 02, 2024  3:16 PM Nessti S wrote: Reason for CRM: pt called to receive xray results. She would like a call back soon results are available.

## 2024-08-02 NOTE — Telephone Encounter (Signed)
 Called Patient to let her know that her x ray results have not been finalized. Patient states some one is lying to her because the place where she had the x rays said the x rays would be sent directly to the provider ASAP.

## 2024-08-02 NOTE — Telephone Encounter (Signed)
 Called [facility] to try to get her bilateral thumb x rays that was done on 07/28/24 read ASAP.

## 2024-08-15 NOTE — Progress Notes (Signed)
 Noted

## 2024-08-15 NOTE — Telephone Encounter (Unsigned)
 Copied from CRM [id number]. Topic: Referral - Question >> Aug 15, 2024  1:05 PM Robinson H wrote: Reason for CRM: Patient is calling regarding referral to Orthopedic doctor, states she received a letter from [facility] and states she told provider that she previously had issues with them and didn't want to go back. It's 2 referrals in system with same contact information for [facility] only difference in the notes one says [facility] and one says [facility], please reach out to patient to clarify, thanks.  Joen [phone]
